# Patient Record
Sex: Female | Born: 1955
Health system: Southern US, Community
[De-identification: ages and names within clinical notes are randomized; demographics above are authoritative.]

## PROBLEM LIST (undated history)

## (undated) DIAGNOSIS — D1800 Hemangioma unspecified site: Secondary | ICD-10-CM

## (undated) DIAGNOSIS — T7840XA Allergy, unspecified, initial encounter: Secondary | ICD-10-CM

## (undated) DIAGNOSIS — D649 Anemia, unspecified: Secondary | ICD-10-CM

## (undated) DIAGNOSIS — T4145XA Adverse effect of unspecified anesthetic, initial encounter: Secondary | ICD-10-CM

## (undated) DIAGNOSIS — E785 Hyperlipidemia, unspecified: Secondary | ICD-10-CM

## (undated) DIAGNOSIS — T8859XA Other complications of anesthesia, initial encounter: Secondary | ICD-10-CM

## (undated) DIAGNOSIS — A6920 Lyme disease, unspecified: Secondary | ICD-10-CM

## (undated) DIAGNOSIS — E213 Hyperparathyroidism, unspecified: Secondary | ICD-10-CM

## (undated) DIAGNOSIS — I10 Essential (primary) hypertension: Secondary | ICD-10-CM

## (undated) DIAGNOSIS — C189 Malignant neoplasm of colon, unspecified: Principal | ICD-10-CM

## (undated) DIAGNOSIS — F419 Anxiety disorder, unspecified: Secondary | ICD-10-CM

## (undated) DIAGNOSIS — I739 Peripheral vascular disease, unspecified: Secondary | ICD-10-CM

## (undated) DIAGNOSIS — I639 Cerebral infarction, unspecified: Secondary | ICD-10-CM

## (undated) HISTORY — PX: FACIAL COSMETIC SURGERY: SHX629

## (undated) HISTORY — DX: Anxiety disorder, unspecified: F41.9

## (undated) HISTORY — DX: Hyperlipidemia, unspecified: E78.5

## (undated) HISTORY — DX: Allergy, unspecified, initial encounter: T78.40XA

## (undated) HISTORY — DX: Hyperparathyroidism, unspecified: E21.3

## (undated) HISTORY — DX: Lyme disease, unspecified: A69.20

## (undated) HISTORY — DX: Anemia, unspecified: D64.9

## (undated) HISTORY — PX: EYE SURGERY: SHX253

## (undated) HISTORY — DX: Hemangioma unspecified site: D18.00

## (undated) HISTORY — PX: COLON SURGERY: SHX602

## (undated) HISTORY — DX: Malignant neoplasm of colon, unspecified: C18.9

---

## 1998-09-18 HISTORY — PX: WISDOM TOOTH EXTRACTION: SHX21

## 2005-10-19 ENCOUNTER — Other Ambulatory Visit: Admission: RE | Admit: 2005-10-19 | Discharge: 2005-10-19 | Payer: Self-pay | Admitting: Gynecology

## 2006-01-17 DIAGNOSIS — C189 Malignant neoplasm of colon, unspecified: Secondary | ICD-10-CM

## 2006-01-17 HISTORY — DX: Malignant neoplasm of colon, unspecified: C18.9

## 2006-01-17 HISTORY — PX: COLON RESECTION: SHX5231

## 2006-09-11 ENCOUNTER — Inpatient Hospital Stay (HOSPITAL_COMMUNITY): Admission: EM | Admit: 2006-09-11 | Discharge: 2006-09-18 | Payer: Self-pay | Admitting: Emergency Medicine

## 2006-09-13 ENCOUNTER — Encounter (INDEPENDENT_AMBULATORY_CARE_PROVIDER_SITE_OTHER): Payer: Self-pay | Admitting: General Surgery

## 2006-10-17 ENCOUNTER — Ambulatory Visit (HOSPITAL_COMMUNITY): Payer: Self-pay | Admitting: Oncology

## 2006-10-17 ENCOUNTER — Encounter (HOSPITAL_COMMUNITY): Admission: RE | Admit: 2006-10-17 | Discharge: 2006-10-17 | Payer: Self-pay | Admitting: Oncology

## 2007-01-16 ENCOUNTER — Encounter (HOSPITAL_COMMUNITY): Admission: RE | Admit: 2007-01-16 | Discharge: 2007-01-17 | Payer: Self-pay | Admitting: Oncology

## 2007-01-16 ENCOUNTER — Ambulatory Visit (HOSPITAL_COMMUNITY): Payer: Self-pay | Admitting: Oncology

## 2007-02-13 ENCOUNTER — Ambulatory Visit (HOSPITAL_COMMUNITY): Admission: RE | Admit: 2007-02-13 | Discharge: 2007-02-13 | Payer: Self-pay | Admitting: General Surgery

## 2007-04-17 ENCOUNTER — Ambulatory Visit (HOSPITAL_COMMUNITY): Payer: Self-pay | Admitting: Oncology

## 2007-04-17 ENCOUNTER — Encounter (HOSPITAL_COMMUNITY): Admission: RE | Admit: 2007-04-17 | Discharge: 2007-05-17 | Payer: Self-pay | Admitting: Oncology

## 2007-07-18 ENCOUNTER — Encounter (HOSPITAL_COMMUNITY): Admission: RE | Admit: 2007-07-18 | Discharge: 2007-08-17 | Payer: Self-pay | Admitting: Oncology

## 2007-07-18 ENCOUNTER — Ambulatory Visit (HOSPITAL_COMMUNITY): Payer: Self-pay | Admitting: Oncology

## 2007-10-17 ENCOUNTER — Encounter (HOSPITAL_COMMUNITY): Admission: RE | Admit: 2007-10-17 | Discharge: 2007-11-16 | Payer: Self-pay | Admitting: Oncology

## 2007-10-17 ENCOUNTER — Ambulatory Visit (HOSPITAL_COMMUNITY): Payer: Self-pay | Admitting: Oncology

## 2008-01-16 ENCOUNTER — Encounter (HOSPITAL_COMMUNITY): Admission: RE | Admit: 2008-01-16 | Discharge: 2008-02-15 | Payer: Self-pay | Admitting: Oncology

## 2008-01-16 ENCOUNTER — Ambulatory Visit (HOSPITAL_COMMUNITY): Payer: Self-pay | Admitting: Oncology

## 2008-01-18 DIAGNOSIS — A6920 Lyme disease, unspecified: Secondary | ICD-10-CM

## 2008-01-18 HISTORY — DX: Lyme disease, unspecified: A69.20

## 2008-05-06 ENCOUNTER — Ambulatory Visit (HOSPITAL_COMMUNITY): Payer: Self-pay | Admitting: Oncology

## 2008-05-06 ENCOUNTER — Encounter (HOSPITAL_COMMUNITY): Admission: RE | Admit: 2008-05-06 | Discharge: 2008-06-05 | Payer: Self-pay | Admitting: Oncology

## 2008-08-05 ENCOUNTER — Ambulatory Visit (HOSPITAL_COMMUNITY): Payer: Self-pay | Admitting: Oncology

## 2008-08-05 ENCOUNTER — Encounter (HOSPITAL_COMMUNITY): Admission: RE | Admit: 2008-08-05 | Discharge: 2008-09-04 | Payer: Self-pay | Admitting: Oncology

## 2008-11-25 ENCOUNTER — Ambulatory Visit (HOSPITAL_COMMUNITY): Payer: Self-pay | Admitting: Oncology

## 2008-11-25 ENCOUNTER — Encounter (HOSPITAL_COMMUNITY): Admission: RE | Admit: 2008-11-25 | Discharge: 2008-12-25 | Payer: Self-pay | Admitting: Oncology

## 2009-02-17 ENCOUNTER — Ambulatory Visit (HOSPITAL_COMMUNITY): Payer: Self-pay | Admitting: Oncology

## 2009-02-17 ENCOUNTER — Encounter (HOSPITAL_COMMUNITY): Admission: RE | Admit: 2009-02-17 | Discharge: 2009-03-19 | Payer: Self-pay | Admitting: Oncology

## 2009-05-19 ENCOUNTER — Encounter (HOSPITAL_COMMUNITY): Admission: RE | Admit: 2009-05-19 | Discharge: 2009-06-18 | Payer: Self-pay | Admitting: Oncology

## 2009-05-19 ENCOUNTER — Ambulatory Visit (HOSPITAL_COMMUNITY): Payer: Self-pay | Admitting: Oncology

## 2009-06-15 IMAGING — CT CT ABDOMEN W/ CM
1 of 3 series · 12 of 32 positions shown, 18 images · non-contrast
Comparison: none

HISTORY: Abdominal pain for 3 weeks

[Series 2: abd_pel 5.0 b40f · axial · 0.69mm/px · z∈[-472,-72]mm · 12 of 96 slices shown, 18 images]
[im 8/96  soft-tissue]
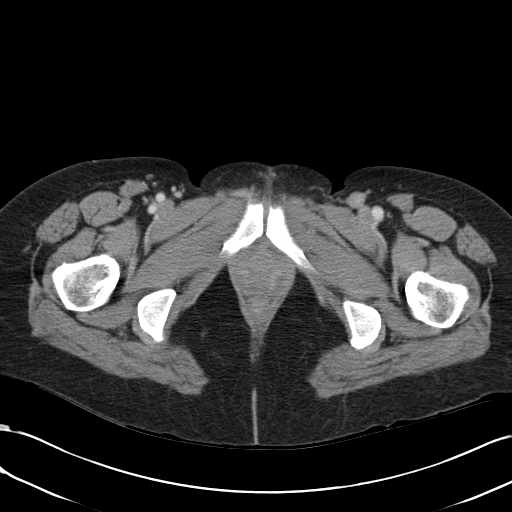
[im 8/96  bone]
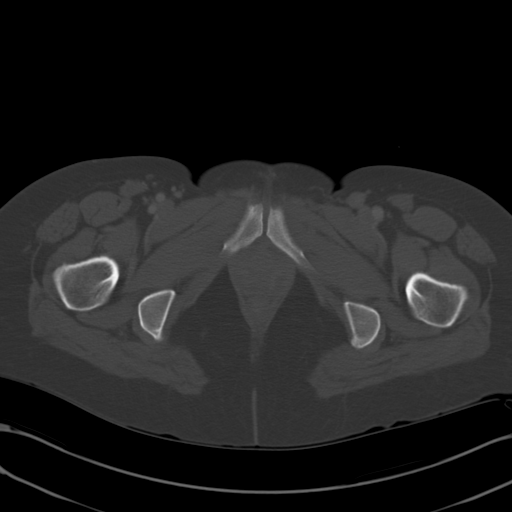
[im 15/96  soft-tissue]
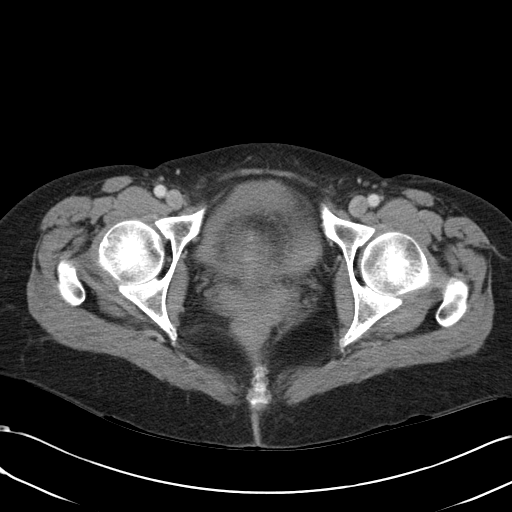
[im 22/96  soft-tissue]
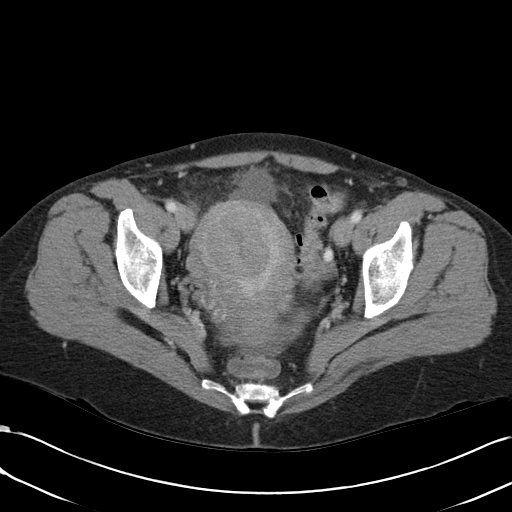
[im 30/96  soft-tissue]
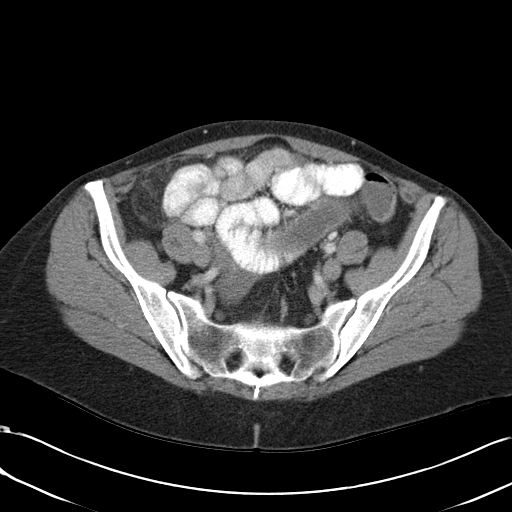
[im 37/96  soft-tissue]
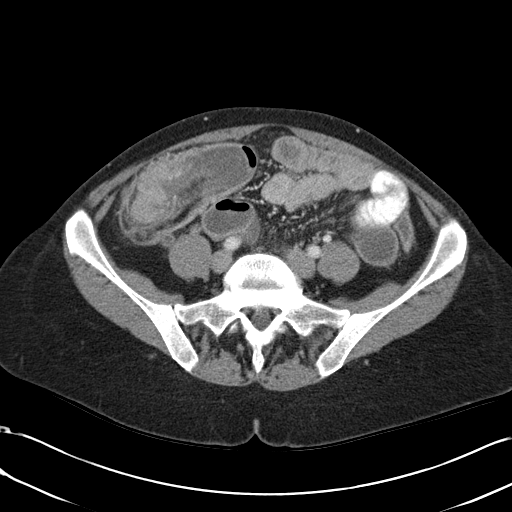
[im 44/96  soft-tissue]
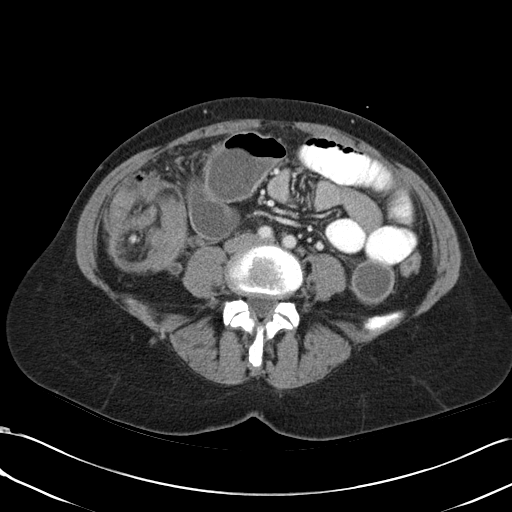
[im 52/96  soft-tissue]
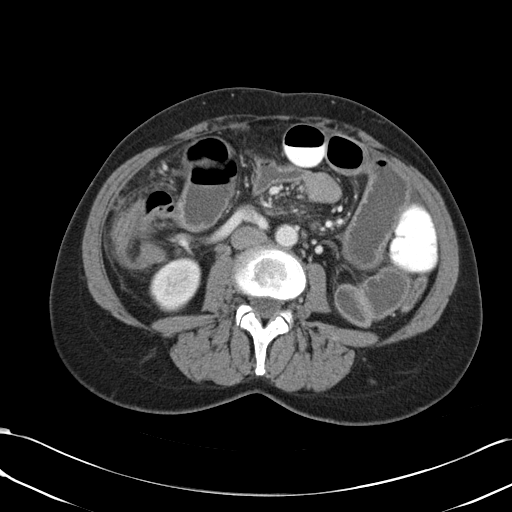
[im 59/96  soft-tissue]
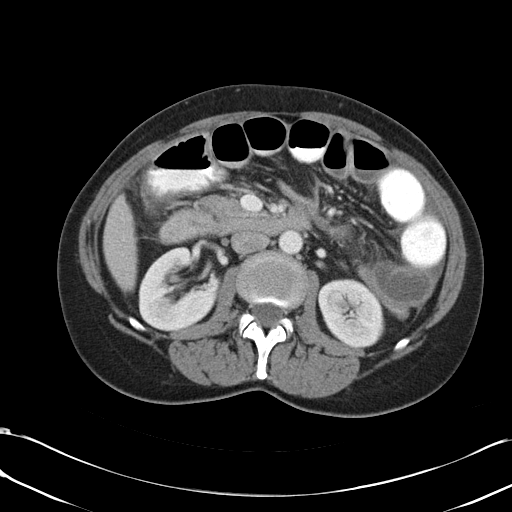
[im 66/96  soft-tissue]
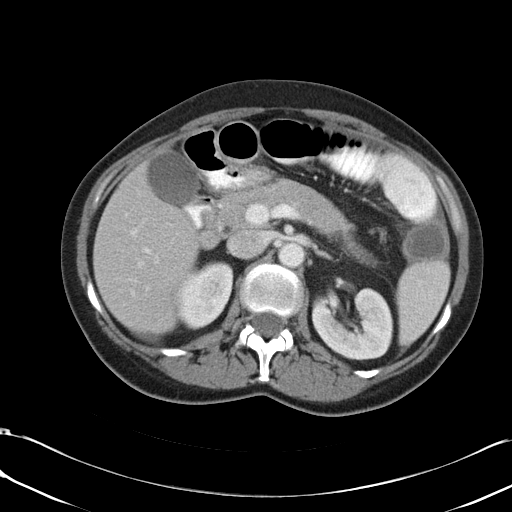
[im 66/96  lung]
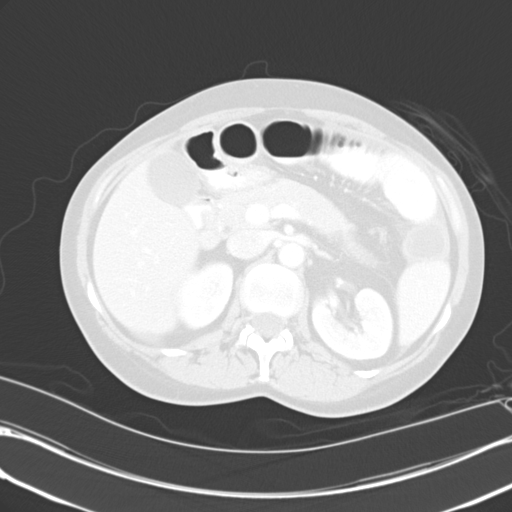
[im 66/96  bone]
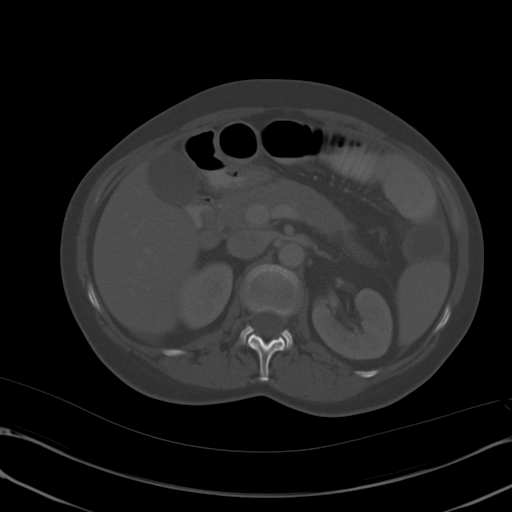
[im 74/96  soft-tissue]
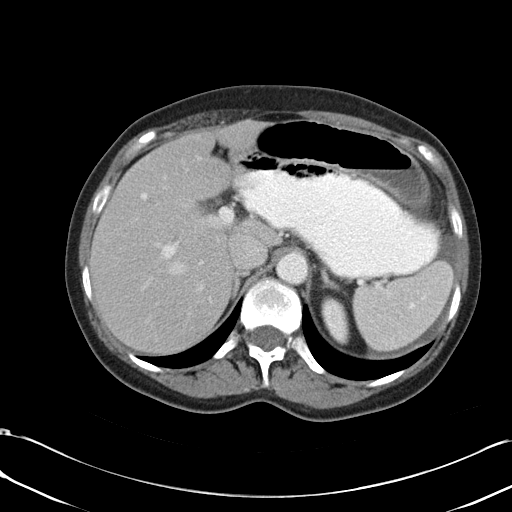
[im 74/96  lung]
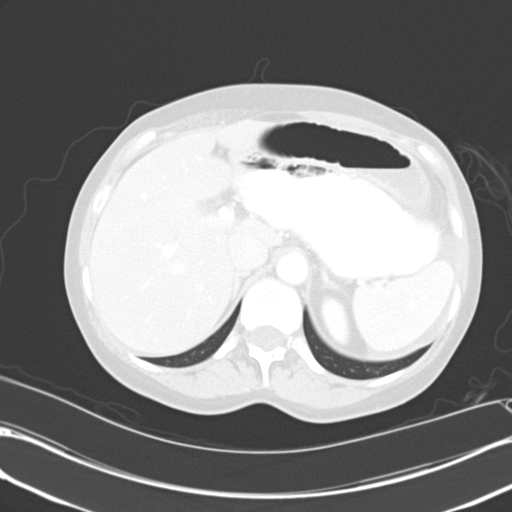
[im 81/96  soft-tissue]
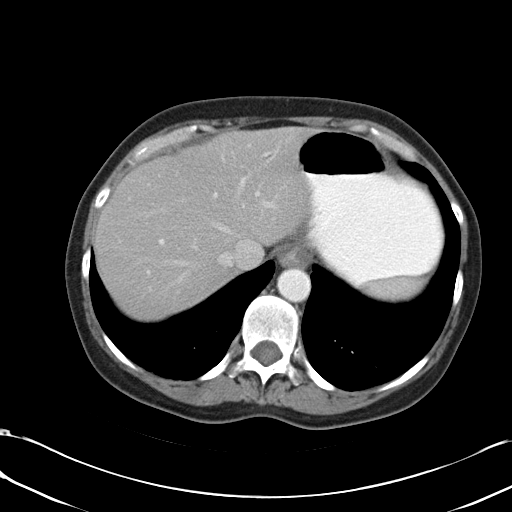
[im 81/96  lung]
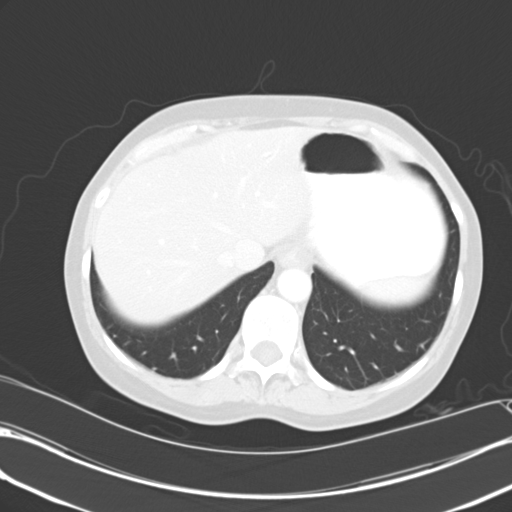
[im 88/96  soft-tissue]
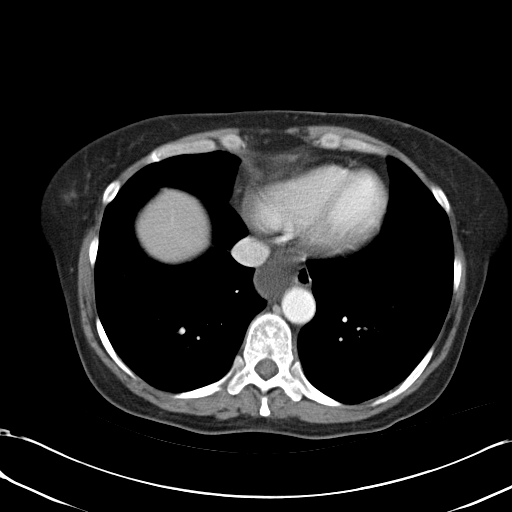
[im 88/96  lung]
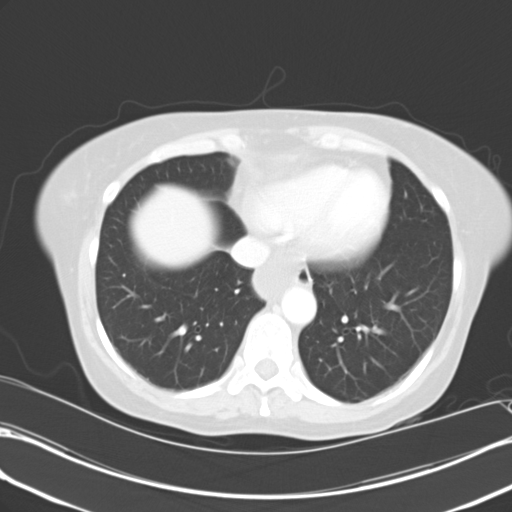

[12 of 32 positions shown; findings below may reference images not displayed]

CT ABDOMEN AND PELVIS WITH CONTRAST:

Multidetector helical CT imaging abdomen and pelvis performed.
Sagittal and coronal images are reconstructed from the axial data set.
Exam utilized dilute oral contrast and 100 cc Vmnipaque-1YY.
No prior exam for comparison.

CT ABDOMEN:

Lung bases clear.
Fluid attenuation mass identified in the inferior mediastinum, 4.5 x 2.6 x
cm, question pericardial cyst, bronchogenic cyst, foregut duplication cyst.
Liver, spleen, pancreas, kidneys, and adrenal glands normal.
Dilated small bowel loops with scattered minimal bowel wall thickening of distal
jejunal and ileal loops.
"Mass" identified within ascending colon to proximal transverse colon,
consisting of fat, bowel, and mesenteric vessels, compatible with ileocolic
intussusception.
Appendix appears dilated with enhancing wall but this is felt to be secondary to
obstruction from intussusception.
Appendix measures 9 mm transverse.
A few small normal sized reactive lymph nodes.
Small amounts of free fluid between bowel loops in upper abdomen.
No evidence of free air or focal abscess collection.
Visualized vascular structures appear patent.
IMPRESSION: Ileocolic intussusception with question mass versus cyst versus edematous bowel
at lead point in proximal transverse colon.
Secondary small bowel obstruction with mild bowel wall thickening, question
secondary to obstruction or potentially developing ischemia.
Probable secondary appendiceal obstruction due to intussusception, simulating
appendicitis.
Inferior mediastinal mass, fluid attenuation, question bronchogenic cyst,
pericardial cyst, dislocation cyst.
Findings discussed with Dr. Ionuc and Dr. Kuvera.

CT PELVIS:

Ileocolic intussusception as noted previously.
Small moderate amount of free pelvic fluid.
Soft tissue mass in right fundal aspect of uterus, likely large leiomyoma,
cm greatest size.
Fluid seen within endometrial cavity as well, though endometrial canal is
distorted by the uterine fibroid and uterine morphology is uncertain.
Bladder unremarkable.
Bilateral pelvic phleboliths.
Distal colon decompressed.
No pelvic adenopathy or additional soft tissue mass.
No acute bone lesions.
IMPRESSION: Small to moderate amount of free pelvic fluid.
Ileocolic intussusception and secondary small bowel obstruction as noted above.
Uterine mass likely large fibroid 5.2 cm greatest size.

## 2009-07-21 ENCOUNTER — Ambulatory Visit (HOSPITAL_COMMUNITY): Admission: RE | Admit: 2009-07-21 | Discharge: 2009-07-21 | Payer: Self-pay | Admitting: Family Medicine

## 2009-08-19 ENCOUNTER — Ambulatory Visit (HOSPITAL_COMMUNITY): Payer: Self-pay | Admitting: Oncology

## 2009-08-19 ENCOUNTER — Encounter (HOSPITAL_COMMUNITY): Admission: RE | Admit: 2009-08-19 | Discharge: 2009-09-18 | Payer: Self-pay | Admitting: Oncology

## 2009-11-12 ENCOUNTER — Ambulatory Visit (HOSPITAL_COMMUNITY): Payer: Self-pay | Admitting: Oncology

## 2009-11-12 ENCOUNTER — Encounter (HOSPITAL_COMMUNITY)
Admission: RE | Admit: 2009-11-12 | Discharge: 2009-12-12 | Payer: Self-pay | Source: Home / Self Care | Admitting: Oncology

## 2010-03-16 ENCOUNTER — Other Ambulatory Visit (HOSPITAL_COMMUNITY): Payer: Self-pay

## 2010-03-16 ENCOUNTER — Encounter (HOSPITAL_COMMUNITY): Payer: Self-pay | Attending: Oncology

## 2010-03-16 DIAGNOSIS — C180 Malignant neoplasm of cecum: Secondary | ICD-10-CM

## 2010-03-16 DIAGNOSIS — Z85038 Personal history of other malignant neoplasm of large intestine: Secondary | ICD-10-CM | POA: Insufficient documentation

## 2010-03-30 LAB — RPR: RPR Ser Ql: NONREACTIVE

## 2010-03-30 LAB — VITAMIN B12: Vitamin B-12: 618 pg/mL (ref 211–911)

## 2010-03-30 LAB — TSH: TSH: 3.382 u[IU]/mL (ref 0.350–4.500)

## 2010-03-31 LAB — CBC
HCT: 42.2 % (ref 36.0–46.0)
MCV: 91.8 fL (ref 78.0–100.0)
Platelets: 200 10*3/uL (ref 150–400)
RBC: 4.59 MIL/uL (ref 3.87–5.11)
WBC: 5.3 10*3/uL (ref 4.0–10.5)

## 2010-03-31 LAB — COMPREHENSIVE METABOLIC PANEL
AST: 20 U/L (ref 0–37)
Albumin: 4 g/dL (ref 3.5–5.2)
BUN: 15 mg/dL (ref 6–23)
Calcium: 10.3 mg/dL (ref 8.4–10.5)
Chloride: 104 mEq/L (ref 96–112)
Creatinine, Ser: 0.78 mg/dL (ref 0.4–1.2)
GFR calc non Af Amer: >60 mL/min (ref >60)
Sodium: 139 mEq/L (ref 135–145)

## 2010-03-31 LAB — FERRITIN: Ferritin: 50 ng/mL (ref 10–291)

## 2010-04-02 LAB — CEA: CEA: 0.8 ng/mL (ref 0.0–5.0)

## 2010-04-06 LAB — CEA: CEA: 0.6 ng/mL (ref 0.0–5.0)

## 2010-04-07 LAB — CEA: CEA: 0.6 ng/mL (ref 0.0–5.0)

## 2010-04-21 LAB — COMPREHENSIVE METABOLIC PANEL WITH GFR
ALT: 22 U/L (ref 0–35)
AST: 23 U/L (ref 0–37)
Albumin: 4.2 g/dL (ref 3.5–5.2)
Alkaline Phosphatase: 54 U/L (ref 39–117)
BUN: 13 mg/dL (ref 6–23)
CO2: 30 meq/L (ref 19–32)
Calcium: 11.2 mg/dL — ABNORMAL HIGH (ref 8.4–10.5)
Chloride: 105 meq/L (ref 96–112)
Creatinine, Ser: 0.74 mg/dL (ref 0.4–1.2)
GFR calc non Af Amer: >60 mL/min
Glucose, Bld: 75 mg/dL (ref 70–99)
Potassium: 3.8 meq/L (ref 3.5–5.1)
Sodium: 141 meq/L (ref 135–145)
Total Bilirubin: 0.6 mg/dL (ref 0.3–1.2)
Total Protein: 7.3 g/dL (ref 6.0–8.3)

## 2010-04-21 LAB — CBC
MCV: 90.2 fL (ref 78.0–100.0)
Platelets: 196 10*3/uL (ref 150–400)
RBC: 4.57 MIL/uL (ref 3.87–5.11)
RDW: 13.8 % (ref 11.5–15.5)

## 2010-04-21 LAB — FERRITIN: Ferritin: 52 ng/mL (ref 10–291)

## 2010-04-21 LAB — CEA: CEA: 0.7 ng/mL (ref 0.0–5.0)

## 2010-05-17 ENCOUNTER — Ambulatory Visit (HOSPITAL_COMMUNITY)
Admission: RE | Admit: 2010-05-17 | Discharge: 2010-05-17 | Disposition: A | Discharge status: Home / Self Care | Payer: Self-pay | Source: Ambulatory Visit | Attending: Family Medicine | Admitting: Family Medicine

## 2010-05-17 ENCOUNTER — Other Ambulatory Visit: Payer: Self-pay | Admitting: Family Medicine

## 2010-05-17 DIAGNOSIS — R52 Pain, unspecified: Secondary | ICD-10-CM

## 2010-05-17 DIAGNOSIS — M25473 Effusion, unspecified ankle: Secondary | ICD-10-CM | POA: Insufficient documentation

## 2010-05-17 DIAGNOSIS — M25476 Effusion, unspecified foot: Secondary | ICD-10-CM | POA: Insufficient documentation

## 2010-05-17 DIAGNOSIS — R609 Edema, unspecified: Secondary | ICD-10-CM

## 2010-05-17 DIAGNOSIS — M25579 Pain in unspecified ankle and joints of unspecified foot: Secondary | ICD-10-CM | POA: Insufficient documentation

## 2010-05-17 DIAGNOSIS — M7730 Calcaneal spur, unspecified foot: Secondary | ICD-10-CM | POA: Insufficient documentation

## 2010-06-01 NOTE — Discharge Summary (Signed)
[name], [name] NO.:  [id number]   MEDICAL RECORD NO.:  [id number]          PATIENT TYPE:  INP   LOCATION:  A329                          FACILITY:  [facility]   PHYSICIAN:  Dalia Heading, M.D.  DATE OF BIRTH:  [birth year]   DATE OF ADMISSION:  09/11/2006  DATE OF DISCHARGE:  08/31/2008LH                               DISCHARGE SUMMARY   HOSPITAL COURSE:  The patient is a 55 year old, white female who  presented to the emergency room with a 3-week history of worsening right-  sided abdominal pain.  CT scan of the abdomen and pelvis was performed  which revealed an ileocolic intussusception with a questioned mass in  the cecum.  The patient was also hypokalemic.  She was admitted to the  hospital and hydrated and had her potassium supplemented.  She  subsequently was taken to the operating room on September 13, 2006, and  underwent a laparoscopic, hand-assisted right hemicolectomy.  A tumor  was found at the base of the cecum.  Her postoperative course has been  for the most part unremarkable.  Her diet was advanced without  difficulty once her bowel function fully returned.  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  Her preoperative CEA level was less  than 0.5.   DISPOSITION:  The patient is being discharged home on postop day #4 in  good and improving condition.   FOLLOW UP:  The patient is to follow up Dr. Franky Macho on September 21, 2006.   DISCHARGE MEDICATIONS:  1. Vicodin 1-2 tablets p.o. q.4 h. p.r.n. pain.  2. Vasotec 5 mg p.o. daily.   DISCHARGE DIAGNOSES:  1. Adenocarcinoma of the cecum.  2. Intussusception.  3. Hypokalemia, resolved.  4. Anemia secondary to surgery.  5. Hypertension.   PROCEDURE:  Laparoscopic, hand-assisted right hemicolectomy on September 13, 2006.      Dalia Heading, M.D.  Electronically Signed     MAJ/MEDQ  D:  09/17/2006  T:   09/18/2006  Job:  [id number]

## 2010-06-01 NOTE — Op Note (Signed)
Kristin [name], Kristin [name]               ACCOUNT NO.:  [id number]   MEDICAL RECORD NO.:  [id number]          PATIENT TYPE:  INP   LOCATION:  A329                          FACILITY:  [facility]   PHYSICIAN:  Dalia Heading, M.D.  DATE OF BIRTH:  08/30/1955   DATE OF PROCEDURE:  09/13/2006  DATE OF DISCHARGE:                               OPERATIVE REPORT   PREOPERATIVE DIAGNOSIS:  Intussusception, cecal mass.   POSTOPERATIVE DIAGNOSIS:  Intussusception, cecal mass.   PROCEDURE:  Laparoscopic, hand assisted right hemicolectomy.   SURGEON:  Dr. Franky Macho.   ASSISTANT:  Dr. Tilford Pillar.   ANESTHESIA:  General endotracheal.   INDICATIONS:  The patient is a 55 year old white female who presented to  the emergency room with a 3-week history of worsening right-sided  abdominal pain.  CT scan of the abdomen and pelvis revealed an ileocolic  intussusception with a question of a colon mass as the lead point.  The  patient comes the operating room for laparoscopic right hemicolectomy.  Risks and benefits of the procedure including bleeding, infection,  cardiopulmonary difficulties, the possibility of blood transfusion, and  the possibly of an open procedure were fully explained to the patient,  who gave informed consent.   PROCEDURE NOTE:  The patient is placed in the low lithotomy position  after induction of general endotracheal anesthesia.  The abdomen was  prepped and draped in the usual sterile technique with Betadine.  Surgical site confirmation was performed.   A Pfannenstiel incision was made.  A GelPort was then inserted.  An 11-  mm trocar was then placed suprapubically under direct palpation and  another 11-mm trocar was placed left lower quadrant.  The abdomen was  then insufflated to 17 mmHg pressure.  On inspection, the mass was noted  within the cecum.  This was causing inflammation around the cecum.  The  mesentery was noted to have multiple palpable lymph nodes present.   The  liver was inspected, noted to normal limits.  The gallbladder was  normal.  The nasogastric tube was noted be in appropriate position in  the stomach.  The terminal ileum was inspected.  There was no evidence  of Meckel's diverticulum.  The right colon was then mobilized along the  peritoneal reflection.  The hepatic flexure was then taken down using  the LigaSure.  Once the right colon and proximal transverse colon were  fully mobilized, they were exteriorized.  A GIA stapler was placed  across the proximal transverse colon as well as the terminal ileum and  fired.  The mesentery was then divided using the LigaSure.  The right  colic artery and vein were suture ligated using 2-0 silk suture  ligatures x2.  The specimen was then removed from the operative field.  A side-to-side ileocolic anastomosis was then performed using a GIA 70  stapler.  The enterotomy was closed using a TA-60 stapler.  The staple  line was bolstered using 3-0 silk sutures.  The mesenteric defect was  closed using 3-0 silk interrupted sutures.  The specimen was then  dropped back into the abdominal  cavity.  There was then insufflated.  The mesentery was then inspected and no hematomas were noted.  The  anastomosis was noted be widely patent.  No tension was noted on the  anastomosis.  The abdominal cavity was then irrigated with normal  saline.  All fluid and air were then evacuated from the abdominal cavity  prior to removal of the trocars.   All wounds were irrigated normal saline.  All wounds were injected with  0.5% Sensorcaine.  The Pfannenstiel peritoneal layer was closed using a  0 chromic gut running suture.  The fascia was reapproximated using 0  Vicryl interrupted sutures.  The subcutaneous layer was closed using 3-0  Vicryl interrupted sutures.  The skin was closed using staples.  The  other two incisions were also closed using staples.  0.5 cm Sensorcaine  was instilled in the surrounding  incisions.  Betadine ointment and dry  sterile dressings were applied.   All tape and needle counts correct at end of the procedure.  The patient  was extubated in the operating room and went back to recovery room awake  in stable condition.   COMPLICATIONS:  None.   SPECIMEN:  Right colon and terminal ileum.   BLOOD LOSS:  150 mL.      Dalia Heading, M.D.  Electronically Signed     MAJ/MEDQ  D:  09/13/2006  T:  09/14/2006  Job:  [id number]

## 2010-06-01 NOTE — H&P (Signed)
NAMESHELBI, [name]               ACCOUNT NO.:  [id number]   MEDICAL RECORD NO.:  [id number]          PATIENT TYPE:  INP   LOCATION:  A329                          FACILITY:  [facility]   PHYSICIAN:  Dalia Heading, M.D.  DATE OF BIRTH:  [birth year]   DATE OF ADMISSION:  09/11/2006  DATE OF DISCHARGE:  LH                              HISTORY & PHYSICAL   CHIEF COMPLAINT:  Ileocolic intussusception.   HISTORY OF PRESENT ILLNESS:  The patient is a 55 year old white female  who presents with intermittent episodes of right lower quadrant  abdominal pain.  This has been occurring over the past 3 weeks.  She has  had some emesis and nausea with this.  She presented to an Urgent Care  Center and then was referred to [facility] for further evaluation and  treatment.  She was found on CT scan of the abdomen and pelvis to have  an ileocolic intussusception.  It is difficult ascertain whether there  is a mass as the lead point of the intussusception.  A small amount of  free fluid is present.  Mild small bowel wall thickening is noted,  though there was no evidence of obstructive symptoms.   PAST MEDICAL HISTORY:  Unremarkable.   PAST SURGICAL HISTORY:  Unremarkable.   CURRENT MEDICATIONS:  None.   ALLERGIES:  No known drug allergies.   REVIEW OF SYSTEMS:  Noncontributory.   PHYSICAL EXAMINATION:  The patient is a well-developed, well-nourished  white female in no acute distress.  LUNGS:  Clear to auscultation with equal breath sounds bilaterally.  HEART:  Regular rate and rhythm without S3, S4, or murmurs.  ABDOMEN:  Soft, nontender, nondistended.  No specific mass was noted,  though there was some fullness along the right paracolic gutter.  No  hepatosplenomegaly, rigidity, or hernias are noted.  Rectal exam done previously was unremarkable.   White blood cell count 12.7, hematocrit 36, platelet count 394.  Met-7  was remarkable for a potassium of 2.9.  Liver profile was within normal  limits.  Chest x-ray is unremarkable.  A 12-lead EKG reveals normal  sinus rhythm without acute ischemic changes.   IMPRESSION:  1. Ileocolic intussusception.  2. Hypokalemia.   PLAN:  The patient will be given potassium supplementation prior to  surgery.  Her pain has somewhat subsided with Dilaudid.  She  subsequently will undergo a laparoscopic right hemicolectomy.      Dalia Heading, M.D.  Electronically Signed     MAJ/MEDQ  D:  09/11/2006  T:  09/12/2006  Job:  [id number]

## 2010-06-01 NOTE — H&P (Signed)
Kristin [name], Kristin [name]               ACCOUNT NO.:  [id number]   MEDICAL RECORD NO.:  [id number]          PATIENT TYPE:  AMB   LOCATION:  DAY                           FACILITY:  [facility]   PHYSICIAN:  Dalia Heading, M.D.  DATE OF BIRTH:  03-17-1955   DATE OF ADMISSION:  02/13/2007  DATE OF DISCHARGE:  LH                              HISTORY & PHYSICAL   CHIEF COMPLAINT:  Colon carcinoma, need for followup colonoscopy.   HISTORY OF PRESENT ILLNESS:  The patient is a 55 year old white female  who underwent a right hemicolectomy in August 2008 for colon carcinoma  who now presents for followup colonoscopy.  She denies any abdominal  pain, weight loss, nausea, vomiting, diarrhea, constipation, or melena.  She did not have a colonoscopy prior to her previous surgery due to the  emergent nature of the surgery.  There is no family history of colon  carcinoma.   PAST MEDICAL HISTORY:  As noted above.   PAST SURGICAL HISTORY:  As noted above.   CURRENT MEDICATIONS:  None   ALLERGIES:  No known drug allergies.   REVIEW OF SYSTEMS:  Noncontributory.   PHYSICAL EXAMINATION:  GENERAL:  The patient is a well-developed, well-  nourished white female in no acute distress.  LUNGS:  Clear to auscultation with equal breath sounds bilaterally.  HEART:  Examination reveals regular rate and rhythm without S3, S4, or  murmurs.  ABDOMEN:  The abdomen is soft, nontender, nondistended.  No  hepatosplenomegaly, masses, hernias are noted.  RECTAL:  Examination was deferred to the procedure.   IMPRESSION:  Colon carcinoma.   PLAN:  The patient is scheduled for colonoscopy on February 13, 2007.  Risks and benefits of the procedure including bleeding and perforation  were fully explained to the patient, gave informed consent.      Dalia Heading, M.D.  Electronically Signed     MAJ/MEDQ  D:  02/06/2007  T:  02/06/2007  Job:  [id number]   cc:   [facility]  Fax: [phone]   Ladona Horns.  Mariel Sleet, MD  Fax: [phone]

## 2010-06-02 ENCOUNTER — Other Ambulatory Visit (HOSPITAL_COMMUNITY): Payer: Self-pay | Admitting: Oncology

## 2010-06-02 ENCOUNTER — Encounter (HOSPITAL_COMMUNITY): Payer: Self-pay | Attending: Oncology

## 2010-06-02 DIAGNOSIS — C180 Malignant neoplasm of cecum: Secondary | ICD-10-CM

## 2010-06-02 DIAGNOSIS — Z85038 Personal history of other malignant neoplasm of large intestine: Secondary | ICD-10-CM | POA: Insufficient documentation

## 2010-06-02 LAB — CEA: CEA: 0.8 ng/mL (ref 0.0–5.0)

## 2010-09-02 ENCOUNTER — Encounter (HOSPITAL_COMMUNITY): Payer: Self-pay | Admitting: Oncology

## 2010-09-02 ENCOUNTER — Other Ambulatory Visit (HOSPITAL_COMMUNITY): Payer: Self-pay | Admitting: Oncology

## 2010-09-02 ENCOUNTER — Encounter (HOSPITAL_COMMUNITY): Payer: Self-pay | Attending: Oncology

## 2010-09-02 DIAGNOSIS — C189 Malignant neoplasm of colon, unspecified: Secondary | ICD-10-CM

## 2010-09-02 DIAGNOSIS — Z139 Encounter for screening, unspecified: Secondary | ICD-10-CM

## 2010-09-02 DIAGNOSIS — Z85038 Personal history of other malignant neoplasm of large intestine: Secondary | ICD-10-CM | POA: Insufficient documentation

## 2010-09-02 DIAGNOSIS — A6920 Lyme disease, unspecified: Secondary | ICD-10-CM

## 2010-09-02 LAB — CEA: CEA: 0.8 ng/mL (ref 0.0–5.0)

## 2010-09-02 NOTE — Progress Notes (Signed)
Labs drawn today for cea 

## 2010-09-24 ENCOUNTER — Ambulatory Visit (HOSPITAL_COMMUNITY)
Admission: RE | Admit: 2010-09-24 | Discharge: 2010-09-24 | Disposition: A | Discharge status: Home / Self Care | Payer: Self-pay | Source: Ambulatory Visit | Attending: Oncology | Admitting: Oncology

## 2010-09-24 DIAGNOSIS — Z139 Encounter for screening, unspecified: Secondary | ICD-10-CM

## 2010-09-24 DIAGNOSIS — Z1231 Encounter for screening mammogram for malignant neoplasm of breast: Secondary | ICD-10-CM | POA: Insufficient documentation

## 2010-10-07 NOTE — H&P (Signed)
NTS SOAP Note  Vital Signs:  Vitals as of: 10/07/2010: Systolic 156: Diastolic 88: Heart Rate 70: Temp 97.11F: Height 51ft 6in: Weight 167Lbs 0 Ounces: Pain Level 0: BMI 27  BMI : 26.95 kg/m2  Subjective: This 50 Years 86 Months old Female presents for of scheduling of follow up TCS. Had right hemicolectomy in 2008 for colon carcinoma. TCS in 2009 negative. Denies GI complaints.  Review of Symptoms:  Constitutional: unremarkable  Head: unremarkable  Eyes:unremarkable  Nose/Mouth/Throat:unremarkable  Cardiovascular:unremarkable  Respiratory: unremarkable  Gastrointestinal:unremarkable  Genitourinary: unremarkable  Musculoskeletal: unremarkable  Skin: unremarkable  Hematolgic/Lymphatic: unremarkable  Allergic/Immunologic:unremarkable     Past Medical History:Reviewed  Past Medical History  Surgical History: right hemicolectomy in 2008 Medical Problems: T3N0M0 colon carcinoma Allergies: nkda Medications: vitamens   Social History: Reviewed   Social History  Preferred Language: English ([place]) Race: White Ethnicity: Not Hispanic / Latino Age: 55 Years 4 Months Marital Status: M Alcohol: No Recreational drug(s): No   Smoking Status: Never smoker reviewed on 10/07/2010  Family History: Reviewed  Family History  Is there a family history of:No family h/o colon carcinoma   Medication Allergies:   Allergies Insert Code:   Objective Information:  General: Well appearing, well nourished in no distress.  Skin:no rash or prominent lesions  Head: Atraumatic; no masses; no abnormalities  Neck: Supple without lymphadenopathy.  Heart: RRR, no murmur or gallop. Normal S1, S2. No S3, S4.  Lungs:CTA bilaterally, no wheezes, rhonchi, rales. Breathing unlabored.  Abdomen: Soft, NT/ND, no HSM, no masses.  deferred to procedure  Assessment: H/o colon carcinoma  Diagnosis & Procedure: DiagnosisCode: 153.6, ProcedureCode: 16109,   Orders:sample of sureprep  given     Plan:Scheduled for TCS on 10/12/10.    Patient Education: Alternative treatments to surgery were discussed with patient (and family).Risks and benefits of procedure were fully explained to the patient (and family) who gave informed consent. Patient/family questions were addressed.  Follow-up: Pending Surgery

## 2010-10-11 MED ORDER — SODIUM CHLORIDE 0.45 % IV SOLN
Freq: Once | INTRAVENOUS | Status: AC
  Administered 2010-10-12: 09:00:00 via INTRAVENOUS

## 2010-10-12 ENCOUNTER — Encounter (HOSPITAL_COMMUNITY): Payer: Self-pay | Admitting: *Deleted

## 2010-10-12 ENCOUNTER — Ambulatory Visit (HOSPITAL_COMMUNITY)
Admission: RE | Admit: 2010-10-12 | Discharge: 2010-10-12 | Disposition: A | Discharge status: Home / Self Care | Payer: Self-pay | Source: Ambulatory Visit | Attending: General Surgery | Admitting: General Surgery

## 2010-10-12 ENCOUNTER — Encounter (HOSPITAL_COMMUNITY)
Admission: RE | Disposition: A | Discharge status: Home / Self Care | Payer: Self-pay | Source: Ambulatory Visit | Attending: General Surgery

## 2010-10-12 DIAGNOSIS — Z9049 Acquired absence of other specified parts of digestive tract: Secondary | ICD-10-CM | POA: Insufficient documentation

## 2010-10-12 DIAGNOSIS — Z1211 Encounter for screening for malignant neoplasm of colon: Secondary | ICD-10-CM | POA: Insufficient documentation

## 2010-10-12 DIAGNOSIS — Z85038 Personal history of other malignant neoplasm of large intestine: Secondary | ICD-10-CM | POA: Insufficient documentation

## 2010-10-12 HISTORY — PX: COLONOSCOPY: SHX5424

## 2010-10-12 SURGERY — COLONOSCOPY
Anesthesia: Moderate Sedation

## 2010-10-12 MED ORDER — STERILE WATER FOR IRRIGATION IR SOLN
Status: DC | PRN
  Administered 2010-10-12: 10:00:00

## 2010-10-12 MED ORDER — MIDAZOLAM HCL 5 MG/5ML IJ SOLN
INTRAMUSCULAR | Status: AC
  Filled 2010-10-12: qty 10

## 2010-10-12 MED ORDER — MEPERIDINE HCL 100 MG/ML IJ SOLN
INTRAMUSCULAR | Status: AC
  Filled 2010-10-12: qty 1

## 2010-10-12 MED ORDER — MEPERIDINE HCL 25 MG/ML IJ SOLN
INTRAMUSCULAR | Status: DC | PRN
  Administered 2010-10-12: 50 mg via INTRAVENOUS

## 2010-10-12 MED ORDER — MIDAZOLAM HCL 5 MG/5ML IJ SOLN
INTRAMUSCULAR | Status: DC | PRN
  Administered 2010-10-12: 4 mg via INTRAVENOUS

## 2010-10-12 NOTE — Op Note (Signed)
COLONOSCOPY PROCEDURE REPORT  PATIENT: Kristin [name], Mccaig MR#: [id number]  BIRTHDATE: November 16, 1955, 55 yrs. old GENDER: female ENDOSCOPIST: Franky Macho, MD REF. BY: Simone Curia, M.D.  PROCEDURE DATE:  10/12/2010 PROCEDURE: Higher-risk screening colonoscopy G0105   ASA CLASS: Class II INDICATIONS: history of colon cancer   MEDICATIONS:  Versed 4 mg IV, demerol 50 mg IV  DESCRIPTION OF PROCEDURE:   After the risks benefits and alternatives of the procedure were thoroughly explained, informed consent was obtained.  Digital rectal exam was performed and revealed no abnormalities.   The EC-3890li ([id number]) endoscope was introduced through the anus and advanced to the anastomosis, without limitations.  The quality of the prep was excellent..  The instrument was then slowly withdrawn as the colon was fully examined.   FINDINGS:  There was a surgical anastomosis in the mid transverse colon (see image001).  Anastomosis widely patent without evidence of stricture or recurrent malignancy.  The transverse, splenic flexure, descending, sigmoid colon, and rectum appeared unremarkable.   Retroflexed views in the rectum revealed no abnormalities.    The time to cecum =  minutes. The scope was then withdrawn in  minutes from the cecum and the procedure completed. COMPLICATIONS: None ENDOSCOPIC IMPRESSION:  1) Anastomosis in the mid transverse colon   2) Normal colon  RECOMMENDATIONS:    REPEAT EXAM: In 5 year(s) for Colonoscopy.   _______________________________ Franky Macho, MD  CC: Simone Curia, Md    n. Rosalie DoctorFranky Macho at 10/12/2010 09:45 AM

## 2010-10-18 LAB — DIFFERENTIAL
Lymphocytes Relative: 16
Lymphs Abs: 1.2
Monocytes Absolute: 0.5
Monocytes Relative: 7
Neutro Abs: 5.6

## 2010-10-18 LAB — COMPREHENSIVE METABOLIC PANEL
Albumin: 3.8
BUN: 12
Calcium: 10.3
Creatinine, Ser: 0.73
Potassium: 3.9
Total Protein: 6.9

## 2010-10-18 LAB — CBC
MCHC: 33.9
MCV: 89.7
Platelets: 269
RDW: 15
WBC: 7.4

## 2010-10-18 LAB — CEA: CEA: 0.9

## 2010-10-20 ENCOUNTER — Encounter (HOSPITAL_COMMUNITY): Payer: Self-pay | Admitting: General Surgery

## 2010-10-22 LAB — CEA: CEA: 0.5

## 2010-10-28 LAB — CBC
HCT: 36.3
MCV: 76 — ABNORMAL LOW
RBC: 4.78
WBC: 7.4

## 2010-10-29 LAB — PREPARE RBC (CROSSMATCH)

## 2010-10-29 LAB — URINE MICROSCOPIC-ADD ON

## 2010-10-29 LAB — DIFFERENTIAL
Basophils Absolute: 0
Basophils Relative: 0
Eosinophils Absolute: 0.1
Eosinophils Absolute: 0.1
Eosinophils Absolute: 0.1
Eosinophils Absolute: 0.2
Eosinophils Relative: 1
Eosinophils Relative: 1
Eosinophils Relative: 1
Eosinophils Relative: 2
Lymphocytes Relative: 15
Lymphocytes Relative: 7 — ABNORMAL LOW
Lymphocytes Relative: 9 — ABNORMAL LOW
Lymphs Abs: 0.9
Lymphs Abs: 0.9
Lymphs Abs: 0.9
Lymphs Abs: 0.9
Lymphs Abs: 1
Lymphs Abs: 1.1
Monocytes Absolute: 0.7
Monocytes Absolute: 0.9 — ABNORMAL HIGH
Monocytes Absolute: 1.1 — ABNORMAL HIGH
Monocytes Absolute: 1.1 — ABNORMAL HIGH
Monocytes Relative: 11
Monocytes Relative: 11
Monocytes Relative: 11
Monocytes Relative: 11
Monocytes Relative: 9
Neutro Abs: 8 — ABNORMAL HIGH
Neutro Abs: 8.4 — ABNORMAL HIGH
Neutrophils Relative %: 76
Neutrophils Relative %: 78 — ABNORMAL HIGH
Neutrophils Relative %: 80 — ABNORMAL HIGH

## 2010-10-29 LAB — PHOSPHORUS
Phosphorus: 2.8
Phosphorus: 3.7

## 2010-10-29 LAB — CBC
HCT: 28 — ABNORMAL LOW
HCT: 30.5 — ABNORMAL LOW
HCT: 31.3 — ABNORMAL LOW
Hemoglobin: 10 — ABNORMAL LOW
Hemoglobin: 10.2 — ABNORMAL LOW
Hemoglobin: 11.3 — ABNORMAL LOW
Hemoglobin: 8.9 — ABNORMAL LOW
Hemoglobin: 9.9 — ABNORMAL LOW
MCHC: 32
MCHC: 32.3
MCHC: 32.4
MCHC: 32.8
MCV: 72.6 — ABNORMAL LOW
MCV: 73.4 — ABNORMAL LOW
MCV: 74.3 — ABNORMAL LOW
Platelets: 299
Platelets: 394
RBC: 4.1
RBC: 4.19
RBC: 4.94
RDW: 16.7 — ABNORMAL HIGH
RDW: 17.4 — ABNORMAL HIGH
WBC: 10.1
WBC: 12.7 — ABNORMAL HIGH
WBC: 6.9
WBC: 8.6

## 2010-10-29 LAB — COMPREHENSIVE METABOLIC PANEL
ALT: 13
ALT: 14
ALT: 16
AST: 14
AST: 16
AST: 22
Albumin: 2.2 — ABNORMAL LOW
Albumin: 3.5
BUN: 3 — ABNORMAL LOW
CO2: 23
CO2: 28
Calcium: 8.6
Calcium: 9.1
Calcium: 9.3
Calcium: 9.6
Chloride: 104
Chloride: 106
Creatinine, Ser: 0.57
Creatinine, Ser: 0.57
Creatinine, Ser: 0.67
GFR calc Af Amer: >60
GFR calc Af Amer: >60
GFR calc Af Amer: >60
GFR calc non Af Amer: >60
GFR calc non Af Amer: >60
Glucose, Bld: 125 — ABNORMAL HIGH
Glucose, Bld: 93
Sodium: 136
Sodium: 137
Total Bilirubin: 0.7
Total Protein: 4.8 — ABNORMAL LOW
Total Protein: 4.8 — ABNORMAL LOW
Total Protein: 6.9

## 2010-10-29 LAB — HEMOGLOBIN AND HEMATOCRIT, BLOOD: Hemoglobin: 11.4 — ABNORMAL LOW

## 2010-10-29 LAB — TYPE AND SCREEN: Antibody Screen: NEGATIVE

## 2010-10-29 LAB — POCT I-STAT 4, (NA,K, GLUC, HGB,HCT)
Glucose, Bld: 103 — ABNORMAL HIGH
HCT: 32 — ABNORMAL LOW
Hemoglobin: 10.9 — ABNORMAL LOW
Potassium: 3.9

## 2010-10-29 LAB — BASIC METABOLIC PANEL
CO2: 24
Calcium: 8.5
Chloride: 111
GFR calc Af Amer: >60
Glucose, Bld: 117 — ABNORMAL HIGH
Potassium: 4
Sodium: 138

## 2010-10-29 LAB — URINALYSIS, ROUTINE W REFLEX MICROSCOPIC
Nitrite: NEGATIVE
Specific Gravity, Urine: >1.03 — ABNORMAL HIGH
pH: 6

## 2010-10-29 LAB — LIPASE, BLOOD: Lipase: 15

## 2010-10-29 LAB — MAGNESIUM: Magnesium: 1.8

## 2010-10-29 LAB — POTASSIUM: Potassium: 4

## 2010-11-22 ENCOUNTER — Encounter (HOSPITAL_COMMUNITY): Payer: Self-pay | Attending: Oncology

## 2010-11-22 DIAGNOSIS — C180 Malignant neoplasm of cecum: Secondary | ICD-10-CM

## 2010-11-22 DIAGNOSIS — C189 Malignant neoplasm of colon, unspecified: Secondary | ICD-10-CM | POA: Insufficient documentation

## 2010-11-22 LAB — COMPREHENSIVE METABOLIC PANEL
ALT: 17 U/L (ref 0–35)
AST: 20 U/L (ref 0–37)
Albumin: 3.8 g/dL (ref 3.5–5.2)
Calcium: 10.8 mg/dL — ABNORMAL HIGH (ref 8.4–10.5)
Creatinine, Ser: 0.8 mg/dL (ref 0.50–1.10)
GFR calc non Af Amer: 81 mL/min — ABNORMAL LOW (ref >90)
Sodium: 139 mEq/L (ref 135–145)
Total Protein: 7.5 g/dL (ref 6.0–8.3)

## 2010-11-22 LAB — CBC
HCT: 42.6 % (ref 36.0–46.0)
Hemoglobin: 13.9 g/dL (ref 12.0–15.0)
MCH: 30 pg (ref 26.0–34.0)
MCHC: 32.6 g/dL (ref 30.0–36.0)
MCV: 92 fL (ref 78.0–100.0)
Platelets: 241 K/uL (ref 150–400)
RBC: 4.63 MIL/uL (ref 3.87–5.11)
RDW: 14.8 % (ref 11.5–15.5)
WBC: 5.7 K/uL (ref 4.0–10.5)

## 2010-11-22 NOTE — Progress Notes (Signed)
Labs drawn today for cbc,cea,cmp,ferr 

## 2010-11-23 ENCOUNTER — Encounter (HOSPITAL_BASED_OUTPATIENT_CLINIC_OR_DEPARTMENT_OTHER): Payer: Self-pay | Admitting: Oncology

## 2010-11-23 ENCOUNTER — Encounter (HOSPITAL_COMMUNITY): Payer: Self-pay | Admitting: Oncology

## 2010-11-23 VITALS — BP 127/76 | HR 75 | Temp 98.0°F | Ht 66.5 in | Wt 165.8 lb

## 2010-11-23 DIAGNOSIS — C180 Malignant neoplasm of cecum: Secondary | ICD-10-CM

## 2010-11-23 DIAGNOSIS — C189 Malignant neoplasm of colon, unspecified: Secondary | ICD-10-CM

## 2010-11-23 LAB — CEA: CEA: 0.8 ng/mL (ref 0.0–5.0)

## 2010-11-23 NOTE — Progress Notes (Signed)
This office note has been dictated.

## 2010-11-23 NOTE — Progress Notes (Signed)
CC:   Dalia Heading, M.D. Donna Bernard, M.D.  DIAGNOSES: 1. Stage IIA (T3 N0 M0) mildly well differentiated adenocarcinoma of     the cecum status post surgical resection by Dr. Franky Macho on     09/13/2006 with 28 benign lymph nodes.  No lymphovascular invasion     was seen and she was not given adjuvant chemotherapy.  We chose     just to observe her of course.  She had colonoscopy in January 2009     and she states in January 2012.  We will get that note from Dr.     Lovell Sheehan.  CEAs have remained in the normal range and labs from the     other day also excellent. 2. History of Lyme disease may 2009 after tick bite though I do not     have documentation of that disorder. 3. History of shingles on the right abdomen many years ago. 4. Anemia at the time of presentation which has resolved. 5. Anxiety over memory which is resolved.  HISTORY:  Kristin [name] has done well since I have seen her.  She is asymptomatic.  She is no longer worried about her memory that she was worried about last time.  She passed her memory test perfectly last year.  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 She has no changes in her bowels, but she is gaining a little bit of weight, 2 pounds since I saw her, and she is trying in the last 2 months to change her diet which I think is critical for her.  She has got bread as a weakness, white potatoes at times and probably the portions of meat are a little bit too much, but she is going to work on that.  She has no other complaints.  Has had mammography this year which was negative.  REVIEW OF SYSTEMS:  Otherwise is negative.  PHYSICAL EXAMINATION:  Today she looks very healthy.  No acute distress. She is 55 now.  She has a weight of 165 pounds on a 5 foot 6 and 1/2 inch frame with a BMI of 26.4, blood pressure 127/76 in right arm sitting position, pulse 76 and regular, respirations 14 to 16 and unlabored.  She is afebrile.  She  denies any pain.  She has no lymphadenopathy in the cervical, supraclavicular, infraclavicular, axillary, or inguinal areas.  She has no breast masses.  Lungs:  Clear to auscultation and percussion.  Heart:  Regular rhythm and rate without murmur, rub, or gallop.  Abdomen:  Normal bowel sounds.  No hepatosplenomegaly.  No masses.  No tenderness.  No peripheral edema is noted either.  She looks great.  We will see her back in another year.  We will do CEAs every 3 months for 1 more year and then I will do them every 6 months for 2 years.  Will still see her just once a year.  She had a minimally elevated calcium, but which she is on calcium with vitamin D and I do not think that is of concern just yet.  Will just watch that.  Otherwise her labs were fabulous.  She is going to keep a watch on her weight and we will discuss with any other issues with Dr. Gerda Diss.  She states she has never had a bone density and she will discuss that with Dr. Gerda Diss as well.   ______________________________ Ladona Horns. Mariel Sleet, MD ESN/MEDQ  D:  11/23/2010  T:  11/23/2010  Job:  [id number]

## 2010-11-23 NOTE — Patient Instructions (Signed)
[facility]  Discharge Instructions  RECOMMENDATIONS MADE BY THE CONSULTANT AND ANY TEST RESULTS WILL BE SENT TO YOUR REFERRING DOCTOR.   EXAM FINDINGS BY MD TODAY AND SIGNS AND SYMPTOMS TO REPORT TO CLINIC OR PRIMARY MD  SPECIAL INSTRUCTIONS/FOLLOW-UP: Lab work Needed every 12 weeks and Return to Clinic in one year.  Follow up with Dr, Lovell Sheehan for colonoscopy as scheduled.  See the front desk for your appointments.     I acknowledge that I have been informed and understand all the instructions given to me and received a copy. I do not have any more questions at this time, but understand that I may call the Specialty Clinic at [facility] at [phone] during business hours should I have any further questions or need assistance in obtaining follow-up care.    __________________________________________  _____________  __________ Signature of Patient or Authorized Representative            Date                   Time    __________________________________________ Nurse's Signature

## 2011-01-18 DIAGNOSIS — I639 Cerebral infarction, unspecified: Secondary | ICD-10-CM

## 2011-01-18 HISTORY — DX: Cerebral infarction, unspecified: I63.9

## 2011-02-08 ENCOUNTER — Encounter (HOSPITAL_COMMUNITY): Payer: Self-pay | Attending: Oncology

## 2011-02-08 DIAGNOSIS — C180 Malignant neoplasm of cecum: Secondary | ICD-10-CM

## 2011-02-08 DIAGNOSIS — C189 Malignant neoplasm of colon, unspecified: Secondary | ICD-10-CM | POA: Insufficient documentation

## 2011-02-08 LAB — CEA: CEA: 0.9 ng/mL (ref 0.0–5.0)

## 2011-02-08 NOTE — Progress Notes (Signed)
Labs drawn today for cea 

## 2011-05-03 ENCOUNTER — Encounter (HOSPITAL_COMMUNITY): Payer: Self-pay | Attending: Oncology

## 2011-05-03 DIAGNOSIS — C189 Malignant neoplasm of colon, unspecified: Secondary | ICD-10-CM | POA: Insufficient documentation

## 2011-05-04 LAB — CEA: CEA: <0.5 ng/mL (ref 0.0–5.0)

## 2011-05-04 NOTE — Progress Notes (Signed)
Lab draw

## 2011-07-26 ENCOUNTER — Other Ambulatory Visit (HOSPITAL_COMMUNITY): Payer: Self-pay

## 2011-07-29 ENCOUNTER — Encounter (HOSPITAL_COMMUNITY): Payer: Self-pay | Attending: Oncology

## 2011-07-29 DIAGNOSIS — C189 Malignant neoplasm of colon, unspecified: Secondary | ICD-10-CM | POA: Insufficient documentation

## 2011-07-29 NOTE — Progress Notes (Signed)
Kristin [name] presented for labwork. Labs per MD order drawn via Peripheral Line 23 gauge needle inserted in left AC  Good blood return present. Procedure without incident.  Needle removed intact. Patient tolerated procedure well.

## 2011-07-30 LAB — CEA: CEA: 0.7 ng/mL (ref 0.0–5.0)

## 2011-10-18 ENCOUNTER — Other Ambulatory Visit (HOSPITAL_COMMUNITY): Payer: Self-pay

## 2011-10-19 ENCOUNTER — Encounter (HOSPITAL_COMMUNITY): Payer: Self-pay | Attending: Oncology

## 2011-10-19 DIAGNOSIS — C180 Malignant neoplasm of cecum: Secondary | ICD-10-CM

## 2011-10-19 DIAGNOSIS — C189 Malignant neoplasm of colon, unspecified: Secondary | ICD-10-CM | POA: Insufficient documentation

## 2011-10-19 NOTE — Progress Notes (Signed)
Labs drawn today for cea 

## 2011-10-20 LAB — CEA: CEA: 0.5 ng/mL (ref 0.0–5.0)

## 2011-11-23 ENCOUNTER — Encounter (HOSPITAL_COMMUNITY): Payer: Self-pay | Attending: Oncology

## 2011-11-23 ENCOUNTER — Other Ambulatory Visit: Payer: Self-pay | Admitting: Family Medicine

## 2011-11-23 DIAGNOSIS — Z139 Encounter for screening, unspecified: Secondary | ICD-10-CM

## 2011-11-23 DIAGNOSIS — C189 Malignant neoplasm of colon, unspecified: Secondary | ICD-10-CM | POA: Insufficient documentation

## 2011-11-23 DIAGNOSIS — C180 Malignant neoplasm of cecum: Secondary | ICD-10-CM

## 2011-11-23 LAB — CBC
MCV: 90.4 fL (ref 78.0–100.0)
Platelets: 216 10*3/uL (ref 150–400)
RBC: 4.9 MIL/uL (ref 3.87–5.11)
RDW: 14.1 % (ref 11.5–15.5)
WBC: 5.8 10*3/uL (ref 4.0–10.5)

## 2011-11-23 LAB — COMPREHENSIVE METABOLIC PANEL
ALT: 16 U/L (ref 0–35)
AST: 20 U/L (ref 0–37)
Albumin: 3.8 g/dL (ref 3.5–5.2)
Chloride: 102 mEq/L (ref 96–112)
Creatinine, Ser: 0.76 mg/dL (ref 0.50–1.10)
Sodium: 136 mEq/L (ref 135–145)
Total Bilirubin: 0.5 mg/dL (ref 0.3–1.2)

## 2011-11-23 NOTE — Progress Notes (Signed)
Labs drawn today for cbc,cmp 

## 2011-11-25 ENCOUNTER — Encounter (HOSPITAL_BASED_OUTPATIENT_CLINIC_OR_DEPARTMENT_OTHER): Payer: Self-pay | Admitting: Oncology

## 2011-11-25 ENCOUNTER — Encounter (HOSPITAL_COMMUNITY): Payer: Self-pay | Admitting: Oncology

## 2011-11-25 VITALS — BP 169/98 | HR 80 | Temp 97.4°F | Resp 18 | Wt 160.2 lb

## 2011-11-25 DIAGNOSIS — C189 Malignant neoplasm of colon, unspecified: Secondary | ICD-10-CM

## 2011-11-25 DIAGNOSIS — C180 Malignant neoplasm of cecum: Secondary | ICD-10-CM

## 2011-11-25 NOTE — Patient Instructions (Signed)
[facility]  Discharge Instructions  RECOMMENDATIONS MADE BY THE CONSULTANT AND ANY TEST RESULTS WILL BE SENT TO YOUR REFERRING DOCTOR.   EXAM FINDINGS BY MD TODAY AND SIGNS AND SYMPTOMS TO REPORT TO CLINIC OR PRIMARY MD: Exam findings as discussed by Dr. Mariel Sleet.  SPECIAL INSTRUCTIONS/FOLLOW-UP: 1.  Please take the written prescription to the lab with you when you have your blood tests done for Dr. Gerda Diss.  You will need to have a Ca++ and PTH level drawn along with your pcp's labs. 2.  Return in 6 and 12 months for labs:  CBC, CMET, CEA 3.  Return in 12 months for another office visit. Call us sooner with questions/concerns related to your care.  Thank you!  I acknowledge that I have been informed and understand all the instructions given to me and received a copy. I do not have any more questions at this time, but understand that I may call the Specialty Clinic at [facility] at [phone] during business hours should I have any further questions or need assistance in obtaining follow-up care.    __________________________________________  _____________  __________ Signature of Patient or Authorized Representative            Date                   Time    __________________________________________ Nurse's Signature

## 2011-11-25 NOTE — Progress Notes (Signed)
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 Franky Macho on 09/13/2006 with the finding of 28 benign lymph nodes. No LV I was seen and she was not given adjuvant chemotherapy. She is just being observed. She has no evidence recurrence. She had colonoscopy in September 2012 by Dr. Lovell Sheehan who will repeat the study in 5 years. Problem #2 possible Lyme disease after tick bite 2009. She saw the rash herself but no one else documented that I do not know whether she has the blood studies to confirm Problem #3 mild hypercalcemia recently not on calcium and not on vitamin D from a number of months. In 2008 she actually has a low normal calcium level under 9 and now she is consistently above 10.5. She will get a PTH level and repeat calcium next week and we will also schedule her for a bone density She has no problems on oncologic review of systems. She looks good today. She is not working do to tendinitis. She is fully functional otherwise. Bowels are working well. She is up-to-date on her colonoscopies. Vital signs are stable, in fact weight is down 5 pounds compared to last year. She is due for mammography next week. Lymph nodes remain negative throughout. Lungs are clear. Heart shows a regular rhythm and rate without murmur rub or gallop. Abdomen is soft and nontender without hepatosplenomegaly. Rest exam is negative for masses. Bowel sounds are normal. She has no arm or leg edema. Skin exam is negative. She is alert and oriented.  We will do a CEA every 6 months for 2 years. If all is well she would like to be released at that time. This should be fine from my standpoint. We will see what her PTH level shows.

## 2011-12-01 ENCOUNTER — Ambulatory Visit (HOSPITAL_COMMUNITY)
Admission: RE | Admit: 2011-12-01 | Discharge: 2011-12-01 | Disposition: A | Discharge status: Home / Self Care | Payer: Self-pay | Source: Ambulatory Visit | Attending: Oncology | Admitting: Oncology

## 2011-12-01 ENCOUNTER — Ambulatory Visit (HOSPITAL_COMMUNITY)
Admission: RE | Admit: 2011-12-01 | Discharge: 2011-12-01 | Disposition: A | Discharge status: Home / Self Care | Payer: Self-pay | Source: Ambulatory Visit | Attending: Family Medicine | Admitting: Family Medicine

## 2011-12-01 DIAGNOSIS — Z139 Encounter for screening, unspecified: Secondary | ICD-10-CM

## 2011-12-01 DIAGNOSIS — C189 Malignant neoplasm of colon, unspecified: Secondary | ICD-10-CM

## 2011-12-01 DIAGNOSIS — M899 Disorder of bone, unspecified: Secondary | ICD-10-CM | POA: Insufficient documentation

## 2011-12-01 DIAGNOSIS — Z1231 Encounter for screening mammogram for malignant neoplasm of breast: Secondary | ICD-10-CM | POA: Insufficient documentation

## 2011-12-13 ENCOUNTER — Encounter: Payer: Self-pay | Admitting: Oncology

## 2011-12-13 ENCOUNTER — Telehealth (HOSPITAL_COMMUNITY): Payer: Self-pay

## 2011-12-13 NOTE — Telephone Encounter (Signed)
Patient notified per request of Dr. Mariel Sleet that PTH was elevated but was nonspecific and he recommends that she be seen by Dr. Fransico Him.  Patient agrees to referral and we will call her with the date and time of the appointment.

## 2012-01-18 DIAGNOSIS — E213 Hyperparathyroidism, unspecified: Secondary | ICD-10-CM

## 2012-01-18 HISTORY — DX: Hyperparathyroidism, unspecified: E21.3

## 2012-04-17 DIAGNOSIS — D1800 Hemangioma unspecified site: Secondary | ICD-10-CM

## 2012-04-17 HISTORY — DX: Hemangioma unspecified site: D18.00

## 2012-05-13 ENCOUNTER — Observation Stay (HOSPITAL_COMMUNITY)
Admission: EM | Admit: 2012-05-13 | Discharge: 2012-05-15 | Disposition: A | Discharge status: Home / Self Care | Payer: 59 | Attending: Internal Medicine | Admitting: Internal Medicine

## 2012-05-13 ENCOUNTER — Emergency Department (HOSPITAL_COMMUNITY): Payer: 59

## 2012-05-13 ENCOUNTER — Encounter (HOSPITAL_COMMUNITY): Payer: Self-pay | Admitting: *Deleted

## 2012-05-13 DIAGNOSIS — G459 Transient cerebral ischemic attack, unspecified: Principal | ICD-10-CM | POA: Insufficient documentation

## 2012-05-13 DIAGNOSIS — R55 Syncope and collapse: Secondary | ICD-10-CM | POA: Insufficient documentation

## 2012-05-13 DIAGNOSIS — I10 Essential (primary) hypertension: Secondary | ICD-10-CM | POA: Insufficient documentation

## 2012-05-13 DIAGNOSIS — D1802 Hemangioma of intracranial structures: Secondary | ICD-10-CM | POA: Insufficient documentation

## 2012-05-13 DIAGNOSIS — I619 Nontraumatic intracerebral hemorrhage, unspecified: Secondary | ICD-10-CM | POA: Insufficient documentation

## 2012-05-13 DIAGNOSIS — Z85038 Personal history of other malignant neoplasm of large intestine: Secondary | ICD-10-CM | POA: Insufficient documentation

## 2012-05-13 DIAGNOSIS — E785 Hyperlipidemia, unspecified: Secondary | ICD-10-CM | POA: Insufficient documentation

## 2012-05-13 HISTORY — DX: Adverse effect of unspecified anesthetic, initial encounter: T41.45XA

## 2012-05-13 HISTORY — DX: Essential (primary) hypertension: I10

## 2012-05-13 HISTORY — DX: Other complications of anesthesia, initial encounter: T88.59XA

## 2012-05-13 LAB — BASIC METABOLIC PANEL
BUN: 13 mg/dL (ref 6–23)
Calcium: 10.5 mg/dL (ref 8.4–10.5)
GFR calc Af Amer: >90 mL/min (ref >90)
GFR calc non Af Amer: >90 mL/min (ref >90)
Glucose, Bld: 90 mg/dL (ref 70–99)
Potassium: 3.8 mEq/L (ref 3.5–5.1)
Sodium: 140 mEq/L (ref 135–145)

## 2012-05-13 LAB — CBC WITH DIFFERENTIAL/PLATELET
Basophils Relative: 0 % (ref 0–1)
Eosinophils Absolute: 0.1 10*3/uL (ref 0.0–0.7)
Eosinophils Relative: 2 % (ref 0–5)
Lymphs Abs: 2 10*3/uL (ref 0.7–4.0)
MCH: 29.2 pg (ref 26.0–34.0)
MCHC: 33.3 g/dL (ref 30.0–36.0)
MCV: 87.7 fL (ref 78.0–100.0)
Monocytes Relative: 7 % (ref 3–12)
Neutrophils Relative %: 63 % (ref 43–77)
Platelets: 211 10*3/uL (ref 150–400)

## 2012-05-13 LAB — URINALYSIS, ROUTINE W REFLEX MICROSCOPIC
Glucose, UA: NEGATIVE mg/dL
Hgb urine dipstick: NEGATIVE
Ketones, ur: NEGATIVE mg/dL
Protein, ur: NEGATIVE mg/dL
Urobilinogen, UA: 0.2 mg/dL (ref 0.0–1.0)

## 2012-05-13 MED ORDER — LABETALOL HCL 5 MG/ML IV SOLN
10.0000 mg | Freq: Once | INTRAVENOUS | Status: AC
  Administered 2012-05-13: 10 mg via INTRAVENOUS
  Filled 2012-05-13: qty 4

## 2012-05-13 NOTE — ED Provider Notes (Signed)
History    This chart was scribed for Gilda Crease, MD, by Frederik Pear, ED scribe. The patient was seen in room APA06/APA06 and the patient's care was started at 2053.    CSN: [id number]  Arrival date & time 05/13/12  2019   First MD Initiated Contact with Patient 05/13/12 2053      Chief Complaint  Patient presents with  . Hypertension  . Near Syncope    (Consider location/radiation/quality/duration/timing/severity/associated sxs/prior treatment) The history is provided by the patient, the spouse and medical records. No language interpreter was used.    Kristin [name] is a 57 y.o. female with a h/o of pre-hypertension, Lyme disease, and adenocarcinoma of the colon who presents to the Emergency Department complaining of sudden onset lightheadedness with an associated near-syncopal episode that began at 11:30 while she was singing in the choir at church. She also complains of nausea that was present when she awoke this morning and gradually worsening hypertensive. She states that the highest reading at home was 162/118. In ED, her BP is 180/97. She denies SOB, CP, numbness, or tingling. She treated the symptoms at home with 4 aspirin with no relief. She states that she has been extremely busy and abnormally stressed during the last 3 weeks.  Past Medical History  Diagnosis Date  . Lyme disease 09/02/2010  . Adenocarcinoma of colon 2008    Past Surgical History  Procedure Laterality Date  . Colon resection  2008  . Colonoscopy  10/12/2010    Procedure: COLONOSCOPY;  Surgeon: Dalia Heading;  [facility];  Service: Gastroenterology;  Laterality: N/A;    Family History  Problem Relation Age of Onset  . Cancer Maternal Grandfather     History  Substance Use Topics  . Smoking status: Never Smoker   . Smokeless tobacco: Never Used  . Alcohol Use: No    OB History   Grav Para Term Preterm Abortions TAB SAB Ect Mult Living                  Review of  Systems  Respiratory: Negative for shortness of breath.   Cardiovascular:       Hypertensive  Gastrointestinal: Negative for nausea, vomiting and diarrhea.  Neurological: Positive for light-headedness. Negative for weakness and numbness.       Near-syncopal episode.  All other systems reviewed and are negative.    Allergies  Review of patient's allergies indicates no known allergies.  Home Medications   Current Outpatient Rx  Name  Route  Sig  Dispense  Refill  . aspirin EC 81 MG tablet   Oral   Take 81 mg by mouth daily. Takes 3 x weekly           BP 180/97  Pulse 72  Temp(Src) 98.9 F (37.2 C) (Oral)  Resp 18  Ht 5\' 7"  (1.702 m)  Wt 160 lb (72.576 kg)  BMI 25.05 kg/m2  SpO2 99%  Physical Exam  Nursing note and vitals reviewed. Constitutional: She is oriented to person, place, and time. She appears well-developed and well-nourished. No distress.  HENT:  Head: Normocephalic and atraumatic.  Right Ear: Hearing normal.  Nose: Nose normal.  Mouth/Throat: Oropharynx is clear and moist and mucous membranes are normal.  Eyes: Conjunctivae and EOM are normal. Pupils are equal, round, and reactive to light.  Neck: Normal range of motion. Neck supple.  Cardiovascular: Normal rate, regular rhythm, S1 normal and S2 normal.  Exam reveals no gallop and  no friction rub.   No murmur heard. Pulmonary/Chest: Effort normal and breath sounds normal. No respiratory distress. She exhibits no tenderness.  Abdominal: Soft. Normal appearance and bowel sounds are normal. There is no hepatosplenomegaly. There is no tenderness. There is no rebound, no guarding, no tenderness at McBurney's point and negative Murphy's sign. No hernia.  Musculoskeletal: Normal range of motion.  Neurological: She is alert and oriented to person, place, and time. She has normal strength. No cranial nerve deficit or sensory deficit. Coordination normal. GCS eye subscore is 4. GCS verbal subscore is 5. GCS motor  subscore is 6.  Skin: Skin is warm, dry and intact. No rash noted. No cyanosis.  Psychiatric: She has a normal mood and affect. Her speech is normal and behavior is normal. Thought content normal.    ED Course  Procedures (including critical care time)  EKG:  Date: 05/13/2012  Rate: 65  Rhythm: normal sinus rhythm and premature ventricular contractions (PVC)  QRS Axis: normal  Intervals: normal  ST/T Wave abnormalities: normal  Conduction Disutrbances:none  Narrative Interpretation:   Old EKG Reviewed: none available    DIAGNOSTIC STUDIES: Oxygen Saturation is 99% on room air, normal by my interpretation.    COORDINATION OF CARE:  20:55- Discussed planned course of treatment with the patient, including an EKG, UA, blood work, and head CT without contrast, who is agreeable at this time.  Labs Reviewed  CBC WITH DIFFERENTIAL  BASIC METABOLIC PANEL  TROPONIN I  URINALYSIS, ROUTINE W REFLEX MICROSCOPIC   Ct Head Wo Contrast  05/13/2012  *RADIOLOGY REPORT*  Clinical Data: Dizziness, weakness, and nausea for 1 day.  CT HEAD WITHOUT CONTRAST  Technique:  Contiguous axial images were obtained from the base of the skull through the vertex without contrast.  Comparison: None.  Findings: There is a 4 mm focus of increased attenuation in the anterior left periventricular deep white matter.  This may represent early dystrophic calcification versus focal acute hemorrhage.  Lacunar infarct is not excluded.  Intracranial contents are otherwise normal.  There is no mass effect or midline shift.  No abnormal extra-axial fluid collections.  No ventricular dilatation.  Gray-white matter junctions are distinct.  Basal cisterns are not effaced.  No depressed skull fractures. Visualized paranasal sinuses and mastoid air cells are not opacified.  IMPRESSION: Tiny focus of increased attenuation in the left anterior frontal deep white matter may represent focal hemorrhage or early dystrophic calcification.   Results were discussed with Dr. Blinda Leatherwood at 2204 hours on 05/14/2011.   Original Report Authenticated By: Burman Nieves, M.D.      Diagnosis: 1. Near syncope 2. Uncontrolled hypertension 3. Possible punctate intracranial bleed    MDM  Patient presents with complaints of a near syncopal episode earlier today. She has started to feel better course of the day, but has been monitoring her blood pressure has been going up. She does not have a history of hypertension. Patient is oxygenating any chest pain, shortness of breath, blurred vision. She has a slight discomfort in her head, not really pain. Neurologic workup was entirely normal here in the ER. Patient was moderately hypertensive at 180/97. This has resolved with labetalol 10 mg. She is now normotensive.  Patient had a head CT which raise concern for possible punctate bleed. This can also be calcification. I discussed the case with Dr. Rito Ehrlich, on call for hospitalist program. My discussion with him was whether not the patient could be monitored overnight until MRI was available in the  morning. He was not comfortable watching the patient here at [facility] until MRI available in the morning. It is felt that if the patient's MRI does not show acute hemorrhagic process, that she would be okay to be discharged to home and followup with her primary care doctor. Patient will therefore be sent to [facility] cone the ER to undergo MRI. I did discuss this with the patient. She is comfortable with the plan. She realizes that if there any acute findings she'll be admitted to the hospital, but if negative, can be discharged to home.  Case discussed with Dr. Baldemar Friday, in the ER at [facility]. He has accepted the patient for transfer for further testing. Patient stable at time of transfer.  I personally performed the services described in this documentation, which was scribed in my presence. The recorded information has been reviewed and is  accurate.        Gilda Crease, MD 05/13/12 [phone]

## 2012-05-13 NOTE — ED Notes (Signed)
MD at bedside. 

## 2012-05-13 NOTE — ED Notes (Signed)
Pt states she just hasn't felt normal today. Pt states she was nauseous this morning. Got to church and began to feel light headed. Pt then said she got up to go sit in a chair and she "passed out". Husband states pt didn't actually pass out because he was holding on to her and she walked to another room. Pt c/o high blood pressure today.

## 2012-05-14 ENCOUNTER — Encounter (HOSPITAL_COMMUNITY): Payer: Self-pay | Admitting: Internal Medicine

## 2012-05-14 ENCOUNTER — Emergency Department (HOSPITAL_COMMUNITY): Payer: 59

## 2012-05-14 DIAGNOSIS — I10 Essential (primary) hypertension: Secondary | ICD-10-CM

## 2012-05-14 DIAGNOSIS — G459 Transient cerebral ischemic attack, unspecified: Secondary | ICD-10-CM | POA: Diagnosis present

## 2012-05-14 DIAGNOSIS — I619 Nontraumatic intracerebral hemorrhage, unspecified: Secondary | ICD-10-CM

## 2012-05-14 HISTORY — DX: Essential (primary) hypertension: I10

## 2012-05-14 LAB — GLUCOSE, CAPILLARY: Glucose-Capillary: 86 mg/dL (ref 70–99)

## 2012-05-14 LAB — HEMOGLOBIN A1C: Hgb A1c MFr Bld: 5.6 % (ref <5.7)

## 2012-05-14 MED ORDER — ASPIRIN EC 81 MG PO TBEC
81.0000 mg | DELAYED_RELEASE_TABLET | Freq: Every day | ORAL | Status: DC
  Administered 2012-05-14 – 2012-05-15 (×2): 81 mg via ORAL
  Filled 2012-05-14 (×2): qty 1

## 2012-05-14 MED ORDER — ACETAMINOPHEN 325 MG PO TABS: 650.0000 mg | ORAL_TABLET | ORAL | Status: DC | PRN

## 2012-05-14 MED ORDER — LABETALOL HCL 100 MG PO TABS
100.0000 mg | ORAL_TABLET | Freq: Two times a day (BID) | ORAL | Status: DC
  Administered 2012-05-14 – 2012-05-15 (×3): 100 mg via ORAL
  Filled 2012-05-14 (×6): qty 1

## 2012-05-14 MED ORDER — HYDRALAZINE HCL 20 MG/ML IJ SOLN: 10.0000 mg | Freq: Four times a day (QID) | INTRAMUSCULAR | Status: DC | PRN

## 2012-05-14 NOTE — H&P (Signed)
[facility] History and Physical  [name]:[id number] DOB: 1955-12-15 DOA: 05/13/2012  Referring physician: Dr. Lavella Lemons PCP: Kristin Asa, MD   Chief Complaint: Near syncope, HA, elevated BP and small area of hemorrhage in her brain.  HPI: Kristin [name] is a 57 y.o. female with PMH significant for pre-hypertension, hx of lyme disease and adenocarcinoma of the colon; came to the hospital secondary to sensation of head pressure, left side numbness and difficulty finding words. Patient was initially seen at [facility] with these symptoms and subsequently was transferred due to abnormal findings of possible hemorrhage on left frontal lobe to [facility] for further evaluation and treatment. Patient reports symptoms start when she was at church singing in the choir, she felt lightheaded, off balance and almost passed out. Patient endorses HA, left side numbness (especially of her head) and difficulty speaking fluently. Patient denies chest pain, SOB, palpitations, fever, chills, diaphoresis, double vision, focal weakness, confusion or amnesia after the event, abnormal body movements, bladder or bowl incontinence. She was found to have elevated BP in the ED.  CT brain and MRI brain disclosed a small focus of hemorrhage left frontal white matter that appears chronic with hemosiderin deposition. MRA brain unremarkable.   Review of Systems:  Negative except as mentioned on HPI.  Past Medical History  Diagnosis Date  . Lyme disease 09/02/2010  . Adenocarcinoma of colon 2008   Past Surgical History  Procedure Laterality Date  . Colon resection  2008  . Colonoscopy  10/12/2010    Procedure: COLONOSCOPY;  Surgeon: Dalia Heading;  Location: [facility];  Service: Gastroenterology;  Laterality: N/A;   Social History:  reports that she has never smoked. She has never used smokeless tobacco. She reports that she does not drink alcohol or use illicit drugs. Lives at home, no needs with ADL's  prior to admission.  No Known Allergies  Family History  Problem Relation Age of Onset  . Cancer Maternal Grandfather      Prior to Admission medications   Medication Sig Start Date End Date Taking? Authorizing Provider  aspirin EC 81 MG tablet Take 81 mg by mouth daily. Takes 3 x weekly    Historical Provider, MD   Physical Exam: Filed Vitals:   05/14/12 5621 05/14/12 0653 05/14/12 0715 05/14/12 0745  BP: 159/82 166/90 160/89 159/92  Pulse: 70 61 65 66  Temp: 98 F (36.7 C) 98.7 F (37.1 C)    TempSrc: Oral Oral    Resp: 20 20    Height:      Weight:      SpO2: 99% 99% 98% 99%     General:  NAD, cooperative to examination, afebrile and w/o facial droop or dysarthria.  Eyes: PERRL, EOMI, no icterus, no nystagmus  ENT: MMM, no erythema or exudates, no drainage out of ears or nostrils  Neck: supple, no bruits, no thyromegaly  Cardiovascular: S1 and S2, no rubs, no gallops, no murmurs  Respiratory: CTA bilaterally  Abdomen: soft, NT, ND, positive BS  Skin: no rash or petechiae  Musculoskeletal: FROM, no joint swelling or joint erythema  Psychiatric: appropriate, no SI or hallucinations  Neurologic: CN intact, MS 5/5 bilaterally, normal pinprick, AAOX3, no focal deficit. Tongue and uvula midline.  Labs on Admission:  Basic Metabolic Panel:  Recent Labs Lab 05/13/12 2058  NA 140  K 3.8  CL 105  CO2 25  GLUCOSE 90  BUN 13  CREATININE 0.74  CALCIUM 10.5   CBC:  Recent Labs Lab 05/13/12 2058  WBC 7.0  NEUTROABS 4.4  HGB 14.5  HCT 43.6  MCV 87.7  PLT 211   Cardiac Enzymes:  Recent Labs Lab 05/13/12 2058  TROPONINI <0.30    Radiological Exams on Admission: Ct Head Wo Contrast  05/13/2012  *RADIOLOGY REPORT*  Clinical Data: Dizziness, weakness, and nausea for 1 day.  CT HEAD WITHOUT CONTRAST  Technique:  Contiguous axial images were obtained from the base of the skull through the vertex without contrast.  Comparison: None.  Findings: There  is a 4 mm focus of increased attenuation in the anterior left periventricular deep white matter.  This may represent early dystrophic calcification versus focal acute hemorrhage.  Lacunar infarct is not excluded.  Intracranial contents are otherwise normal.  There is no mass effect or midline shift.  No abnormal extra-axial fluid collections.  No ventricular dilatation.  Gray-white matter junctions are distinct.  Basal cisterns are not effaced.  No depressed skull fractures. Visualized paranasal sinuses and mastoid air cells are not opacified.  IMPRESSION: Tiny focus of increased attenuation in the left anterior frontal deep white matter may represent focal hemorrhage or early dystrophic calcification.  Results were discussed with Dr. Blinda Leatherwood at 2204 hours on 05/14/2011.   Original Report Authenticated By: Burman Nieves, M.D.    Mr Angiogram Head Wo Contrast  05/14/2012  *RADIOLOGY REPORT*  Clinical Data:  Hypertension.  Weakness and dizziness.  Abnormal CT.  Rule out hemorrhage.  MRI HEAD WITHOUT CONTRAST MRA HEAD WITHOUT CONTRAST  Technique:  Multiplanar, multiecho pulse sequences of the brain and surrounding structures were obtained without intravenous contrast. Angiographic images of the head were obtained using MRA technique without contrast.  Comparison:  CT head 05/13/2012  MRI HEAD  Findings:  Small focus of hemorrhage in the left frontal white matter appears chronic.  This is hyperdense on CT.  On MRI there is a peripheral rim of hemosiderin and central hyperintensity on T2. There is surrounding white matter edema.  No other areas of hemorrhage.  Scattered small white matter hyperintensities bilaterally. Negative for acute infarct.  No mass or edema.  No hydrocephalus or midline shift.  Pineal cyst measures 6 x 15 mm.  This appears benign without significant mass effect on the midbrain.  IMPRESSION: Small focus of hemorrhage left frontal white matter appears chronic with hemosiderin deposition.  No  acute infarct.  MRA HEAD  Findings: Both vertebral arteries are patent to the basilar.  Right vertebral artery is dominant.  PICA is patent bilaterally.  The basilar is widely patent.  Superior cerebellar and posterior cerebral arteries are patent bilaterally.  Internal carotid artery is patent bilaterally without stenosis. The anterior and middle cerebral arteries are patent bilaterally.  Negative for aneurysm or vascular malformation.  IMPRESSION: Negative   Original Report Authenticated By: Janeece Riggers, M.D.    Mr Brain Wo Contrast  05/14/2012  *RADIOLOGY REPORT*  Clinical Data:  Hypertension.  Weakness and dizziness.  Abnormal CT.  Rule out hemorrhage.  MRI HEAD WITHOUT CONTRAST MRA HEAD WITHOUT CONTRAST  Technique:  Multiplanar, multiecho pulse sequences of the brain and surrounding structures were obtained without intravenous contrast. Angiographic images of the head were obtained using MRA technique without contrast.  Comparison:  CT head 05/13/2012  MRI HEAD  Findings:  Small focus of hemorrhage in the left frontal white matter appears chronic.  This is hyperdense on CT.  On MRI there is a peripheral rim of hemosiderin and central hyperintensity on T2. There is surrounding  white matter edema.  No other areas of hemorrhage.  Scattered small white matter hyperintensities bilaterally. Negative for acute infarct.  No mass or edema.  No hydrocephalus or midline shift.  Pineal cyst measures 6 x 15 mm.  This appears benign without significant mass effect on the midbrain.  IMPRESSION: Small focus of hemorrhage left frontal white matter appears chronic with hemosiderin deposition.  No acute infarct.  MRA HEAD  Findings: Both vertebral arteries are patent to the basilar.  Right vertebral artery is dominant.  PICA is patent bilaterally.  The basilar is widely patent.  Superior cerebellar and posterior cerebral arteries are patent bilaterally.  Internal carotid artery is patent bilaterally without stenosis. The  anterior and middle cerebral arteries are patent bilaterally.  Negative for aneurysm or vascular malformation.  IMPRESSION: Negative   Original Report Authenticated By: Janeece Riggers, M.D.     Assessment/Plan 1-TIA vs Stroke, hemorrhagic: patient left side numbness, HA and complaints of difficulty expressing herself/fluent speech. MRI showing small punctate hemorrhagic stroke affecting left frontal lobe. -admit to telemetry -check 2-D echo and carotid doppler as part of TIA workup -MRA demonstrated no AVM or aneurysm; in fact blood collection appears to be old with hemosiderin ring around. -will control HTN -check lipid panel and A1C -baby [name] for 2ry prevention. -will follow neurology consultation.  2-accelerated HTN: treated with IV labetalol on admission. Now fair control. Will start patient on labetalol and use PRN hydralazine -low sodium diet.  3-TIA (transient ischemic attack): workup and tx as mentioned above. [name] for secondary prevention.  4-DVT: SCD's.    Neurology (Dr. Leroy Kennedy)  Code Status: Full Family Communication: daughter at bedside Disposition Plan: Observation, telemetry; TIA vs stroke; LOS < 2 midnights  Time spent: > 30 minutes  Seiya Silsby [facility] Pager [phone]  If 7PM-7AM, please contact night-coverage [email or web address] Password [facility] 05/14/2012, 8:40 AM

## 2012-05-14 NOTE — ED Notes (Signed)
Neurologist at the bedside with pt.

## 2012-05-14 NOTE — ED Notes (Signed)
Rad MD contacted, MRI tech to be called in, pending arrival of MRI tech. EDP and rad notified. No changes with pt, VSS.

## 2012-05-14 NOTE — ED Provider Notes (Signed)
MRI shows small, chronic appearing hemorrhage in right frontal lobe. Patient has no neurologic complaints and normal neurologic exam. She has been somewhat hypertensive. We will tx with dose of IV labetalol. Hospitalist paged to request admission.   Brandt Loosen, MD 05/14/12 [phone]

## 2012-05-14 NOTE — Consult Note (Signed)
Referring Physician: Dr. Vassie Loll    Chief Complaint: small cerebral hemorrhage  HPI:                                                                                                                                         Kristin [name] is an 57 y.o. female, right handed, with a past medical history significant for pre-hypertension, Lyme disease, and adenocarcinoma of the colon transferred from [facility] ED for further evaluation of a small area of cerebral hemorrhage left frontal lobe. She said that yesterday she was singing in the choir at church and suddenly started feeling lightheaded, off balance, and passed out. This was not associated with chest pain, SOB, palpitations, increased sweatiness, headache, double vision, focal weakness or numbness, confusion or amnesia after the event, abnormal body movements, bladder or bowl incontinence. She was found to have elevated BP in the ED. She expressed that she had a slight headache couple of days before the episode and yesterday was " slow taking and aware of having troubles expressing myself" , but denies recent head trauma, falls, or new medications. CT brain and MRI brain disclosed a small focus of hemorrhage left frontal white matter that appears chronic  with hemosiderin deposition. MRA brain unremarkable. She takes a baby aspirin every other day. Complains of " head fullness" at this moment, but admits that she has working a lot and stressed in the past couple of weeks.  Date last known well: uncertain Time last known well: uncertain.  tPA Given: No, cerebral hemorrhage.  Past Medical History  Diagnosis Date  . Lyme disease 09/02/2010  . Adenocarcinoma of colon 2008    Past Surgical History  Procedure Laterality Date  . Colon resection  2008  . Colonoscopy  10/12/2010    Procedure: COLONOSCOPY;  Surgeon: Dalia Heading;  Location: [facility];  Service: Gastroenterology;  Laterality: N/A;    Family History  Problem Relation  Age of Onset  . Cancer Maternal Grandfather    Social History:  reports that she has never smoked. She has never used smokeless tobacco. She reports that she does not drink alcohol or use illicit drugs.  Allergies: No Known Allergies  Medications:  I have reviewed the patient's current medications.  ROS:                                                                                                                                       History obtained from the patient and chart review.  General ROS: negative for - chills, fatigue, fever, night sweats, weight gain or weight loss Psychological ROS: negative for - behavioral disorder, hallucinations, memory difficulties, mood swings or suicidal ideation Ophthalmic ROS: negative for - blurry vision, double vision, eye pain or loss of vision ENT ROS: negative for - epistaxis, nasal discharge, oral lesions, sore throat, tinnitus or vertigo Allergy and Immunology ROS: negative for - hives or itchy/watery eyes Hematological and Lymphatic ROS: negative for - bleeding problems, bruising or swollen lymph nodes Endocrine ROS: negative for - galactorrhea, hair pattern changes, polydipsia/polyuria or temperature intolerance Respiratory ROS: negative for - cough, hemoptysis, shortness of breath or wheezing Cardiovascular ROS: negative for - chest pain, dyspnea on exertion, edema or irregular heartbeat Gastrointestinal ROS: negative for - abdominal pain, diarrhea, hematemesis, nausea/vomiting or stool incontinence Genito-Urinary ROS: negative for - dysuria, hematuria, incontinence or urinary frequency/urgency Musculoskeletal ROS: negative for - joint swelling or muscular weakness Neurological ROS: as noted in HPI Dermatological ROS: negative for rash and skin lesion changes    Physical exam: pleasant female in no apparent distress.  Blood pressure 159/92, pulse 66, temperature 98.7 F (37.1 C), temperature source Oral, resp. rate 20, height 5\' 7"  (1.702 m), weight 72.576 kg (160 lb), SpO2 99.00%. Head: normocephalic. Neck: supple, no bruits, no JVD. Cardiac: no murmurs. Lungs: clear. Abdomen: soft, no tender, no mass. Extremities: no edema.    Neurologic Examination:                                                                                                      Mental Status: Alert, awake, oriented x 4, thought content appropriate.  Comprehension, naming, and repetition intact. Speech fluent without evidence of aphasia.  Able to follow 3 step commands without difficulty. Cranial Nerves: II: Discs flat bilaterally; Visual fields grossly normal, pupils equal, round, reactive to light and accommodation III,IV, VI: ptosis not present, extra-ocular motions intact bilaterally V,VII: smile symmetric, facial light touch sensation normal bilaterally VIII: hearing normal bilaterally IX,X: gag reflex present XI: bilateral shoulder shrug XII: midline tongue extension Motor: Right : Upper extremity   5/5    Left:     Upper extremity   5/5  Lower extremity  5/5     Lower extremity   5/5 Tone and bulk:normal tone throughout; no atrophy noted Sensory: Pinprick and light touch intact throughout, bilaterally Deep Tendon Reflexes: 2+ and symmetric throughout Plantars: Right: downgoing   Left: downgoing Cerebellar: normal finger-to-nose,  normal heel-to-shin test Gait: No ataxia. CV: pulses palpable throughout    Results for orders placed during the hospital encounter of 05/13/12 (from the past 48 hour(s))  CBC WITH DIFFERENTIAL     Status: None   Collection Time    05/13/12  8:58 PM      Result Value Range   WBC 7.0  4.0 - 10.5 K/uL   RBC 4.97  3.87 - 5.11 MIL/uL   Hemoglobin 14.5  12.0 - 15.0 g/dL   HCT 16.1  09.6 - 04.5 %   MCV 87.7  78.0 - 100.0 fL   MCH 29.2  26.0 - 34.0 pg   MCHC 33.3  30.0 - 36.0 g/dL    RDW 40.9  81.1 - 91.4 %   Platelets 211  150 - 400 K/uL   Neutrophils Relative 63  43 - 77 %   Neutro Abs 4.4  1.7 - 7.7 K/uL   Lymphocytes Relative 28  12 - 46 %   Lymphs Abs 2.0  0.7 - 4.0 K/uL   Monocytes Relative 7  3 - 12 %   Monocytes Absolute 0.5  0.1 - 1.0 K/uL   Eosinophils Relative 2  0 - 5 %   Eosinophils Absolute 0.1  0.0 - 0.7 K/uL   Basophils Relative 0  0 - 1 %   Basophils Absolute 0.0  0.0 - 0.1 K/uL  BASIC METABOLIC PANEL     Status: None   Collection Time    05/13/12  8:58 PM      Result Value Range   Sodium 140  135 - 145 mEq/L   Potassium 3.8  3.5 - 5.1 mEq/L   Chloride 105  96 - 112 mEq/L   CO2 25  19 - 32 mEq/L   Glucose, Bld 90  70 - 99 mg/dL   BUN 13  6 - 23 mg/dL   Creatinine, Ser 7.82  0.50 - 1.10 mg/dL   Calcium 95.6  8.4 - 21.3 mg/dL   GFR calc non Af Amer >90  >90 mL/min   GFR calc Af Amer >90  >90 mL/min   Comment:            The eGFR has been calculated     using the CKD EPI equation.     This calculation has not been     validated in all clinical     situations.     eGFR's persistently     <90 mL/min signify     possible Chronic Kidney Disease.  TROPONIN I     Status: None   Collection Time    05/13/12  8:58 PM      Result Value Range   Troponin I <0.30  <0.30 ng/mL   Comment:            Due to the release kinetics of cTnI,     a negative result within the first hours     of the onset of symptoms does not rule out     myocardial infarction with certainty.     If myocardial infarction is still suspected,     repeat the test at appropriate intervals.  URINALYSIS, ROUTINE W REFLEX MICROSCOPIC     Status: None  Collection Time    05/13/12  9:45 PM      Result Value Range   Color, Urine YELLOW  YELLOW   APPearance CLEAR  CLEAR   Specific Gravity, Urine 1.015  1.005 - 1.030   pH 5.5  5.0 - 8.0   Glucose, UA NEGATIVE  NEGATIVE mg/dL   Hgb urine dipstick NEGATIVE  NEGATIVE   Bilirubin Urine NEGATIVE  NEGATIVE   Ketones, ur NEGATIVE   NEGATIVE mg/dL   Protein, ur NEGATIVE  NEGATIVE mg/dL   Urobilinogen, UA 0.2  0.0 - 1.0 mg/dL   Nitrite NEGATIVE  NEGATIVE   Leukocytes, UA NEGATIVE  NEGATIVE   Comment: MICROSCOPIC NOT DONE ON URINES WITH NEGATIVE PROTEIN, BLOOD, LEUKOCYTES, NITRITE, OR GLUCOSE <1000 mg/dL.   Ct Head Wo Contrast  05/13/2012  *RADIOLOGY REPORT*  Clinical Data: Dizziness, weakness, and nausea for 1 day.  CT HEAD WITHOUT CONTRAST  Technique:  Contiguous axial images were obtained from the base of the skull through the vertex without contrast.  Comparison: None.  Findings: There is a 4 mm focus of increased attenuation in the anterior left periventricular deep white matter.  This may represent early dystrophic calcification versus focal acute hemorrhage.  Lacunar infarct is not excluded.  Intracranial contents are otherwise normal.  There is no mass effect or midline shift.  No abnormal extra-axial fluid collections.  No ventricular dilatation.  Gray-white matter junctions are distinct.  Basal cisterns are not effaced.  No depressed skull fractures. Visualized paranasal sinuses and mastoid air cells are not opacified.  IMPRESSION: Tiny focus of increased attenuation in the left anterior frontal deep white matter may represent focal hemorrhage or early dystrophic calcification.  Results were discussed with Dr. Blinda Leatherwood at 2204 hours on 05/14/2011.   Original Report Authenticated By: Burman Nieves, M.D.    Mr Angiogram Head Wo Contrast  05/14/2012  *RADIOLOGY REPORT*  Clinical Data:  Hypertension.  Weakness and dizziness.  Abnormal CT.  Rule out hemorrhage.  MRI HEAD WITHOUT CONTRAST MRA HEAD WITHOUT CONTRAST  Technique:  Multiplanar, multiecho pulse sequences of the brain and surrounding structures were obtained without intravenous contrast. Angiographic images of the head were obtained using MRA technique without contrast.  Comparison:  CT head 05/13/2012  MRI HEAD  Findings:  Small focus of hemorrhage in the left frontal  white matter appears chronic.  This is hyperdense on CT.  On MRI there is a peripheral rim of hemosiderin and central hyperintensity on T2. There is surrounding white matter edema.  No other areas of hemorrhage.  Scattered small white matter hyperintensities bilaterally. Negative for acute infarct.  No mass or edema.  No hydrocephalus or midline shift.  Pineal cyst measures 6 x 15 mm.  This appears benign without significant mass effect on the midbrain.  IMPRESSION: Small focus of hemorrhage left frontal white matter appears chronic with hemosiderin deposition.  No acute infarct.  MRA HEAD  Findings: Both vertebral arteries are patent to the basilar.  Right vertebral artery is dominant.  PICA is patent bilaterally.  The basilar is widely patent.  Superior cerebellar and posterior cerebral arteries are patent bilaterally.  Internal carotid artery is patent bilaterally without stenosis. The anterior and middle cerebral arteries are patent bilaterally.  Negative for aneurysm or vascular malformation.  IMPRESSION: Negative   Original Report Authenticated By: Janeece Riggers, M.D.    Mr Brain Wo Contrast  05/14/2012  *RADIOLOGY REPORT*  Clinical Data:  Hypertension.  Weakness and dizziness.  Abnormal CT.  Rule out hemorrhage.  MRI HEAD WITHOUT CONTRAST MRA HEAD WITHOUT CONTRAST  Technique:  Multiplanar, multiecho pulse sequences of the brain and surrounding structures were obtained without intravenous contrast. Angiographic images of the head were obtained using MRA technique without contrast.  Comparison:  CT head 05/13/2012  MRI HEAD  Findings:  Small focus of hemorrhage in the left frontal white matter appears chronic.  This is hyperdense on CT.  On MRI there is a peripheral rim of hemosiderin and central hyperintensity on T2. There is surrounding white matter edema.  No other areas of hemorrhage.  Scattered small white matter hyperintensities bilaterally. Negative for acute infarct.  No mass or edema.  No  hydrocephalus or midline shift.  Pineal cyst measures 6 x 15 mm.  This appears benign without significant mass effect on the midbrain.  IMPRESSION: Small focus of hemorrhage left frontal white matter appears chronic with hemosiderin deposition.  No acute infarct.  MRA HEAD  Findings: Both vertebral arteries are patent to the basilar.  Right vertebral artery is dominant.  PICA is patent bilaterally.  The basilar is widely patent.  Superior cerebellar and posterior cerebral arteries are patent bilaterally.  Internal carotid artery is patent bilaterally without stenosis. The anterior and middle cerebral arteries are patent bilaterally.  Negative for aneurysm or vascular malformation.  IMPRESSION: Negative   Original Report Authenticated By: Janeece Riggers, M.D.       Assessment: 57 y.o. female with a near syncopal episode yesterday, normal neuro-exam, and neuro-imaging studies showing a small focus of hemorrhage in the left frontal white matter that seems to be chronic. MRA brain unremarkable. No recent falls. Takes a baby aspirin every other day but no anticoagulants. No history of blood dyscrasias. She is virtually asymptomatic from a neurological standpoint at this moment. I am not quite convinced that neuro-imaging findings explain patent's recent near syncopal episode. She most likely will need a follow up CT brain in 24-48 hours to ensure stability of that small area of hemorrhage, but will ask the stroke service to make further recommendation in this regard.  Wyatt Portela, MD [facility] [phone]  05/14/2012, 8:44 AM

## 2012-05-14 NOTE — ED Notes (Signed)
Pt and family updated, EDPA at [facility].

## 2012-05-14 NOTE — ED Notes (Signed)
Back from MRI, alert, NAD, calm, interactive, resps e/u, speaking in clear complete sentences.

## 2012-05-14 NOTE — ED Notes (Signed)
Pt in MRI, family remains in room.

## 2012-05-14 NOTE — Progress Notes (Signed)
*  PRELIMINARY RESULTS* Echocardiogram 2D Echocardiogram has been performed.  Kristin [name] 05/14/2012, 4:32 PM 

## 2012-05-14 NOTE — ED Notes (Signed)
Arrives to ED by [facility], pt alert, NAD, calm, interactive, resps e/u, speaking in clear complete sentences, polite, cooperative, follows commands, sitting upright HOB 60 degrees.

## 2012-05-14 NOTE — Progress Notes (Signed)
UR Completed Sipriano Fendley Graves-Bigelow, RN,BSN [phone]  

## 2012-05-14 NOTE — ED Notes (Addendum)
Pt denies: HA, nv, dizziness, blurred vision, or other sx. states, "do not feel normal though, head feels funny". Family x3 at [facility]. EDP Dr. Juleen China into room.

## 2012-05-14 NOTE — ED Notes (Signed)
EDP into room 

## 2012-05-14 NOTE — ED Provider Notes (Signed)
1:44 AM Discussed case with Dr Blinda Leatherwood earlier. Pt assessed on arrival to ED here. Pt with vague sensation of head fullness, but hesitant to call it pain. No other complaints. CT head earlier with questionable calcification versus punctate bleed. No obvious focal deficits. MR ordered.   Raeford Razor, MD 05/14/12 [phone]

## 2012-05-15 ENCOUNTER — Observation Stay (HOSPITAL_COMMUNITY): Payer: 59

## 2012-05-15 LAB — LIPID PANEL
HDL: 62 mg/dL (ref >39)
LDL Cholesterol: 134 mg/dL — ABNORMAL HIGH (ref 0–99)
Total CHOL/HDL Ratio: 3.5 RATIO
Triglycerides: 98 mg/dL (ref <150)

## 2012-05-15 LAB — GLUCOSE, CAPILLARY

## 2012-05-15 MED ORDER — PRAVASTATIN SODIUM 40 MG PO TABS: 40.0000 mg | ORAL_TABLET | Freq: Every day | ORAL | Status: DC

## 2012-05-15 MED ORDER — ATORVASTATIN CALCIUM 10 MG PO TABS
10.0000 mg | ORAL_TABLET | Freq: Every day | ORAL | Status: DC
  Filled 2012-05-15: qty 1

## 2012-05-15 MED ORDER — LABETALOL HCL 100 MG PO TABS: 100.0000 mg | ORAL_TABLET | Freq: Two times a day (BID) | ORAL | Status: DC

## 2012-05-15 NOTE — Discharge Summary (Signed)
Kristin [name]:[id number] DOB: 07-17-1955 DOA: 05/13/2012  PCP: Harlow Asa, MD  Admit date: 05/13/2012  Discharge date: 05/15/2012  Time spent: : > 30 minutes  Recommendations for Outpatient Follow-up:  1. BMET to follow electrolytes and kidney function 2. Reassess BP and adjust medications as needed 3. Follow up of LFT's and lipid panel in 3 months. Discharge Diagnoses:  TIA (transient ischemic attack)  HTN (hypertension)  Small ICH (cavernoma)  HLD  Grade 1 diastolic dysfunction  Discharge Condition: stable and improved. Will be discharge home. Follow up with PCP in 2 weeks.  Diet recommendation: heart healthy diet  Filed Weights    05/13/12 2041  05/14/12 1036   Weight:  72.576 kg (160 lb)  71.895 kg (158 lb 8 oz)    History of present illness:  57 y.o. female with PMH significant for pre-hypertension, hx of lyme disease and adenocarcinoma of the colon; came to the hospital secondary to sensation of head pressure, left side numbness and difficulty finding words. Patient was initially seen at [facility] with these symptoms and subsequently was transferred due to abnormal findings of possible hemorrhage on left frontal lobe to [facility] for further evaluation and treatment. Patient reports symptoms start when she was at church singing in the choir, she felt lightheaded, off balance and almost passed out. Patient endorses HA, left side numbness (especially of her head) and difficulty speaking fluently.  Patient denies chest pain, SOB, palpitations, fever, chills, diaphoresis, double vision, focal weakness, confusion or amnesia after the event, abnormal body movements, bladder or bowl incontinence. She was found to have elevated BP in the ED.  Hospital Course:  1-TIA: most likely due to uncontrolled hypertension.  -ASA for secondary prevention  -Started on statins for risk factor modification  -Treatment for HTN  2-Small area of intracranial hemorrhage: per radiology readings appears to be  a cavernoma; remains stable and with hemosiderin deposits, suggesting some chronicity. Will continue controlling BP.  -no abnormalities on MRA (no AVM or aneurysm)  3-HLD: low fat diet and started on statins  4-Grade 1 diastolic dysfunction: asymptomatic at this point. Will recommend low sodium diet and good control of BP.  Rest of medical problems remains stable; plan is to continue current medication regimen.  Procedures:  Carotid dopplers (no significant evidence of ICA stenosis; vertebral flow was antegrade). 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 that demonstrates high resistance flow with a peak systolic velocity of 67cm/s  2-De cho (no wall motion abnormalities, normal ejection fraction at 55-60%; Mild LVH and grade 1 diastolic dysfunction)  Consultations:  neurology Discharge Exam:  Filed Vitals:    05/15/12 0000  05/15/12 0400  05/15/12 0739  05/15/12 0950   BP:  132/80  132/85  156/88  140/87   Pulse:  71  73  60  64   Temp:  98.2 F (36.8 C)  97.6 F (36.4 C)  98 F (36.7 C)    TempSrc:  Oral  Oral  Oral    Resp:  18  16  18     Height:       Weight:       SpO2:  97%  97%  96%     General: NAD, no HA, no blurred, vision, no nausea or vomiting  Cardiovascular: S1 and S2, no rubs, no gallops, no murmrus  Respiratory: CTA bilaterally  Abdomen: soft, NT, ND, positive BS  Neuro: non focal  Discharge Instructions  Discharge Orders  Future Appointments  Provider  Department  Dept Phone    05/25/2012 10:00 AM  [facility]  [facility]  [phone]    11/30/2012 10:00 AM  [facility]  [facility]  [phone]    12/04/2012 10:30 AM  Randall An, MD  [facility]  [phone]    Future Orders  Complete By  Expires     Diet - low sodium heart healthy  As directed      Discharge instructions  As directed      Comments:     Continue following a heart healthy diet  Take  medications as prescribed  Follow with PCP in 10 days  Keep yourself well hydrated         Medication List     TAKE these medications       aspirin EC 81 MG tablet    Take 81 mg by mouth daily. Takes 3 x weekly    labetalol 100 MG tablet    Commonly known as: NORMODYNE    Take 1 tablet (100 mg total) by mouth 2 (two) times daily.    pravastatin 40 MG tablet    Commonly known as: PRAVACHOL    Take 1 tablet (40 mg total) by mouth daily.          Follow-up Information    Follow up with Harlow Asa, MD In 10 days.    Contact information:    [address]  [address]  [place] [address]  [phone]       The results of significant diagnostics from this hospitalization (including imaging, microbiology, ancillary and laboratory) are listed below for reference.   Significant Diagnostic Studies:  Ct Head Wo Contrast  05/15/2012 *RADIOLOGY REPORT* Clinical Data: Follow-up left frontal white matter abnormality. CT HEAD WITHOUT CONTRAST Technique: Contiguous axial images were obtained from the base of the skull through the vertex without contrast. Comparison: MRI 05/14/2012. CT 05/13/2012. Findings: No change in the subcentimeter focus of slight hyperattenuation in the left frontal subcortical white matter, most consistent with a cavernoma. There is no evidence for acute stroke, new hemorrhage, or extra-axial fluid. There is no atrophy or cortical hypodensity. Calvarium is intact. Clear sinuses and mastoids. No CT signs of proximal vascular thrombosis. IMPRESSION: Stable left frontal subcortical white matter hyperattenuating focus, subcentimeter size, likely cavernoma given the MR features. Original Report Authenticated By: Davonna Belling, M.D.  Ct Head Wo Contrast  05/13/2012 *RADIOLOGY REPORT* Clinical Data: Dizziness, weakness, and nausea for 1 day. CT HEAD WITHOUT CONTRAST Technique: Contiguous axial images were obtained from the base of the skull through the vertex without contrast.  Comparison: None. Findings: There is a 4 mm focus of increased attenuation in the anterior left periventricular deep white matter. This may represent early dystrophic calcification versus focal acute hemorrhage. Lacunar infarct is not excluded. Intracranial contents are otherwise normal. There is no mass effect or midline shift. No abnormal extra-axial fluid collections. No ventricular dilatation. Gray-white matter junctions are distinct. Basal cisterns are not effaced. No depressed skull fractures. Visualized paranasal sinuses and mastoid air cells are not opacified. IMPRESSION: Tiny focus of increased attenuation in the left anterior frontal deep white matter may represent focal hemorrhage or early dystrophic calcification. Results were discussed with Dr. Blinda Leatherwood at 2204 hours on 05/14/2011. Original Report Authenticated By: Burman Nieves, M.D.  Mr Angiogram Head Wo Contrast  05/14/2012 *RADIOLOGY REPORT* Clinical Data: Hypertension. Weakness and dizziness. Abnormal CT. Rule out hemorrhage. MRI HEAD  WITHOUT CONTRAST MRA HEAD WITHOUT CONTRAST Technique: Multiplanar, multiecho pulse sequences of the brain and surrounding structures were obtained without intravenous contrast. Angiographic images of the head were obtained using MRA technique without contrast. Comparison: CT head 05/13/2012 MRI HEAD Findings: Small focus of hemorrhage in the left frontal white matter appears chronic. This is hyperdense on CT. On MRI there is a peripheral rim of hemosiderin and central hyperintensity on T2. There is surrounding white matter edema. No other areas of hemorrhage. Scattered small white matter hyperintensities bilaterally. Negative for acute infarct. No mass or edema. No hydrocephalus or midline shift. Pineal cyst measures 6 x 15 mm. This appears benign without significant mass effect on the midbrain. IMPRESSION: Small focus of hemorrhage left frontal white matter appears chronic with hemosiderin deposition. No acute  infarct. MRA HEAD Findings: Both vertebral arteries are patent to the basilar. Right vertebral artery is dominant. PICA is patent bilaterally. The basilar is widely patent. Superior cerebellar and posterior cerebral arteries are patent bilaterally. Internal carotid artery is patent bilaterally without stenosis. The anterior and middle cerebral arteries are patent bilaterally. Negative for aneurysm or vascular malformation. IMPRESSION: Negative Original Report Authenticated By: Janeece Riggers, M.D.  Mr Brain Wo Contrast  05/14/2012 *RADIOLOGY REPORT* Clinical Data: Hypertension. Weakness and dizziness. Abnormal CT. Rule out hemorrhage. MRI HEAD WITHOUT CONTRAST MRA HEAD WITHOUT CONTRAST Technique: Multiplanar, multiecho pulse sequences of the brain and surrounding structures were obtained without intravenous contrast. Angiographic images of the head were obtained using MRA technique without contrast. Comparison: CT head 05/13/2012 MRI HEAD Findings: Small focus of hemorrhage in the left frontal white matter appears chronic. This is hyperdense on CT. On MRI there is a peripheral rim of hemosiderin and central hyperintensity on T2. There is surrounding white matter edema. No other areas of hemorrhage. Scattered small white matter hyperintensities bilaterally. Negative for acute infarct. No mass or edema. No hydrocephalus or midline shift. Pineal cyst measures 6 x 15 mm. This appears benign without significant mass effect on the midbrain. IMPRESSION: Small focus of hemorrhage left frontal white matter appears chronic with hemosiderin deposition. No acute infarct. MRA HEAD Findings: Both vertebral arteries are patent to the basilar. Right vertebral artery is dominant. PICA is patent bilaterally. The basilar is widely patent. Superior cerebellar and posterior cerebral arteries are patent bilaterally. Internal carotid artery is patent bilaterally without stenosis. The anterior and middle cerebral arteries are patent  bilaterally. Negative for aneurysm or vascular malformation. IMPRESSION: Negative Original Report Authenticated By: Janeece Riggers, M.D.  Labs:  Basic Metabolic Panel:   Recent Labs  Lab  05/13/12 2058   NA  140   K  3.8   CL  105   CO2  25   GLUCOSE  90   BUN  13   CREATININE  0.74   CALCIUM  10.5    CBC:   Recent Labs  Lab  05/13/12 2058   WBC  7.0   NEUTROABS  4.4   HGB  14.5   HCT  43.6   MCV  87.7   PLT  211    Cardiac Enzymes:   Recent Labs  Lab  05/13/12 2058   TROPONINI  <0.30    CBG:   Recent Labs  Lab  05/14/12 1630  05/14/12 2216  05/15/12 0735  05/15/12 1157   GLUCAP  88  86  88  100*    Signed:  Renan Danese  [facility]  05/15/2012, 2:04 PM

## 2012-05-15 NOTE — Progress Notes (Signed)
Physician Discharge Summary  Kristin [name] ZOX:[id number] DOB: [birth year] DOA: 05/13/2012  PCP: Harlow Asa, MD  Admit date: 05/13/2012 Discharge date: 05/15/2012  Time spent: : > 30 minutes  Recommendations for Outpatient Follow-up:  1. BMET to follow electrolytes and kidney function 2. Reassess BP and adjust medications as needed 3. Follow up of LFT's and lipid panel in 3 months. 4.   Discharge Diagnoses:  TIA (transient ischemic attack) HTN (hypertension) Small ICH (cavernoma) HLD Grade 1 diastolic dysfunction  Discharge Condition: stable and improved. Will be discharge home. Follow up with PCP in 2 weeks.  Diet recommendation: heart healthy diet  Filed Weights   05/13/12 2041 05/14/12 1036  Weight: 72.576 kg (160 lb) 71.895 kg (158 lb 8 oz)    History of present illness:  57 y.o. female with PMH significant for pre-hypertension, hx of lyme disease and adenocarcinoma of the colon; came to the hospital secondary to sensation of head pressure, left side numbness and difficulty finding words. Patient was initially seen at [facility] with these symptoms and subsequently was transferred due to abnormal findings of possible hemorrhage on left frontal lobe to [facility] for further evaluation and treatment. Patient reports symptoms start when she was at church singing in the choir, she felt lightheaded, off balance and almost passed out. Patient endorses HA, left side numbness (especially of her head) and difficulty speaking fluently.  Patient denies chest pain, SOB, palpitations, fever, chills, diaphoresis, double vision, focal weakness, confusion or amnesia after the event, abnormal body movements, bladder or bowl incontinence. She was found to have elevated BP in the ED.   Hospital Course:  1-TIA: most likely due to uncontrolled hypertension. -ASA for secondary prevention -Started on statins for risk factor modification -Treatment for HTN  2-Small area of intracranial hemorrhage:  per radiology readings appears to be a cavernoma; remains stable and with hemosiderin deposits, suggesting some chronicity. Will continue controlling BP. -no abnormalities on MRA (no AVM or aneurysm)  3-HLD: low fat diet and started on statins   4-Grade 1 diastolic dysfunction: asymptomatic at this point. Will recommend low sodium diet and good control of BP.  Rest of medical problems remains stable; plan is to continue current medication regimen.  Procedures: Carotid dopplers (no significant evidence of ICA stenosis; vertebral flow was antegrade). 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 that demonstrates high resistance flow with a peak systolic velocity of 67cm/s  2-De cho (no wall motion abnormalities, normal ejection fraction at 55-60%; Mild LVH and grade 1 diastolic dysfunction)  Consultations:  neurology  Discharge Exam: Filed Vitals:   05/15/12 0000 05/15/12 0400 05/15/12 0739 05/15/12 0950  BP: 132/80 132/85 156/88 140/87  Pulse: 71 73 60 64  Temp: 98.2 F (36.8 C) 97.6 F (36.4 C) 98 F (36.7 C)   TempSrc: Oral Oral Oral   Resp: 18 16 18    Height:      Weight:      SpO2: 97% 97% 96%     General: NAD, no HA, no blurred, vision, no nausea or vomiting Cardiovascular: S1 and S2, no rubs, no gallops, no murmrus Respiratory: CTA bilaterally Abdomen: soft, NT, ND, positive BS Neuro: non focal  Discharge Instructions  Discharge Orders   Future Appointments Provider Department Dept Phone   05/25/2012 10:00 AM [facility] [phone]   11/30/2012 10:00 AM [facility] [phone]   12/04/2012 10:30 AM Randall An, MD [facility] [phone]   Future Orders Complete By Expires     Diet - low sodium heart healthy  As directed     Discharge instructions  As directed     Comments:      Continue following a heart healthy diet Take medications as  prescribed Follow with PCP in 10 days Keep yourself well hydrated        Medication List    TAKE these medications       aspirin EC 81 MG tablet  Take 81 mg by mouth daily. Takes 3 x weekly     labetalol 100 MG tablet  Commonly known as:  NORMODYNE  Take 1 tablet (100 mg total) by mouth 2 (two) times daily.     pravastatin 40 MG tablet  Commonly known as:  PRAVACHOL  Take 1 tablet (40 mg total) by mouth daily.           Follow-up Information   Follow up with Harlow Asa, MD In 10 days.   Contact information:   [address] [place] [address] [phone]        The results of significant diagnostics from this hospitalization (including imaging, microbiology, ancillary and laboratory) are listed below for reference.    Significant Diagnostic Studies: Ct Head Wo Contrast  05/15/2012  *RADIOLOGY REPORT*  Clinical Data: Follow-up left frontal white matter abnormality.  CT HEAD WITHOUT CONTRAST  Technique:  Contiguous axial images were obtained from the base of the skull through the vertex without contrast.  Comparison: MRI 05/14/2012.  CT 05/13/2012.  Findings: No change in the subcentimeter focus of slight hyperattenuation in the left frontal subcortical white matter, most consistent with a cavernoma.  There is no evidence for acute stroke, new hemorrhage, or extra-axial fluid. There is no atrophy or cortical hypodensity.  Calvarium is intact.  Clear sinuses and mastoids.  No CT signs of proximal vascular thrombosis.  IMPRESSION: Stable left frontal subcortical white matter hyperattenuating focus, subcentimeter size, likely cavernoma given the MR features.   Original Report Authenticated By: Davonna Belling, M.D.    Ct Head Wo Contrast  05/13/2012  *RADIOLOGY REPORT*  Clinical Data: Dizziness, weakness, and nausea for 1 day.  CT HEAD WITHOUT CONTRAST  Technique:  Contiguous axial images were obtained from the base of the skull through the vertex without contrast.   Comparison: None.  Findings: There is a 4 mm focus of increased attenuation in the anterior left periventricular deep white matter.  This may represent early dystrophic calcification versus focal acute hemorrhage.  Lacunar infarct is not excluded.  Intracranial contents are otherwise normal.  There is no mass effect or midline shift.  No abnormal extra-axial fluid collections.  No ventricular dilatation.  Gray-white matter junctions are distinct.  Basal cisterns are not effaced.  No depressed skull fractures. Visualized paranasal sinuses and mastoid air cells are not opacified.  IMPRESSION: Tiny focus of increased attenuation in the left anterior frontal deep white matter may represent focal hemorrhage or early dystrophic calcification.  Results were discussed with Dr. Blinda Leatherwood at 2204 hours on 05/14/2011.   Original Report Authenticated By: Burman Nieves, M.D.    Mr Angiogram Head Wo Contrast  05/14/2012  *RADIOLOGY REPORT*  Clinical Data:  Hypertension.  Weakness and dizziness.  Abnormal CT.  Rule out hemorrhage.  MRI HEAD WITHOUT CONTRAST MRA HEAD WITHOUT CONTRAST  Technique:  Multiplanar, multiecho pulse sequences of the brain and surrounding structures were obtained without intravenous contrast. Angiographic images of the  head were obtained using MRA technique without contrast.  Comparison:  CT head 05/13/2012  MRI HEAD  Findings:  Small focus of hemorrhage in the left frontal white matter appears chronic.  This is hyperdense on CT.  On MRI there is a peripheral rim of hemosiderin and central hyperintensity on T2. There is surrounding white matter edema.  No other areas of hemorrhage.  Scattered small white matter hyperintensities bilaterally. Negative for acute infarct.  No mass or edema.  No hydrocephalus or midline shift.  Pineal cyst measures 6 x 15 mm.  This appears benign without significant mass effect on the midbrain.  IMPRESSION: Small focus of hemorrhage left frontal white matter appears chronic  with hemosiderin deposition.  No acute infarct.  MRA HEAD  Findings: Both vertebral arteries are patent to the basilar.  Right vertebral artery is dominant.  PICA is patent bilaterally.  The basilar is widely patent.  Superior cerebellar and posterior cerebral arteries are patent bilaterally.  Internal carotid artery is patent bilaterally without stenosis. The anterior and middle cerebral arteries are patent bilaterally.  Negative for aneurysm or vascular malformation.  IMPRESSION: Negative   Original Report Authenticated By: Janeece Riggers, M.D.    Mr Brain Wo Contrast  05/14/2012  *RADIOLOGY REPORT*  Clinical Data:  Hypertension.  Weakness and dizziness.  Abnormal CT.  Rule out hemorrhage.  MRI HEAD WITHOUT CONTRAST MRA HEAD WITHOUT CONTRAST  Technique:  Multiplanar, multiecho pulse sequences of the brain and surrounding structures were obtained without intravenous contrast. Angiographic images of the head were obtained using MRA technique without contrast.  Comparison:  CT head 05/13/2012  MRI HEAD  Findings:  Small focus of hemorrhage in the left frontal white matter appears chronic.  This is hyperdense on CT.  On MRI there is a peripheral rim of hemosiderin and central hyperintensity on T2. There is surrounding white matter edema.  No other areas of hemorrhage.  Scattered small white matter hyperintensities bilaterally. Negative for acute infarct.  No mass or edema.  No hydrocephalus or midline shift.  Pineal cyst measures 6 x 15 mm.  This appears benign without significant mass effect on the midbrain.  IMPRESSION: Small focus of hemorrhage left frontal white matter appears chronic with hemosiderin deposition.  No acute infarct.  MRA HEAD  Findings: Both vertebral arteries are patent to the basilar.  Right vertebral artery is dominant.  PICA is patent bilaterally.  The basilar is widely patent.  Superior cerebellar and posterior cerebral arteries are patent bilaterally.  Internal carotid artery is patent  bilaterally without stenosis. The anterior and middle cerebral arteries are patent bilaterally.  Negative for aneurysm or vascular malformation.  IMPRESSION: Negative   Original Report Authenticated By: Janeece Riggers, M.D.     Labs: Basic Metabolic Panel:  Recent Labs Lab 05/13/12 2058  NA 140  K 3.8  CL 105  CO2 25  GLUCOSE 90  BUN 13  CREATININE 0.74  CALCIUM 10.5   CBC:  Recent Labs Lab 05/13/12 2058  WBC 7.0  NEUTROABS 4.4  HGB 14.5  HCT 43.6  MCV 87.7  PLT 211   Cardiac Enzymes:  Recent Labs Lab 05/13/12 2058  TROPONINI <0.30   CBG:  Recent Labs Lab 05/14/12 1630 05/14/12 2216 05/15/12 0735 05/15/12 1157  GLUCAP 88 86 88 100*    Signed:  Lavena Loretto  [facility] 05/15/2012, 2:04 PM

## 2012-05-15 NOTE — Progress Notes (Addendum)
*  PRELIMINARY RESULTS* Vascular Ultrasound Carotid Duplex (Doppler) has been completed. There is no obvious evidence of hemodynamically significant internal carotid artery stenosis >40%. There appears to be an extracranial internal carotid artery branch with high resistance flow. Vertebral arteries are patent with antegrade flow.  05/15/2012 9:48 AM Gertie Fey, RDMS, RDCS

## 2012-05-15 NOTE — Progress Notes (Signed)
Stroke Team Progress Note  HISTORY Kristin [name] is an 57 y.o. female, right handed, with a past medical history significant for pre-hypertension, Lyme disease, and adenocarcinoma of the colon transferred from [facility] ED for further evaluation of a small area of cerebral hemorrhage left frontal lobe.   She said that yesterday 05/13/2012 she was singing in the choir at church and suddenly started feeling lightheaded, off balance, and passed out. This was not associated with chest pain, SOB, palpitations, increased sweatiness, headache, double vision, focal weakness or numbness, confusion or amnesia after the event, abnormal body movements, bladder or bowl incontinence. She was found to have elevated BP in the ED. She expressed that she had a slight headache couple of days before the episode and yesterday was " slow taking and aware of having troubles expressing myself" , but denies recent head trauma, falls, or new medications.   CT brain and MRI brain disclosed a small focus of hemorrhage left frontal white matter that appears chronic  with hemosiderin deposition. MRA brain unremarkable. She takes a baby aspirin every other day. Complains of " head fullness" at this moment, but admits that she has working a lot and stressed in the past couple of weeks.Patient was not a TPA candidate secondary to delay in arrival.  SUBJECTIVE Her husband and pastor are at the bedside.  Overall she feels her condition is stable.   OBJECTIVE Most recent Vital Signs: Filed Vitals:   05/15/12 0000 05/15/12 0400 05/15/12 0739 05/15/12 0950  BP: 132/80 132/85 156/88 140/87  Pulse: 71 73 60 64  Temp: 98.2 F (36.8 C) 97.6 F (36.4 C) 98 F (36.7 C)   TempSrc: Oral Oral Oral   Resp: 18 16 18    Height:      Weight:      SpO2: 97% 97% 96%    CBG (last 3)   Recent Labs  05/14/12 1630 05/14/12 2216 05/15/12 0735  GLUCAP 88 86 88    IV Fluid Intake:     MEDICATIONS  . aspirin EC  81 mg Oral Daily  .  labetalol  100 mg Oral BID   PRN:  acetaminophen, hydrALAZINE  Diet:  Cardiac thin liquids Activity:   Bathroom privileges with assistance DVT Prophylaxis:  SCDs   CLINICALLY SIGNIFICANT STUDIES Basic Metabolic Panel:  Recent Labs Lab 05/13/12 2058  NA 140  K 3.8  CL 105  CO2 25  GLUCOSE 90  BUN 13  CREATININE 0.74  CALCIUM 10.5   Liver Function Tests: No results found for this basename: AST, ALT, ALKPHOS, BILITOT, PROT, ALBUMIN,  in the last 168 hours CBC:  Recent Labs Lab 05/13/12 2058  WBC 7.0  NEUTROABS 4.4  HGB 14.5  HCT 43.6  MCV 87.7  PLT 211   Coagulation: No results found for this basename: LABPROT, INR,  in the last 168 hours Cardiac Enzymes:  Recent Labs Lab 05/13/12 2058  TROPONINI <0.30   Urinalysis:  Recent Labs Lab 05/13/12 2145  COLORURINE YELLOW  LABSPEC 1.015  PHURINE 5.5  GLUCOSEU NEGATIVE  HGBUR NEGATIVE  BILIRUBINUR NEGATIVE  KETONESUR NEGATIVE  PROTEINUR NEGATIVE  UROBILINOGEN 0.2  NITRITE NEGATIVE  LEUKOCYTESUR NEGATIVE   Lipid Panel    Component Value Date/Time   CHOL 216* 05/15/2012 0539   TRIG 98 05/15/2012 0539   HDL 62 05/15/2012 0539   CHOLHDL 3.5 05/15/2012 0539   VLDL 20 05/15/2012 0539   LDLCALC 134* 05/15/2012 0539   HgbA1C  Lab Results  Component Value Date  HGBA1C 5.6 05/14/2012    Urine Drug Screen:   No results found for this basename: labopia, cocainscrnur, labbenz, amphetmu, thcu, labbarb    Alcohol Level: No results found for this basename: ETH,  in the last 168 hours  CT of the brain  05/13/2012  Tiny focus of increased attenuation in the left anterior frontal deep white matter may represent focal hemorrhage or early dystrophic calcification.  MRI of the brain  05/14/2012  Small focus of hemorrhage left frontal white matter appears chronic with hemosiderin deposition.  No acute infarct.   MRA of the brain  05/14/2012  Negative  2D Echocardiogram  EF 55-60% with no source of embolus.   Carotid Doppler   There is no obvious evidence of hemodynamically significant internal carotid artery stenosis >40%. There appears to be an extracranial internal carotid artery branch with high resistance flow. Vertebral arteries are patent with antegrade flow.  CXR    EKG  normal EKG, normal sinus rhythm, occasional PVC noted, unifocal.   Therapy Recommendations no therapy needs  Physical Exam   Pleasant middle aged lady not in distress.Awake alert. Afebrile. Head is nontraumatic. Neck is supple without bruit. Hearing is normal. Cardiac exam no murmur or gallop. Lungs are clear to auscultation. Distal pulses are well felt. Neurological Exam ;  Awake  Alert oriented x 3. Normal speech and language.eye movements full without nystagmus.fundi were not visualized. Vision acuity and fields appear normal. Hearing is normal. Palatal movements are normal. Face symmetric. Tongue midline. Normal strength, tone, reflexes and coordination. Normal sensation. Gait deferred. ASSESSMENT Kristin [name] is a 57 y.o. female presenting with syncope, difficulty expressing herself. Imaging confirms an small left frontal hemorrhage, ? subacute. Hemorrhage felt to be secondary to long standing hypertension.  On aspirin 81 mg 3x a week prior to admission. Now on aspirin 81 mg orally every day for secondary stroke prevention. Patient with no resultant neuro deficits.   Hypertension Hyperlipidemia, LDL 134, not on statin PTA  Hospital day # 2  TREATMENT/PLAN  Continue aspirin 81 mg orally every day for secondary stroke prevention.  Repeat CT. If stable, of for discharge later today  Add statin  OP BP monitoring recommended  Follow up Dr. Pearlean Brownie in 2 months.  Annie Main, MSN, RN, ANVP-BC, ANP-BC, [facility] Pager: [phone] 05/15/2012 11:09 AM  I have personally obtained a history, examined the patient, evaluated imaging results, and formulated the assessment and plan of care. I agree with the  above.  Delia Heady, MD

## 2012-05-22 ENCOUNTER — Encounter: Payer: Self-pay | Admitting: *Deleted

## 2012-05-24 ENCOUNTER — Encounter: Payer: Self-pay | Admitting: Family Medicine

## 2012-05-24 ENCOUNTER — Ambulatory Visit (INDEPENDENT_AMBULATORY_CARE_PROVIDER_SITE_OTHER): Payer: 59 | Admitting: Family Medicine

## 2012-05-24 VITALS — BP 128/64 | HR 70 | Temp 98.5°F | Wt 164.6 lb

## 2012-05-24 DIAGNOSIS — G459 Transient cerebral ischemic attack, unspecified: Secondary | ICD-10-CM

## 2012-05-24 NOTE — Patient Instructions (Signed)
Please hold off on aspitin until you see the neurologis

## 2012-05-24 NOTE — Progress Notes (Signed)
  Subjective:    Patient ID: Kristin [name], female    DOB: 05/26/1955, 57 y.o.   MRN: [id number]  Hypertension   Patient arrives office for what turns out to be a very extensive discussion. She had an episode of near syncope. This was followed by a sense of confusion and a bit of difficulty with speech. Thisshewentontotheemergencyroom.Patientwasadmittedtothehospital. Initially she went to the local hospital, but then went on to [facility]. She did not see a neurologist. She had extensive testing which is all reviewed today and discuss with the patient in plane terms. Patient notes ongoing difficulty with speech. Early on she felt a bit lightheaded and a bit groggy. She had mild headaches but now feels better. Prior medical history positive for hypertension and cancer of the colon.  Review of Systems    ROS otherwise negative. Objective:   Physical Exam  Alert no acute distress. Speech slightly slurred, very subtle. Vitals reviewed. Lungs clear. Heart regular rate and rhythm. Neuro exam no cerebellar dysfunction. Fine motor control good. Strength good. Gait good. Within normal limits. No obvious focal deficits.  All hospital notes tests consultations etc. reviewed at length    Assessment & Plan:  Impression #1 hemorrhagic stroke-discussed with the patient. Despite hospitalization and despite visits from hospitalist, patient claims that no one told her about this. #2 hypertension. #3 hyperlipidemia medications recently initiated. Does stroke hemorrhagic in nature, statins will benefit somewhat. Discussed. Plan speech referral. Physical therapy referral. Neurology referral. Rationale discussed. Medications clarify. Easily 40 minutes spent most in discussion. WSL

## 2012-05-25 ENCOUNTER — Other Ambulatory Visit: Payer: Self-pay | Admitting: *Deleted

## 2012-05-25 ENCOUNTER — Encounter: Payer: Self-pay | Admitting: *Deleted

## 2012-05-25 ENCOUNTER — Encounter (HOSPITAL_COMMUNITY): Payer: 59 | Attending: Oncology

## 2012-05-25 DIAGNOSIS — C180 Malignant neoplasm of cecum: Secondary | ICD-10-CM

## 2012-05-25 DIAGNOSIS — G459 Transient cerebral ischemic attack, unspecified: Secondary | ICD-10-CM

## 2012-05-25 DIAGNOSIS — C189 Malignant neoplasm of colon, unspecified: Secondary | ICD-10-CM | POA: Insufficient documentation

## 2012-05-25 LAB — CEA: CEA: 0.6 ng/mL (ref 0.0–5.0)

## 2012-05-25 NOTE — Progress Notes (Signed)
Labs drawn today for cea 

## 2012-05-25 NOTE — Progress Notes (Unsigned)
Physical therapy and speech therapy scheduled [facility] May 20th 1:45 register 1:30.

## 2012-06-05 ENCOUNTER — Ambulatory Visit (HOSPITAL_COMMUNITY)
Admission: RE | Admit: 2012-06-05 | Discharge: 2012-06-05 | Disposition: A | Discharge status: Home / Self Care | Payer: 59 | Source: Ambulatory Visit | Attending: Family Medicine | Admitting: Family Medicine

## 2012-06-05 DIAGNOSIS — IMO0001 Reserved for inherently not codable concepts without codable children: Secondary | ICD-10-CM | POA: Insufficient documentation

## 2012-06-05 DIAGNOSIS — I69919 Unspecified symptoms and signs involving cognitive functions following unspecified cerebrovascular disease: Secondary | ICD-10-CM | POA: Insufficient documentation

## 2012-06-05 NOTE — Evaluation (Signed)
Speech Language Pathology Evaluation Patient Details  Name: Kristin [name] MRN: [id number] Date of Birth: 09/17/1955  Today's Date: 06/05/2012 Time:  -   2.40 pm    Authorization:    Authorization Time Period:    Authorization Visit#:   of     Past Medical History:  Past Medical History  Diagnosis Date  . Lyme disease 2010  . Complication of anesthesia     "need childsized tube to go down my throat/dr in 2008" (05/14/2012)  . Hypertension 05/14/2012    "starting today" (05/14/2012)  . Adenocarcinoma of colon 2008   Past Surgical History:  Past Surgical History  Procedure Laterality Date  . Colon resection  2008  . Colonoscopy  10/12/2010    Procedure: COLONOSCOPY;  Surgeon: Dalia Heading;  Location: [facility];  Service: Gastroenterology;  Laterality: N/A;  . Colon surgery    . Wisdom tooth extraction  2000's    "2" (05/14/2012)   HPI:  Symptoms/Limitations Symptoms: had mini stroke 05/13/12 Pain Assessment Currently in Pain?: No/denies  Prior Functional Status  Cognitive/Linguistic Baseline: Within functional limits Type of Home: House Lives With: Spouse Vocation: Works at [employer] Has the patient fallen in the past 6 months: No Has the patient had a decrease in activity level because of a fear of falling? : No Is the patient reluctant to leave their home because of a fear of falling? : No  Cognition  Overall Cognitive Status: Impaired/Different from baseline Arousal/Alertness: Awake/alert Orientation Level: Oriented X4 Attention: Focused Focused Attention: Appears intact Memory: Impaired Memory Impairment: Decreased short term memory (decreased for functional memory) Decreased Short Term Memory: Verbal complex Awareness: Appears intact Problem Solving: Appears intact Executive Function: Landscape architect: Impaired (difficulty with Electrical engineer) Organizing Impairment: Functional complex Safety/Judgment: Appears  intact  Comprehension  Auditory Comprehension Overall Auditory Comprehension: Appears within functional limits for tasks assessed Visual Recognition/Discrimination Discrimination: Within Function Limits Reading Comprehension Reading Status: Impaired Word level: Within functional limits Sentence Level: Within functional limits Paragraph Level: Impaired (reports difficulty retaining info for long passages    ) Functional Environmental (signs, name badge): Within functional limits Interfering Components: Working memory  Expression  Expression Primary Mode of Expression: Verbal Verbal Expression Overall Verbal Expression: Impaired Initiation: No impairment Level of Generative/Spontaneous Verbalization: Conversation Repetition: No impairment Naming: No impairment Pragmatics: No impairment Non-Verbal Means of Communication: Not applicable Other Verbal Expression Comments: high level anomia especially with specific names  patient is frustrated with this  Oral/Motor     SLP Goals  Home Exercise SLP Goal: Patient will Perform Home Exercise Program: Independently SLP Short Term Goals SLP Short Term Goal 1: patient will utilize strategies and techniques to aid memory/recall 90% of time without cues SLP Short Term Goal 2: patient will perform finance management independently SLP Short Term Goal 3: patient will utilize strategies to aid recall at conversational level without cues SLP Long Term Goals SLP Long Term Goal 1: patient will improve memory skills via techniques and strategies to return to prior level SLP Long Term Goal 2: patient will improve word recall at conversational level to 90%   Assessment/Plan  Patient Active Problem List   Diagnosis Date Noted  . TIA (transient ischemic attack) 05/14/2012  . Stroke, hemorrhagic 05/14/2012  . HTN (hypertension) 05/14/2012  . Adenocarcinoma of colon 09/02/2010  . Lyme disease 09/02/2010   SLP - End of Session Activity  Tolerance: Patient tolerated treatment well General Behavior During Therapy: [facility] for tasks  assessed/performed  SLP Assessment/Plan Clinical Impression Statement: patient exhibiting min STM/cognition deficits affecting finance mgmt and household functions and high level word recall at conversational level for specific information   Patient is frustrated with deficits but is not highly motivated for therapy intervention   Educated in strats and techniques to aid memory/cognition and recall and patient is to return in 2 weeks to assess  progress and need for further ST interventions Speech Therapy Frequency: Other (Comment) (2x month) Duration: Other (comment) Treatment/Interventions: Compensatory techniques;Patient/family education;Cognitive reorganization;Language facilitation Potential to Achieve Goals: Good  GN    Meda Klinefelter 06/05/2012, 4:39 PM  Physician Documentation Your signature is required to indicate approval of the treatment plan as stated above.  Please sign and either send electronically or make a copy of this report for your files and return this physician signed original.  Please mark one 1.__approve of plan  2. ___approve of plan with the following conditions.   ______________________________                                                          _____________________ Physician Signature                                                                                                             Date

## 2012-06-21 ENCOUNTER — Encounter: Payer: Self-pay | Admitting: Family Medicine

## 2012-06-21 ENCOUNTER — Ambulatory Visit (INDEPENDENT_AMBULATORY_CARE_PROVIDER_SITE_OTHER): Payer: 59 | Admitting: Family Medicine

## 2012-06-21 VITALS — BP 110/74 | HR 70 | Wt 164.0 lb

## 2012-06-21 DIAGNOSIS — I10 Essential (primary) hypertension: Secondary | ICD-10-CM

## 2012-06-21 DIAGNOSIS — E213 Hyperparathyroidism, unspecified: Secondary | ICD-10-CM | POA: Insufficient documentation

## 2012-06-21 DIAGNOSIS — Z79899 Other long term (current) drug therapy: Secondary | ICD-10-CM

## 2012-06-21 DIAGNOSIS — I619 Nontraumatic intracerebral hemorrhage, unspecified: Secondary | ICD-10-CM

## 2012-06-21 DIAGNOSIS — E785 Hyperlipidemia, unspecified: Secondary | ICD-10-CM | POA: Insufficient documentation

## 2012-06-21 NOTE — Progress Notes (Signed)
  Subjective:    Patient ID: Kristin [name], female    DOB: Jan 23, 1955, 57 y.o.   MRN: [id number]  HPI  Due to have an eeg soon. Salt and the neurologist. All of his notes are read and reviewed. And also discussed with patient. No further acute neurological symptoms. Patient reports her speech continues to improve. She did see the physical and speech therapist. He advised no further therapy. She reports her strength is improving. She also reports that her speech is improving. At times when fatigued she still has difficulty with certain words.  Patient claims compliance with blood pressure medication. No chest pain or headache. Decent appetite. Walking some. Watch her salt intake.  Patient reports compliance with her lipid medicine. Handling well. Trying to watch her diet closely.  Patient was told in the past that she may have a parathyroid hormone tumor and may well need surgery. She declined further evaluation because of noninsured status. She would like to explore now.  Review of Systems    as noted above 12 system ROS otherwise negative. Results for orders placed in visit on 05/25/12  CEA      Result Value Range   CEA 0.6  0.0 - 5.0 ng/mL    Objective:   Physical Exam  Alert no acute distress. Speech clear clarity good. Able to choose proper words. No focal deficits. Lungs clear. Heart regular rate and rhythm. Cerebellar function intact.      Assessment & Plan:  Impression 1 hypertension good control. #2 hyperlipidemia status uncertain. #3 hemorrhagic stroke equivalent Clinically improved. #4 history of elevated calcium and concern regarding parathyroid hormone tumor potential. Plan appropriate blood work. Maintain same medications. Diet exercise discussed. Recheck in 3 months. Further recommendations based on blood work results.

## 2012-06-21 NOTE — Patient Instructions (Signed)
Please get the blood work fasting

## 2012-06-26 ENCOUNTER — Telehealth (HOSPITAL_COMMUNITY): Payer: Self-pay

## 2012-06-28 ENCOUNTER — Ambulatory Visit (HOSPITAL_COMMUNITY): Payer: 59 | Admitting: Speech Pathology

## 2012-07-10 LAB — LIPID PANEL
HDL: 60 mg/dL (ref >39)
Total CHOL/HDL Ratio: 2.6 Ratio
VLDL: 13 mg/dL (ref 0–40)

## 2012-07-10 LAB — HEPATIC FUNCTION PANEL
ALT: 22 U/L (ref 0–35)
AST: 22 U/L (ref 0–37)
Albumin: 4 g/dL (ref 3.5–5.2)
Bilirubin, Direct: 0.1 mg/dL (ref 0.0–0.3)
Total Bilirubin: 0.4 mg/dL (ref 0.3–1.2)

## 2012-07-10 LAB — CALCIUM: Calcium: 10.2 mg/dL (ref 8.4–10.5)

## 2012-07-11 LAB — PTH, INTACT AND CALCIUM: PTH: 118.3 pg/mL — ABNORMAL HIGH (ref 14.0–72.0)

## 2012-07-17 ENCOUNTER — Telehealth: Payer: Self-pay | Admitting: Family Medicine

## 2012-07-17 ENCOUNTER — Other Ambulatory Visit: Payer: Self-pay | Admitting: Family Medicine

## 2012-07-17 NOTE — Telephone Encounter (Signed)
Does not matter how the medication prescription is sent to pharmacy.  Patient states the co-pay is the same.  States this was her homework from Dr. Gerda Diss.

## 2012-07-18 ENCOUNTER — Other Ambulatory Visit: Payer: Self-pay | Admitting: *Deleted

## 2012-08-15 ENCOUNTER — Other Ambulatory Visit: Payer: Self-pay | Admitting: Family Medicine

## 2012-08-20 ENCOUNTER — Other Ambulatory Visit: Payer: Self-pay | Admitting: Neurology

## 2012-08-20 DIAGNOSIS — I619 Nontraumatic intracerebral hemorrhage, unspecified: Secondary | ICD-10-CM

## 2012-08-20 DIAGNOSIS — D1800 Hemangioma unspecified site: Secondary | ICD-10-CM

## 2012-08-31 ENCOUNTER — Ambulatory Visit (HOSPITAL_COMMUNITY)
Admission: RE | Admit: 2012-08-31 | Discharge: 2012-08-31 | Disposition: A | Discharge status: Home / Self Care | Payer: 59 | Source: Ambulatory Visit | Attending: Neurology | Admitting: Neurology

## 2012-08-31 DIAGNOSIS — D1800 Hemangioma unspecified site: Secondary | ICD-10-CM

## 2012-08-31 DIAGNOSIS — I619 Nontraumatic intracerebral hemorrhage, unspecified: Secondary | ICD-10-CM | POA: Insufficient documentation

## 2012-08-31 DIAGNOSIS — I676 Nonpyogenic thrombosis of intracranial venous system: Secondary | ICD-10-CM | POA: Insufficient documentation

## 2012-09-21 ENCOUNTER — Ambulatory Visit (INDEPENDENT_AMBULATORY_CARE_PROVIDER_SITE_OTHER): Payer: 59 | Admitting: Family Medicine

## 2012-09-21 ENCOUNTER — Encounter: Payer: Self-pay | Admitting: Family Medicine

## 2012-09-21 VITALS — BP 118/78 | Ht 66.5 in | Wt 167.0 lb

## 2012-09-21 DIAGNOSIS — E213 Hyperparathyroidism, unspecified: Secondary | ICD-10-CM

## 2012-09-21 DIAGNOSIS — I10 Essential (primary) hypertension: Secondary | ICD-10-CM

## 2012-09-21 DIAGNOSIS — E785 Hyperlipidemia, unspecified: Secondary | ICD-10-CM

## 2012-09-21 DIAGNOSIS — G459 Transient cerebral ischemic attack, unspecified: Secondary | ICD-10-CM

## 2012-09-21 NOTE — Progress Notes (Signed)
  Subjective:    Patient ID: Kristin [name], female    DOB: Mar 29, 1955, 57 y.o.   MRN: [id number]  Hypertension This is a chronic problem. The current episode started more than 1 year ago. The problem has been waxing and waning since onset. The problem is uncontrolled. Pertinent negatives include no anxiety or chest pain. There are no known risk factors for coronary artery disease. Past treatments include beta blockers and lifestyle changes. The current treatment provides moderate improvement. Compliance problems include exercise.  There is no history of angina, CAD/MI or CVA.    Patient claims compliance with her statin medication. No obvious side effects. Not watching diet as closely as she had hoped. Starting to exercise some.  History of stroke. No further neurological symptomatology.  Currently in the midst of workup and management of hyperparathyroidism. Due to see a surgeon soon. Some fatigue with elevated calcium.  A lot of stress lately.  Hoarseness. Intermittent. 6 weeks duration. Comes and goes. Most in time her voice is completely fine. No associated cough or congestion. No associated reflux.  Review of Systems  Cardiovascular: Negative for chest pain.   No shortness breath no abdominal pain no back pain ROS otherwise negative    Objective:   Physical Exam  Alert no acute distress. Lungs clear. Heart regular rate rhythm. HEENT normal. Ankles without edema. Blood pressure good on repeat. No current hoarseness today. Neck supple no lymphadenopathy no masses or lumps      Assessment & Plan:  Impression 1 hypertension good control. #2 hyperlipidemia and discuss. #3 intermittent hoarseness clinically stable now discussed. #4 hyperparathyroidism discussed. #5 history of stroke clinically stable plan maintain same meds. Diet exercise discussed. Check in 6 months. 25 minutes spent most in discussion. If hoarseness comes and stays for longer than a few weeks give [place] a call. WSL

## 2012-09-24 ENCOUNTER — Other Ambulatory Visit (HOSPITAL_COMMUNITY): Payer: Self-pay | Admitting: Endocrinology

## 2012-09-24 DIAGNOSIS — E213 Hyperparathyroidism, unspecified: Secondary | ICD-10-CM

## 2012-10-02 ENCOUNTER — Encounter (INDEPENDENT_AMBULATORY_CARE_PROVIDER_SITE_OTHER): Payer: Self-pay | Admitting: Surgery

## 2012-10-04 ENCOUNTER — Encounter (HOSPITAL_COMMUNITY)
Admission: RE | Admit: 2012-10-04 | Discharge: 2012-10-04 | Disposition: A | Discharge status: Home / Self Care | Payer: 59 | Source: Ambulatory Visit | Attending: Endocrinology | Admitting: Endocrinology

## 2012-10-04 ENCOUNTER — Encounter (HOSPITAL_COMMUNITY): Payer: Self-pay

## 2012-10-04 DIAGNOSIS — E213 Hyperparathyroidism, unspecified: Secondary | ICD-10-CM | POA: Insufficient documentation

## 2012-10-04 MED ORDER — TECHNETIUM TC 99M SESTAMIBI - CARDIOLITE
25.2000 | Freq: Once | INTRAVENOUS | Status: AC | PRN
  Administered 2012-10-04: 25.2 via INTRAVENOUS

## 2012-10-09 ENCOUNTER — Ambulatory Visit (INDEPENDENT_AMBULATORY_CARE_PROVIDER_SITE_OTHER): Payer: 59 | Admitting: Surgery

## 2012-10-09 ENCOUNTER — Encounter (INDEPENDENT_AMBULATORY_CARE_PROVIDER_SITE_OTHER): Payer: Self-pay | Admitting: Surgery

## 2012-10-09 ENCOUNTER — Ambulatory Visit
Admission: RE | Admit: 2012-10-09 | Discharge: 2012-10-09 | Disposition: A | Discharge status: Home / Self Care | Payer: 59 | Source: Ambulatory Visit | Attending: Surgery | Admitting: Surgery

## 2012-10-09 VITALS — BP 128/84 | HR 68 | Resp 16 | Ht 66.0 in | Wt 168.4 lb

## 2012-10-09 DIAGNOSIS — E213 Hyperparathyroidism, unspecified: Secondary | ICD-10-CM

## 2012-10-09 MED ORDER — VITAMIN D (ERGOCALCIFEROL) 1.25 MG (50000 UNIT) PO CAPS: 50000.0000 [IU] | ORAL_CAPSULE | ORAL | Status: DC

## 2012-10-09 NOTE — Patient Instructions (Signed)
Parathyroid Hormone This is a test to determine whether PTH levels are responding normally to changes in blood calcium levels. It also helps to distinguish the cause of calcium imbalances, and to evaluate parathyroid function. When calcium blood levels are higher or lower than normal, and when your caregiver may want to determine how well your parathyroid glands are working. Parathyroid hormone (PTH) helps the body maintain stable levels of calcium in the blood. It is part of a 'feedback loop' that includes calcium, PTH, vitamin D, and, to some extent, phosphate and magnesium. Conditions and diseases that disrupt this feedback loop can cause inappropriate elevations or decreases in calcium and PTH levels and lead to symptoms of hypercalcemia (raised blood levels of calcium) or hypocalcemia (low blood levels of calcium).  PTH is produced by four parathyroid glands that are located in the neck beside the thyroid gland. Normally, these glands secrete PTH into the bloodstream in response to low blood calcium levels. Parathyroid hormone then works in three ways to help raise blood calcium levels back to normal. It takes calcium from the body's bone, stimulates the activation of vitamin D in the kidney (which in turn increases the absorption of calcium from the intestines), and suppresses the excretion of calcium in the urine (while encouraging excretion of phosphate). As calcium levels begin to increase in the blood, PTH normally decreases. PREPARATION FOR TEST You should have nothing to eat or drink except for water after midnight on the day of the test or as directed by your caregiver. A blood sample is obtained by inserting a needle into a vein in the arm. NORMAL FINDINGS Conventional Normal  PTH intact (whole)  Assay includes intact PTH  Values (pg/mL)10-65  SI Units (ng/L)10-65  PTH N-terminalN-terminal  Values (pg/mL) 8-24  SI Units (ng/L)8-24  PTH C-terminal  Assay Includes  C-terminal  Values (pg/mL) 50-330  SI Units (ng/L) 50-330  Intact PTH  Midmolecule Ranges for normal findings may vary among different laboratories and hospitals. You should always check with your doctor after having lab work or other tests done to discuss the meaning of your test results and whether your values are considered within normal limits. MEANING OF TEST  Your caregiver will go over the test results with you and discuss the importance and meaning of your results, as well as treatment options and the need for additional tests if necessary. OBTAINING THE TEST RESULTS  It is your responsibility to obtain your test results. Ask the lab or department performing the test when and how you will get your results. Document Released: 02/06/2004 Document Revised: 03/28/2011 Document Reviewed: 12/16/2007 [employer] Patient Information 2014 [employer].  

## 2012-10-09 NOTE — Progress Notes (Signed)
General Surgery [facility]  Chief Complaint  Patient presents with  . New Evaluation    eval hyperparathyroidism - referral from Dr. Dorisann Frames    HISTORY: Patient is a 57 year old female who presents on referral from her endocrinologist for evaluation of possible primary hyperparathyroidism. Patient was noted early this year with hypercalcemia. She has a personal history of colon cancer. Laboratory studies have shown intact PTH levels ranging from normal at 52 to a high of 118. Calcium level was slightly elevated at 10.3. 25 hydroxy vitamin D level was slightly low at 26.  Patient has no history of nephrolithiasis. Bone density scanning has shown osteopenia. Patient complains of chronic fatigue.  There is no family history of parathyroid disease. There is no history of other endocrinopathy. There is no history of endocrine neoplasm.  Patient underwent nuclear medicine parathyroid scan last week. This study fails to localize any evidence of parathyroid adenoma.  Past Medical History  Diagnosis Date  . Lyme disease 2010  . Complication of anesthesia     "need childsized tube to go down my throat/dr in 2008" (05/14/2012)  . Hypertension 05/14/2012    "starting today" (05/14/2012)  . Adenocarcinoma of colon 2008  . Hyperlipidemia   . Anemia     Current Outpatient Prescriptions  Medication Sig Dispense Refill  . labetalol (NORMODYNE) 100 MG tablet TAKE 1 TABLET BY MOUTH TWICE DAILY  60 tablet  5  . pravastatin (PRAVACHOL) 40 MG tablet TAKE 1 TABLET BY MOUTH DAILY  30 tablet  5  . Vitamin D, Ergocalciferol, (DRISDOL) 50000 UNITS CAPS capsule Take 1 capsule (50,000 Units total) by mouth every 7 (seven) days.  15 capsule  0   No current facility-administered medications for this visit.    No Known Allergies  Family History  Problem Relation Age of Onset  . Cancer Maternal Grandfather   . Hypertension Mother   . Heart attack Father     History   Social  History  . Marital Status: Married    Spouse Name: N/A    Number of Children: N/A  . Years of Education: N/A   Social History Main Topics  . Smoking status: Never Smoker   . Smokeless tobacco: Never Used  . Alcohol Use: No  . Drug Use: No  . Sexual Activity: Yes   Other Topics Concern  . None   Social History Narrative  . None    REVIEW OF SYSTEMS - PERTINENT POSITIVES ONLY: Chronic fatigue. Denies nephrolithiasis. Personal history of osteopenia.  EXAM: Filed Vitals:   10/09/12 0954  BP: 128/84  Pulse: 68  Resp: 16    HEENT: normocephalic; pupils equal and reactive; sclerae clear; dentition good; mucous membranes moist NECK:  symmetric on extension; no palpable anterior or posterior cervical lymphadenopathy; no supraclavicular masses; no tenderness CHEST: clear to auscultation bilaterally without rales, rhonchi, or wheezes CARDIAC: regular rate and rhythm without significant murmur; peripheral pulses are full EXT:  non-tender without edema; no deformity NEURO: no gross focal deficits; no sign of tremor   LABORATORY RESULTS: See [facility] (CHL-Epic) for most recent results  RADIOLOGY RESULTS: See [facility] (CHL-Epic) for most recent results  IMPRESSION: #1 hypercalcemia, rule out primary hyperparathyroidism #2 mild vitamin D deficiency #3 osteopenia  PLAN: The patient and I reviewed her studies as noted above. I provided her with written literature on parathyroid disease to review.  The nuclear medicine parathyroid scan is negative for parathyroid adenoma. We will request a ultrasound examination  of the neck in hopes of identifying a parathyroid adenoma.  I'm going to start her on ergocalciferol 50,000 units weekly for 4 months. If her ultrasound examination is unrevealing, we will wait 6 months and repeat her calcium level, vitamin D level, and intact PTH level and then see her here in the office for evaluation.  Velora Heckler, MD, FACS General &  Endocrine Surgery [facility]  Primary Care Physician: Harlow Asa, MD

## 2012-10-10 ENCOUNTER — Telehealth (INDEPENDENT_AMBULATORY_CARE_PROVIDER_SITE_OTHER): Payer: Self-pay

## 2012-10-10 NOTE — Telephone Encounter (Signed)
Message copied by Joanette Gula on Wed Oct 10, 2012 10:22 AM ------      Message from: Velora Heckler      Created: Wed Oct 10, 2012  9:20 AM       No sign of parathyroid adenoma.  Will treat with ergocalciferol for 3-4 months and repeat lab work in 6 months.            Velora Heckler, MD, [facility]      Office: [phone]             ------

## 2012-10-10 NOTE — Telephone Encounter (Signed)
Patient called back and was given below message.  Patient states understanding and agreeable with plan at this time.

## 2012-10-10 NOTE — Telephone Encounter (Signed)
LMOM for pt to call. Pt will need to be advised of test result response attached by Dr Gerrit Friends. Will need to be sure pt has rx for Ergocalciferol.  I will mail pt lab slip to repeat labs in 6 months.

## 2012-11-20 ENCOUNTER — Encounter (INDEPENDENT_AMBULATORY_CARE_PROVIDER_SITE_OTHER): Payer: Self-pay

## 2012-11-22 ENCOUNTER — Other Ambulatory Visit: Payer: Self-pay

## 2012-11-27 ENCOUNTER — Encounter (HOSPITAL_COMMUNITY): Payer: 59 | Attending: Hematology and Oncology

## 2012-11-27 DIAGNOSIS — C180 Malignant neoplasm of cecum: Secondary | ICD-10-CM

## 2012-11-27 DIAGNOSIS — Z85038 Personal history of other malignant neoplasm of large intestine: Secondary | ICD-10-CM | POA: Insufficient documentation

## 2012-11-27 DIAGNOSIS — Z09 Encounter for follow-up examination after completed treatment for conditions other than malignant neoplasm: Secondary | ICD-10-CM | POA: Insufficient documentation

## 2012-11-27 NOTE — Progress Notes (Signed)
Labs drawn today for cea 

## 2012-11-28 ENCOUNTER — Ambulatory Visit (HOSPITAL_COMMUNITY): Payer: Self-pay | Admitting: Oncology

## 2012-11-30 ENCOUNTER — Other Ambulatory Visit (HOSPITAL_COMMUNITY): Payer: Self-pay

## 2012-12-04 ENCOUNTER — Encounter (HOSPITAL_COMMUNITY): Payer: Self-pay

## 2012-12-04 ENCOUNTER — Encounter (HOSPITAL_BASED_OUTPATIENT_CLINIC_OR_DEPARTMENT_OTHER): Payer: 59

## 2012-12-04 VITALS — BP 111/74 | HR 74 | Temp 98.0°F | Resp 16 | Wt 169.8 lb

## 2012-12-04 DIAGNOSIS — C189 Malignant neoplasm of colon, unspecified: Secondary | ICD-10-CM

## 2012-12-04 DIAGNOSIS — Z85038 Personal history of other malignant neoplasm of large intestine: Secondary | ICD-10-CM

## 2012-12-04 LAB — CBC WITH DIFFERENTIAL/PLATELET
Basophils Absolute: 0 10*3/uL (ref 0.0–0.1)
Basophils Relative: 1 % (ref 0–1)
Eosinophils Absolute: 0.1 10*3/uL (ref 0.0–0.7)
Eosinophils Relative: 1 % (ref 0–5)
HCT: 42.6 % (ref 36.0–46.0)
MCHC: 32.6 g/dL (ref 30.0–36.0)
MCV: 92.6 fL (ref 78.0–100.0)
Monocytes Absolute: 0.5 10*3/uL (ref 0.1–1.0)
Platelets: 207 10*3/uL (ref 150–400)
RDW: 14.1 % (ref 11.5–15.5)

## 2012-12-04 LAB — COMPREHENSIVE METABOLIC PANEL
AST: 20 U/L (ref 0–37)
Albumin: 3.8 g/dL (ref 3.5–5.2)
Calcium: 10.7 mg/dL — ABNORMAL HIGH (ref 8.4–10.5)
Creatinine, Ser: 0.7 mg/dL (ref 0.50–1.10)
GFR calc non Af Amer: >90 mL/min (ref >90)
Sodium: 140 mEq/L (ref 135–145)
Total Protein: 7.3 g/dL (ref 6.0–8.3)

## 2012-12-04 NOTE — Progress Notes (Signed)
[facility]  OFFICE PROGRESS NOTE  Harlow Asa, MD [address] [place] [address]  DIAGNOSIS: Colon cancer - Plan: CBC with Differential, Comprehensive metabolic panel, CBC with Differential, Comprehensive metabolic panel, CBC with Differential, Comprehensive metabolic panel, CEA, US Breast Bilateral, MM Digital Diagnostic Bilat  Chief Complaint: Colon cancer survivalence  CURRENT THERAPY: Observation   INTERVAL HISTORY:  Kristin [name] 57 y.o. 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 Franky Macho on 09/13/2006 with the finding of 28 benign lymph nodes. No LV I was seen and she was not given adjuvant chemotherapy. She is just being observed. She has no evidence recurrence. She had colonoscopy in September 2012 and was negative for malignancy.  She denies any  dizziness, double vision, fevers, chills, night sweats, nausea, vomiting, diarrhea, constipation, chest pain, heart palpitations, shortness of breath, blood in stool, black tarry stool, urinary pain, urinary burning, urinary frequency, hematuria.  MEDICAL HISTORY: Past Medical History  Diagnosis Date  . Lyme disease 2010  . Complication of anesthesia     "need childsized tube to go down my throat/dr in 2008" (05/14/2012)  . Hypertension 05/14/2012    "starting today" (05/14/2012)  . Adenocarcinoma of colon 2008  . Hyperlipidemia   . Anemia   . 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 Hyperparathyroidism 01/2012    INTERIM HISTORY: has Adenocarcinoma of colon; Lyme disease; TIA (transient ischemic attack); Stroke, hemorrhagic; HTN (hypertension); Hyperparathyroidism; and Other and unspecified hyperlipidemia on her problem list.    ALLERGIES:  has No Known Allergies.  MEDICATIONS:  Current Outpatient Prescriptions  Medication Sig Dispense Refill  . labetalol (NORMODYNE) 100 MG tablet TAKE 1 TABLET BY MOUTH TWICE  DAILY  60 tablet  5  . pravastatin (PRAVACHOL) 40 MG tablet TAKE 1 TABLET BY MOUTH DAILY  30 tablet  5  . Vitamin D, Ergocalciferol, (DRISDOL) 50000 UNITS CAPS capsule Take 1 capsule (50,000 Units total) by mouth every 7 (seven) days.  15 capsule  0   No current facility-administered medications for this visit.    SURGICAL HISTORY:  Past Surgical History  Procedure Laterality Date  . Colon resection  2008  . Colonoscopy  10/12/2010    Procedure: COLONOSCOPY;  Surgeon: Dalia Heading;  Location: [facility];  Service: Gastroenterology;  Laterality: N/A;  . Colon surgery    . Wisdom tooth extraction  2000's    "2" (05/14/2012)  . Facial cosmetic surgery      eyelids    FAMILY HISTORY: family history includes Cancer in her maternal grandfather; Heart attack in her father; Hypertension in her mother.  SOCIAL HISTORY:  reports that she has never smoked. She has never used smokeless tobacco. She reports that she does not drink alcohol or use illicit drugs.  REVIEW OF SYSTEMS:  As mentioned ininterval history PHYSICAL EXAMINATION: ECOG PERFORMANCE STATUS: 0 - Asymptomatic  Blood pressure 111/74, pulse 74, temperature 98 F (36.7 C), temperature source Oral, resp. rate 16, weight 169 lb 12.8 oz (77.021 kg).  GENERAL:alert, no distress, well nourished and well developed SKIN: no rashes or significant lesions HEAD: Normocephalic EYES: PERRLA, EOMI, Conjunctiva are pink and non-injected, sclera clear EARS: External ears normal OROPHARYNX:no erythema, lips, buccal mucosa, and tongue normal and mucous membranes are moist  NECK: supple, no adenopathy, no JVD, no stridor, non-tender LYMPH:  no palpable lymphadenopathy, no hepatosplenomegaly BREAST:breasts-bilateral cystic lesions appreciated, no suspicious masses, no  skin or nipple changes or axillary nodes LUNGS: clear to auscultation , coarse sounds heard HEART: regular rate & rhythm ABDOMEN:abdomen soft, obese and normal bowel  sounds BACK: Back symmetric, no curvature. EXTREMITIES:no edema, no clubbing and no cyanosis  NEURO: alert & oriented x 3 with fluent speech, no focal motor/sensory deficits, gait normal   LABORATORY DATA: Office Visit on 12/04/2012  Component Date Value Range Status  . WBC 12/04/2012 6.0  4.0 - 10.5 K/uL Final  . RBC 12/04/2012 4.60  3.87 - 5.11 MIL/uL Final  . Hemoglobin 12/04/2012 13.9  12.0 - 15.0 g/dL Final  . HCT 16/10/9602 42.6  36.0 - 46.0 % Final  . MCV 12/04/2012 92.6  78.0 - 100.0 fL Final  . MCH 12/04/2012 30.2  26.0 - 34.0 pg Final  . MCHC 12/04/2012 32.6  30.0 - 36.0 g/dL Final  . RDW 54/09/8117 14.1  11.5 - 15.5 % Final  . Platelets 12/04/2012 207  150 - 400 K/uL Final  . Neutrophils Relative % 12/04/2012 73  43 - 77 % Final  . Neutro Abs 12/04/2012 4.4  1.7 - 7.7 K/uL Final  . Lymphocytes Relative 12/04/2012 18  12 - 46 % Final  . Lymphs Abs 12/04/2012 1.1  0.7 - 4.0 K/uL Final  . Monocytes Relative 12/04/2012 8  3 - 12 % Final  . Monocytes Absolute 12/04/2012 0.5  0.1 - 1.0 K/uL Final  . Eosinophils Relative 12/04/2012 1  0 - 5 % Final  . Eosinophils Absolute 12/04/2012 0.1  0.0 - 0.7 K/uL Final  . Basophils Relative 12/04/2012 1  0 - 1 % Final  . Basophils Absolute 12/04/2012 0.0  0.0 - 0.1 K/uL Final  . Sodium 12/04/2012 140  135 - 145 mEq/L Final  . Potassium 12/04/2012 3.9  3.5 - 5.1 mEq/L Final  . Chloride 12/04/2012 103  96 - 112 mEq/L Final  . CO2 12/04/2012 29  19 - 32 mEq/L Final  . Glucose, Bld 12/04/2012 84  70 - 99 mg/dL Final  . BUN 14/78/2956 14  6 - 23 mg/dL Final  . Creatinine, Ser 12/04/2012 0.70  0.50 - 1.10 mg/dL Final  . Calcium 21/30/8657 10.7* 8.4 - 10.5 mg/dL Final  . Total Protein 12/04/2012 7.3  6.0 - 8.3 g/dL Final  . Albumin 84/69/6295 3.8  3.5 - 5.2 g/dL Final  . AST 28/41/3244 20  0 - 37 U/L Final  . ALT 12/04/2012 20  0 - 35 U/L Final  . Alkaline Phosphatase 12/04/2012 68  39 - 117 U/L Final  . Total Bilirubin 12/04/2012 0.4  0.3  - 1.2 mg/dL Final  . GFR calc non Af Amer 12/04/2012 >90  >90 mL/min Final  . GFR calc Af Amer 12/04/2012 >90  >90 mL/min Final   Comment: (NOTE)                          The eGFR has been calculated using the CKD EPI equation.                          This calculation has not been validated in all clinical situations.                          eGFR's persistently <90 mL/min signify possible Chronic Kidney  Disease.  Infusion on 11/27/2012  Component Date Value Range Status  . 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 Franky Macho on 09/13/2006 with the finding of 28 benign lymph nodes. No LV I was seen and she was not given adjuvant chemotherapy. She is just being observed. She has no evidence recurrence. She had colonoscopy in September 2012 and was negative for malignancy: Next colonoscopy due in 5 years. Her recent CEA level is within normal range. I have ordered a CBC and differential and CMP today.  2. Hypercalcemia : Followed by DR. Gerkin  3. Bilateral breast cystic lesions of the breast: Allowing for her diagnosed mammogram with ultrasound sonogram of the breast.  PLAN: CBC differential CMP and CEA level  Diagnostic mammogram with sonogram of the breast Follow up in one year with a CBC test, CMP and CEA level Follow up with that a  Lovell Sheehan are scheduled for surveillance colonoscopy Follow up with primary care physician as scheduled    All questions were answered. The patient knows to call the clinic with any problems, questions or concerns. We can certainly see the patient much sooner if necessary.   I spent  20 minutes counseling the patient face to face. The total time spent in the appointment was 30 minutes   Annamarie Dawley, MD 12/04/2012 5:08 PM

## 2012-12-04 NOTE — Patient Instructions (Signed)
[facility] Discharge Instructions  RECOMMENDATIONS MADE BY THE CONSULTANT AND ANY TEST RESULTS WILL BE SENT TO YOUR REFERRING PHYSICIAN.  EXAM FINDINGS BY THE PHYSICIAN TODAY AND SIGNS OR SYMPTOMS TO REPORT TO CLINIC OR PRIMARY PHYSICIAN: Exam and findings as discussed by Dr. Lajuana Ripple.  We will check some additional blood work today and if there are any concerns we will contact you.  Will also get you scheduled for your mammogram.  MEDICATIONS PRESCRIBED:  none  INSTRUCTIONS/FOLLOW-UP: Follow-up in 1 year.  Thank you for choosing [facility] to provide your oncology and hematology care.  To afford each patient quality time with our providers, please arrive at least 15 minutes before your scheduled appointment time.  With your help, our goal is to use those 15 minutes to complete the necessary work-up to ensure our physicians have the information they need to help with your evaluation and healthcare recommendations.    Effective January 1st, 2014, we ask that you re-schedule your appointment with our physicians should you arrive 10 or more minutes late for your appointment.  We strive to give you quality time with our providers, and arriving late affects you and other patients whose appointments are after yours.    Again, thank you for choosing [facility].  Our hope is that these requests will decrease the amount of time that you wait before being seen by our physicians.       _____________________________________________________________  Should you have questions after your visit to [facility], please contact our office at [phone] between the hours of 8:30 a.m. and 5:00 p.m.  Voicemails left after 4:30 p.m. will not be returned until the following business day.  For prescription refill requests, have your pharmacy contact our office with your prescription refill request.

## 2012-12-19 ENCOUNTER — Ambulatory Visit (HOSPITAL_COMMUNITY)
Admission: RE | Admit: 2012-12-19 | Discharge: 2012-12-19 | Disposition: A | Discharge status: Home / Self Care | Payer: 59 | Source: Ambulatory Visit | Attending: Hematology and Oncology | Admitting: Hematology and Oncology

## 2012-12-19 ENCOUNTER — Other Ambulatory Visit (HOSPITAL_COMMUNITY): Payer: Self-pay | Admitting: Hematology and Oncology

## 2012-12-19 DIAGNOSIS — N63 Unspecified lump in unspecified breast: Secondary | ICD-10-CM | POA: Insufficient documentation

## 2012-12-19 DIAGNOSIS — C189 Malignant neoplasm of colon, unspecified: Secondary | ICD-10-CM

## 2012-12-20 ENCOUNTER — Other Ambulatory Visit (HOSPITAL_COMMUNITY): Payer: Self-pay | Admitting: Hematology and Oncology

## 2012-12-20 DIAGNOSIS — N6310 Unspecified lump in the right breast, unspecified quadrant: Secondary | ICD-10-CM

## 2012-12-25 ENCOUNTER — Other Ambulatory Visit (HOSPITAL_COMMUNITY): Payer: Self-pay | Admitting: Hematology and Oncology

## 2012-12-25 DIAGNOSIS — N6310 Unspecified lump in the right breast, unspecified quadrant: Secondary | ICD-10-CM

## 2012-12-26 ENCOUNTER — Other Ambulatory Visit (HOSPITAL_COMMUNITY): Payer: Self-pay | Admitting: Hematology and Oncology

## 2012-12-26 ENCOUNTER — Ambulatory Visit (HOSPITAL_COMMUNITY)
Admission: RE | Admit: 2012-12-26 | Discharge: 2012-12-26 | Disposition: A | Discharge status: Home / Self Care | Payer: 59 | Source: Ambulatory Visit | Attending: Hematology and Oncology | Admitting: Hematology and Oncology

## 2012-12-26 DIAGNOSIS — N6310 Unspecified lump in the right breast, unspecified quadrant: Secondary | ICD-10-CM

## 2012-12-26 DIAGNOSIS — N649 Disorder of breast, unspecified: Secondary | ICD-10-CM | POA: Insufficient documentation

## 2012-12-26 MED ORDER — LIDOCAINE HCL (PF) 2 % IJ SOLN
INTRAMUSCULAR | Status: AC
  Filled 2012-12-26: qty 10

## 2012-12-27 ENCOUNTER — Other Ambulatory Visit (HOSPITAL_COMMUNITY): Payer: Self-pay | Admitting: Hematology and Oncology

## 2012-12-27 DIAGNOSIS — N6001 Solitary cyst of right breast: Secondary | ICD-10-CM

## 2013-02-13 ENCOUNTER — Other Ambulatory Visit: Payer: Self-pay | Admitting: Family Medicine

## 2013-03-06 ENCOUNTER — Encounter (INDEPENDENT_AMBULATORY_CARE_PROVIDER_SITE_OTHER): Payer: Self-pay | Admitting: Surgery

## 2013-03-12 ENCOUNTER — Other Ambulatory Visit (INDEPENDENT_AMBULATORY_CARE_PROVIDER_SITE_OTHER): Payer: Self-pay

## 2013-03-12 ENCOUNTER — Telehealth (INDEPENDENT_AMBULATORY_CARE_PROVIDER_SITE_OTHER): Payer: Self-pay

## 2013-03-12 NOTE — Telephone Encounter (Signed)
Lab slip for pt to have labs 2-3 business days prior to March appt with Dr Harlow Asa mailed to pts home address. LMOM for pt to call for this msg.

## 2013-03-20 ENCOUNTER — Ambulatory Visit (INDEPENDENT_AMBULATORY_CARE_PROVIDER_SITE_OTHER): Payer: 59 | Admitting: Family Medicine

## 2013-03-20 ENCOUNTER — Other Ambulatory Visit: Payer: Self-pay | Admitting: Family Medicine

## 2013-03-20 ENCOUNTER — Encounter: Payer: Self-pay | Admitting: Family Medicine

## 2013-03-20 VITALS — BP 122/80 | Ht 66.5 in | Wt 175.0 lb

## 2013-03-20 DIAGNOSIS — E213 Hyperparathyroidism, unspecified: Secondary | ICD-10-CM

## 2013-03-20 DIAGNOSIS — I619 Nontraumatic intracerebral hemorrhage, unspecified: Secondary | ICD-10-CM

## 2013-03-20 DIAGNOSIS — I10 Essential (primary) hypertension: Secondary | ICD-10-CM

## 2013-03-20 DIAGNOSIS — Z79899 Other long term (current) drug therapy: Secondary | ICD-10-CM

## 2013-03-20 DIAGNOSIS — E785 Hyperlipidemia, unspecified: Secondary | ICD-10-CM

## 2013-03-20 MED ORDER — PRAVASTATIN SODIUM 40 MG PO TABS: 40.0000 mg | ORAL_TABLET | Freq: Every day | ORAL | Status: DC

## 2013-03-20 MED ORDER — LABETALOL HCL 100 MG PO TABS: 100.0000 mg | ORAL_TABLET | Freq: Two times a day (BID) | ORAL | Status: DC

## 2013-03-20 NOTE — Progress Notes (Signed)
   Subjective:    Patient ID: Kristin [name], female    DOB: 08/21/1955, 58 y.o.   MRN: [id number]  HPIHyperlipidema.   Patient states no concerns at this time.  Exercising regularly  BP numbers overall pretyty good  140 syt mistly  With lower numbers  Missed a few doses on blood pressure r meds. Mostly compliance.  Compliant of hyperlipidemia medicines. No obvious side effects. Trying to walk some. Watch her diet.  Watching diet so so,  Now on vit d supplement  Improved energy since then  No signs of stroke, math skills still not as good as before the stroke occas stumbling for word no new TIA symptomatology.  Was diagnosed with hyperparathyroidism. Saw the specialist. Was placed on calcium supplement. They advised her this often helps avoid surgery. Due to see specialists again soon.  Review of Systems No headache or chest pain no back pain no abdominal pain no change in bowel habits no blood in stool ROS otherwise negative.    Objective:   Physical Exam Alert vitals reviewed. HEENT normal. Lungs clear. Heart regular in rhythm. Ankles without edema.       Assessment & Plan:  Impression 1 hypertension good control. #2 hyperlipidemia status uncertain discuss. #3 hyperparathyroidism discussed. #4 history of stroke no further symptoms. Plan maintain all medications. Diet exercise discussed. Followup with specialists is advised. Appropriate blood work further recommendations based results. Followup with [place] as scheduled. WSL

## 2013-03-20 NOTE — Progress Notes (Deleted)
   Subjective:    Patient ID: Kristin [name], female    DOB: April 19, 1955, 58 y.o.   MRN: [id number]  Hyperlipidemia This is a chronic problem. The current episode started more than 1 year ago. There are no compliance problems.     Patient states no concerns today.   Review of Systems     Objective:   Physical Exam        Assessment & Plan:

## 2013-04-02 ENCOUNTER — Telehealth (INDEPENDENT_AMBULATORY_CARE_PROVIDER_SITE_OTHER): Payer: Self-pay

## 2013-04-02 ENCOUNTER — Other Ambulatory Visit (INDEPENDENT_AMBULATORY_CARE_PROVIDER_SITE_OTHER): Payer: Self-pay

## 2013-04-02 NOTE — Telephone Encounter (Signed)
Spoke to patient and made her aware that labs need to be drawn prior to office visit next week.  Patient states she plans to go on 04/08/13 to lab in [place].

## 2013-04-02 NOTE — Telephone Encounter (Signed)
LMOM for pt to call. Pt is due for Vit d, calcium and PTH labs prior to next week appt. Need pt to let me know if she will do these in [place] so that orders can be sent.

## 2013-04-08 ENCOUNTER — Other Ambulatory Visit (INDEPENDENT_AMBULATORY_CARE_PROVIDER_SITE_OTHER): Payer: Self-pay | Admitting: Surgery

## 2013-04-08 LAB — LIPID PANEL
Cholesterol: 174 mg/dL (ref 0–200)
HDL: 64 mg/dL (ref >39)
LDL CALC: 95 mg/dL (ref 0–99)
Total CHOL/HDL Ratio: 2.7 Ratio
Triglycerides: 74 mg/dL (ref <150)
VLDL: 15 mg/dL (ref 0–40)

## 2013-04-08 LAB — HEPATIC FUNCTION PANEL
ALBUMIN: 4.3 g/dL (ref 3.5–5.2)
ALT: 19 U/L (ref 0–35)
AST: 20 U/L (ref 0–37)
Alkaline Phosphatase: 61 U/L (ref 39–117)
BILIRUBIN DIRECT: 0.1 mg/dL (ref 0.0–0.3)
Indirect Bilirubin: 0.5 mg/dL (ref 0.2–1.2)
Total Bilirubin: 0.6 mg/dL (ref 0.2–1.2)
Total Protein: 6.6 g/dL (ref 6.0–8.3)

## 2013-04-09 LAB — PTH, INTACT AND CALCIUM
CALCIUM: 10 mg/dL (ref 8.4–10.5)
PTH: 142.4 pg/mL — ABNORMAL HIGH (ref 14.0–72.0)

## 2013-04-09 LAB — VITAMIN D 25 HYDROXY (VIT D DEFICIENCY, FRACTURES): Vit D, 25-Hydroxy: 27 ng/mL — ABNORMAL LOW (ref 30–89)

## 2013-04-11 ENCOUNTER — Telehealth (INDEPENDENT_AMBULATORY_CARE_PROVIDER_SITE_OTHER): Payer: Self-pay

## 2013-04-11 ENCOUNTER — Ambulatory Visit (INDEPENDENT_AMBULATORY_CARE_PROVIDER_SITE_OTHER): Payer: 59 | Admitting: Surgery

## 2013-04-11 ENCOUNTER — Encounter (INDEPENDENT_AMBULATORY_CARE_PROVIDER_SITE_OTHER): Payer: Self-pay | Admitting: Surgery

## 2013-04-11 VITALS — BP 118/70 | HR 75 | Temp 98.5°F | Resp 14 | Ht 66.0 in | Wt 172.8 lb

## 2013-04-11 DIAGNOSIS — E213 Hyperparathyroidism, unspecified: Secondary | ICD-10-CM

## 2013-04-11 DIAGNOSIS — E559 Vitamin D deficiency, unspecified: Secondary | ICD-10-CM

## 2013-04-11 MED ORDER — VITAMIN D (ERGOCALCIFEROL) 1.25 MG (50000 UNIT) PO CAPS: 50000.0000 [IU] | ORAL_CAPSULE | ORAL | Status: DC

## 2013-04-11 NOTE — Telephone Encounter (Signed)
Lab orders per Dr Gala Lewandowsky request placed in epic for [facility][calcium with intact pth, vit D hydroxy 25. Lab slips given to pt with instruction to go to lab in 6 mo and also make f/u appt with Dr Harlow Asa in 6 mo after labs done.

## 2013-04-11 NOTE — Progress Notes (Signed)
General Surgery [facility]  Chief Complaint  Patient presents with  . Follow-up    suspected primary hyperparathyroidism    HISTORY: Patient is a 58 year old female with suspected primary hyperparathyroidism. She was last evaluated in September 2014. Nuclear medicine parathyroid scan had failed to reveal a parathyroid adenoma. At my request she underwent an ultrasound examination of the neck which showed bilateral small thyroid nodules but also failed to reveal evidence of a parathyroid adenoma.  Patient was placed on ergocalciferol. She returns now for evaluation. Laboratory studies were performed showing an improvement in her vitamin D level at 27, still low compared to the normal range of 30-89. Serum calcium level is normal at 10.0. Intact PTH level however it is moderately elevated at 142.4.  Patient notes that her energy level has improved dramatically after beginning to take vitamin D. She denies chronic fatigue. Patient did have a bone density scan in 2013 which showed osteopenia. She has not had a repeat study.  PERTINENT REVIEW OF SYSTEMS: Denies chronic fatigue. No history of nephrolithiasis.  EXAM: HEENT: normocephalic; pupils equal and reactive; sclerae clear; dentition good; mucous membranes moist NECK:  Mildly nodular thyroid exam without dominant or discrete mass; symmetric on extension; no palpable anterior or posterior cervical lymphadenopathy; no supraclavicular masses; no tenderness CHEST: clear to auscultation bilaterally without rales, rhonchi, or wheezes CARDIAC: regular rate and rhythm without significant murmur; peripheral pulses are full EXT:  non-tender without edema; no deformity NEURO: no gross focal deficits; no sign of tremor   IMPRESSION: #1 elevated intact PTH level, suspected primary hyperparathyroidism #2 mild vitamin D deficiency #3 bilateral thyroid nodules  PLAN: The patient and I discussed the above findings. This is a  borderline case and she and I discussed the options of surgical management versus continued medical management. Certainly you could make a case for neck exploration given her elevated PTH level and elevated urinary calcium excretion. However, her calcium level is at the lowest level in 2 years at 10.0. She has not had any significant complications. I would like to see a repeat bone density scan later this year for comparison to her study in 2013.  I am going to again prescribe ergocalciferol 50,000 international unit units weekly. I am going to ask her to return in 6 months with repeat intact PTH level and calcium level and 25 hydroxy vitamin D level obtained from [facility].  I would like Dr. Wolfgang Phoenix to arrange for a bone density scan to be performed this fall.  The patient will return in 6 months following the above studies to review the results and to again discuss the possibility of parathyroid surgery. Patient is in agreement with this plan.  Earnstine Regal, MD, [facility] Office: [phone]  Visit Diagnoses: 1. Hyperparathyroidism

## 2013-04-17 ENCOUNTER — Other Ambulatory Visit: Payer: Self-pay | Admitting: Family Medicine

## 2013-04-23 ENCOUNTER — Other Ambulatory Visit: Payer: Self-pay | Admitting: Family Medicine

## 2013-04-23 ENCOUNTER — Telehealth: Payer: Self-pay | Admitting: Family Medicine

## 2013-04-23 MED ORDER — PRAVASTATIN SODIUM 40 MG PO TABS: 40.0000 mg | ORAL_TABLET | Freq: Every day | ORAL | Status: DC

## 2013-04-23 NOTE — Telephone Encounter (Signed)
pravastatin (PRAVACHOL) 40 MG tablet  [facility]

## 2013-04-23 NOTE — Telephone Encounter (Signed)
Medication was sent to pharmacy. Left message on voicemail notifying patient.

## 2013-07-03 ENCOUNTER — Ambulatory Visit (HOSPITAL_COMMUNITY)
Admission: RE | Admit: 2013-07-03 | Discharge: 2013-07-03 | Disposition: A | Discharge status: Home / Self Care | Payer: 59 | Source: Ambulatory Visit | Attending: Hematology and Oncology | Admitting: Hematology and Oncology

## 2013-07-03 ENCOUNTER — Other Ambulatory Visit (HOSPITAL_COMMUNITY): Payer: Self-pay | Admitting: Hematology and Oncology

## 2013-07-03 DIAGNOSIS — N644 Mastodynia: Secondary | ICD-10-CM

## 2013-07-03 DIAGNOSIS — N63 Unspecified lump in unspecified breast: Secondary | ICD-10-CM | POA: Insufficient documentation

## 2013-10-16 LAB — VITAMIN D 25 HYDROXY (VIT D DEFICIENCY, FRACTURES): Vit D, 25-Hydroxy: 37.2 ng/mL (ref 30.0–100.0)

## 2013-10-17 ENCOUNTER — Other Ambulatory Visit (INDEPENDENT_AMBULATORY_CARE_PROVIDER_SITE_OTHER): Payer: Self-pay

## 2013-10-17 ENCOUNTER — Ambulatory Visit (INDEPENDENT_AMBULATORY_CARE_PROVIDER_SITE_OTHER): Payer: 59 | Admitting: Surgery

## 2013-10-17 DIAGNOSIS — E213 Hyperparathyroidism, unspecified: Secondary | ICD-10-CM

## 2013-10-23 ENCOUNTER — Telehealth: Payer: Self-pay | Admitting: Family Medicine

## 2013-10-23 DIAGNOSIS — M8580 Other specified disorders of bone density and structure, unspecified site: Secondary | ICD-10-CM

## 2013-10-23 NOTE — Telephone Encounter (Signed)
Patient states that Dr. Armandina Gemma told her that he would be in touch with our office to request that we set up a bone density test. She would like to know the status of this.

## 2013-10-23 NOTE — Telephone Encounter (Signed)
i sent letter to nurses desk today to sched, allso have pt sched six mo f u should have been in September for htn and lipid meds/eval

## 2013-10-24 NOTE — Telephone Encounter (Signed)
Pt will be out of town oct 19-23. Prefers afternoon appt

## 2013-10-24 NOTE — Telephone Encounter (Signed)
Order in for bone density. Pt can go any day. Prefers early am.

## 2013-10-25 NOTE — Telephone Encounter (Signed)
Test scheduled oct 15 th register 8:45. Pt notified.

## 2013-10-31 ENCOUNTER — Ambulatory Visit (HOSPITAL_COMMUNITY)
Admission: RE | Admit: 2013-10-31 | Discharge: 2013-10-31 | Disposition: A | Discharge status: Home / Self Care | Payer: Self-pay | Source: Ambulatory Visit | Attending: Family Medicine | Admitting: Family Medicine

## 2013-10-31 DIAGNOSIS — M8580 Other specified disorders of bone density and structure, unspecified site: Secondary | ICD-10-CM | POA: Insufficient documentation

## 2013-11-01 ENCOUNTER — Encounter: Payer: Self-pay | Admitting: Family Medicine

## 2013-11-01 ENCOUNTER — Ambulatory Visit (INDEPENDENT_AMBULATORY_CARE_PROVIDER_SITE_OTHER): Payer: PRIVATE HEALTH INSURANCE | Admitting: Family Medicine

## 2013-11-01 VITALS — BP 128/80 | Temp 98.3°F | Ht 66.5 in | Wt 168.0 lb

## 2013-11-01 DIAGNOSIS — I872 Venous insufficiency (chronic) (peripheral): Secondary | ICD-10-CM

## 2013-11-01 DIAGNOSIS — I8311 Varicose veins of right lower extremity with inflammation: Secondary | ICD-10-CM

## 2013-11-01 DIAGNOSIS — I8312 Varicose veins of left lower extremity with inflammation: Secondary | ICD-10-CM

## 2013-11-01 MED ORDER — TRIAMCINOLONE ACETONIDE 0.1 % EX CREA: 1.0000 "application " | TOPICAL_CREAM | Freq: Two times a day (BID) | CUTANEOUS | Status: DC

## 2013-11-01 MED ORDER — CEPHALEXIN 500 MG PO CAPS: 500.0000 mg | ORAL_CAPSULE | Freq: Four times a day (QID) | ORAL | Status: DC

## 2013-11-01 NOTE — Progress Notes (Signed)
   Subjective:    Patient ID: Kristin [name], female    DOB: 07-Dec-1955, 58 y.o.   MRN: [id number]  Rash The current episode started more than 1 month ago (since june). Pain location: legs. The rash is characterized by itchiness, redness, pain and burning. Past treatments include anti-itch cream, antibiotic cream and moisturizer. The treatment provided no relief.   Patient has long-standing history of varicose veins. Often notes swelling towards the evening time.  Rash is itchy irritated. Recently it has become painful. Review of Systems  Skin: Positive for rash.   No headache no chest pain no shortness of breath    Objective:   Physical Exam Alert vitals stable. Lungs clear. Heart regular in rhythm. Legs bilateral significant varicosities patchy scaly erythematous rash with some appearance of secondary infection. Trace to  edema bilateral.      Assessment & Plan:  Impression venous stasis dermatitis with secondary infection. Plan appropriate cream prescribed. Antibiotics prescribed. If persists may need diuretic to diminish chronic swelling. And/or other intervention for venous stasis. WSL

## 2013-11-03 DIAGNOSIS — I872 Venous insufficiency (chronic) (peripheral): Secondary | ICD-10-CM | POA: Insufficient documentation

## 2013-11-11 ENCOUNTER — Other Ambulatory Visit: Payer: Self-pay | Admitting: Family Medicine

## 2013-11-19 ENCOUNTER — Encounter: Payer: 59 | Admitting: Nurse Practitioner

## 2013-11-21 ENCOUNTER — Encounter: Payer: Self-pay | Admitting: Nurse Practitioner

## 2013-11-21 ENCOUNTER — Ambulatory Visit (INDEPENDENT_AMBULATORY_CARE_PROVIDER_SITE_OTHER): Payer: PRIVATE HEALTH INSURANCE | Admitting: Nurse Practitioner

## 2013-11-21 VITALS — BP 130/82 | Ht 66.5 in | Wt 169.8 lb

## 2013-11-21 DIAGNOSIS — I8312 Varicose veins of left lower extremity with inflammation: Secondary | ICD-10-CM

## 2013-11-21 DIAGNOSIS — N63 Unspecified lump in unspecified breast: Secondary | ICD-10-CM

## 2013-11-21 DIAGNOSIS — Z0000 Encounter for general adult medical examination without abnormal findings: Secondary | ICD-10-CM

## 2013-11-21 DIAGNOSIS — I872 Venous insufficiency (chronic) (peripheral): Secondary | ICD-10-CM

## 2013-11-21 MED ORDER — FLUOCINONIDE-E 0.05 % EX CREA: 1.0000 "application " | TOPICAL_CREAM | Freq: Two times a day (BID) | CUTANEOUS | Status: DC

## 2013-11-24 ENCOUNTER — Encounter: Payer: Self-pay | Admitting: Nurse Practitioner

## 2013-11-24 NOTE — Progress Notes (Signed)
   Subjective:    Patient ID: Kristin [name], female    DOB: 01/15/1956, 58 y.o.   MRN: [id number]  HPI presents for wellness exam. Married same partner. No vaginal bleeding. Gets some labs through oncologist. Regular vision and dental exams. Treated for venous stasis dermatitis on 10/16. Better but still having some rash and itching on left lower leg. Is due for repeat mammogram and [place] in December.    Review of Systems  Constitutional: Negative for fever, activity change, appetite change and fatigue.  HENT: Negative for dental problem, ear pain, sinus pressure and sore throat.   Respiratory: Negative for cough, chest tightness, shortness of breath and wheezing.   Cardiovascular: Negative for chest pain and leg swelling.       Varicose veins lower extremities bilat.  Gastrointestinal: Negative for nausea, vomiting, abdominal pain, diarrhea, constipation, blood in stool and abdominal distention.  Genitourinary: Negative for dysuria, urgency, frequency, vaginal bleeding, vaginal discharge, enuresis, difficulty urinating, genital sores and pelvic pain.  Skin: Positive for rash.       Objective:   Physical Exam  Constitutional: She is oriented to person, place, and time. She appears well-developed. No distress.  HENT:  Right Ear: External ear normal.  Left Ear: External ear normal.  Mouth/Throat: Oropharynx is clear and moist.  Neck: Normal range of motion. Neck supple. No tracheal deviation present. No thyromegaly present.  Cardiovascular: Normal rate, regular rhythm and normal heart sounds.  Exam reveals no gallop.   No murmur heard. Pulmonary/Chest: Effort normal and breath sounds normal.  Abdominal: Soft. She exhibits no distension. There is no tenderness.  Genitourinary: Vagina normal and uterus normal. No vaginal discharge found.  External GU: no rashes or lesions. Vagina: pale; no discharge. Bimanual exam: no  Tenderness or masses. Rectal exam: no masses; no stool for hemoccult.    Musculoskeletal: She exhibits no edema.  Lymphadenopathy:    She has no cervical adenopathy.  Neurological: She is alert and oriented to person, place, and time.  Skin: Skin is warm and dry. No rash noted.  Patch of erythematous papules with some excoriation left pretibial area.   Psychiatric: She has a normal mood and affect. Her behavior is normal.  Vitals reviewed. Breast exam: no dominant masses; axillae no adenopathy.        Assessment & Plan:  Routine general medical examination at a health care facility - Plan: Lipid panel, POC Hemoccult Bld/Stl (3-Cd Home Screen)  Venous stasis dermatitis of left lower extremity  Breast mass - Plan: MM Digital Diagnostic Bilat, US BREAST COMPLETE UNI RIGHT INC AXILLA  Switch to Lidex cream to rash up to 2 weeks.  Encouraged regular activity, healthy diet and daily vitamin D/calcium supplementation. Add lipid panel to next labs. Return in about 1 year (around 11/22/2014).

## 2013-11-25 ENCOUNTER — Other Ambulatory Visit (HOSPITAL_COMMUNITY): Payer: Self-pay

## 2013-11-25 DIAGNOSIS — C189 Malignant neoplasm of colon, unspecified: Secondary | ICD-10-CM

## 2013-11-30 ENCOUNTER — Other Ambulatory Visit: Payer: Self-pay | Admitting: *Deleted

## 2013-11-30 DIAGNOSIS — Z0000 Encounter for general adult medical examination without abnormal findings: Secondary | ICD-10-CM

## 2013-11-30 LAB — POC HEMOCCULT BLD/STL (HOME/3-CARD/SCREEN)
Card #1 Date: 10102015
Card #2 Date: 10112015
Card #2 Fecal Occult Blod, POC: NEGATIVE
Card #3 Date: 10122015
FECAL OCCULT BLD: NEGATIVE
Fecal Occult Blood, POC: NEGATIVE

## 2013-12-03 ENCOUNTER — Encounter (HOSPITAL_COMMUNITY): Payer: 59 | Attending: Hematology and Oncology

## 2013-12-03 DIAGNOSIS — C189 Malignant neoplasm of colon, unspecified: Secondary | ICD-10-CM | POA: Insufficient documentation

## 2013-12-03 DIAGNOSIS — Z85038 Personal history of other malignant neoplasm of large intestine: Secondary | ICD-10-CM

## 2013-12-03 LAB — COMPREHENSIVE METABOLIC PANEL
ALT: 20 U/L (ref 0–35)
AST: 25 U/L (ref 0–37)
Albumin: 3.9 g/dL (ref 3.5–5.2)
Alkaline Phosphatase: 69 U/L (ref 39–117)
Anion gap: 11 (ref 5–15)
BUN: 16 mg/dL (ref 6–23)
CALCIUM: 10.3 mg/dL (ref 8.4–10.5)
CO2: 25 meq/L (ref 19–32)
CREATININE: 0.81 mg/dL (ref 0.50–1.10)
Chloride: 103 mEq/L (ref 96–112)
GFR, EST NON AFRICAN AMERICAN: 78 mL/min — AB (ref >90)
GLUCOSE: 97 mg/dL (ref 70–99)
Potassium: 4.7 mEq/L (ref 3.7–5.3)
Sodium: 139 mEq/L (ref 137–147)
Total Bilirubin: 0.6 mg/dL (ref 0.3–1.2)
Total Protein: 7.3 g/dL (ref 6.0–8.3)

## 2013-12-03 LAB — CBC WITH DIFFERENTIAL/PLATELET
Basophils Absolute: 0 10*3/uL (ref 0.0–0.1)
Basophils Relative: 1 % (ref 0–1)
EOS PCT: 2 % (ref 0–5)
Eosinophils Absolute: 0.1 10*3/uL (ref 0.0–0.7)
HEMATOCRIT: 42.1 % (ref 36.0–46.0)
HEMOGLOBIN: 13.9 g/dL (ref 12.0–15.0)
LYMPHS ABS: 0.9 10*3/uL (ref 0.7–4.0)
LYMPHS PCT: 19 % (ref 12–46)
MCH: 30 pg (ref 26.0–34.0)
MCHC: 33 g/dL (ref 30.0–36.0)
MCV: 90.7 fL (ref 78.0–100.0)
MONO ABS: 0.4 10*3/uL (ref 0.1–1.0)
MONOS PCT: 8 % (ref 3–12)
NEUTROS ABS: 3.4 10*3/uL (ref 1.7–7.7)
Neutrophils Relative %: 70 % (ref 43–77)
Platelets: 202 10*3/uL (ref 150–400)
RBC: 4.64 MIL/uL (ref 3.87–5.11)
RDW: 14.2 % (ref 11.5–15.5)
WBC: 4.9 10*3/uL (ref 4.0–10.5)

## 2013-12-03 LAB — LIPID PANEL
Cholesterol: 204 mg/dL — ABNORMAL HIGH (ref 0–200)
HDL: 65 mg/dL (ref >39)
LDL CALC: 123 mg/dL — AB (ref 0–99)
Total CHOL/HDL Ratio: 3.1 Ratio
Triglycerides: 82 mg/dL (ref <150)
VLDL: 16 mg/dL (ref 0–40)

## 2013-12-03 LAB — CEA: CEA: 0.9 ng/mL (ref 0.0–5.0)

## 2013-12-03 NOTE — Progress Notes (Signed)
LABS FOR CEA,CBCD,CMP. PLUS LIPR ADDED LABS

## 2013-12-04 ENCOUNTER — Encounter (HOSPITAL_COMMUNITY): Payer: 59 | Attending: Hematology and Oncology

## 2013-12-04 ENCOUNTER — Encounter (HOSPITAL_COMMUNITY): Payer: Self-pay

## 2013-12-04 ENCOUNTER — Encounter (HOSPITAL_COMMUNITY): Payer: Self-pay | Attending: Hematology and Oncology

## 2013-12-04 VITALS — BP 161/76 | HR 65 | Temp 98.2°F | Resp 18 | Wt 170.0 lb

## 2013-12-04 DIAGNOSIS — R635 Abnormal weight gain: Secondary | ICD-10-CM

## 2013-12-04 DIAGNOSIS — C189 Malignant neoplasm of colon, unspecified: Secondary | ICD-10-CM

## 2013-12-04 DIAGNOSIS — N649 Disorder of breast, unspecified: Secondary | ICD-10-CM

## 2013-12-04 DIAGNOSIS — E213 Hyperparathyroidism, unspecified: Secondary | ICD-10-CM

## 2013-12-04 DIAGNOSIS — Z85038 Personal history of other malignant neoplasm of large intestine: Secondary | ICD-10-CM

## 2013-12-04 DIAGNOSIS — M8580 Other specified disorders of bone density and structure, unspecified site: Secondary | ICD-10-CM

## 2013-12-04 DIAGNOSIS — R209 Unspecified disturbances of skin sensation: Secondary | ICD-10-CM

## 2013-12-04 DIAGNOSIS — R5383 Other fatigue: Secondary | ICD-10-CM

## 2013-12-04 LAB — TSH: TSH: 3.23 u[IU]/mL (ref 0.350–4.500)

## 2013-12-04 NOTE — Progress Notes (Signed)
[facility]  OFFICE PROGRESS NOTE  Rubbie Battiest, MD [place] [address]  DIAGNOSIS: Adenocarcinoma of colon - Plan: CBC with Differential, Comprehensive metabolic panel, TSH  Osteopenia  Chief Complaint  Patient presents with  . Follow-up  . Colon cancer stage II  . osteopenia  . Fatigue   CURRENT THERAPY: Watchful expectation.  INTERVAL HISTORY: Kristin [name] 59 y.o. 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 Aviva Signs on 09/13/2006 with the finding of 28 benign lymph nodes. No LV I was seen and she was not given adjuvant chemotherapy. She is just being observed. Appetite is good with no nausea, vomiting, diarrhea, constipation, melena, hematochezia, cough, wheezing, skin rash, headache, or seizures. Patient is being treated for a parathyroid abnormality with calcium supplements. She is also felt more lethargic of late and she fell asleep at lunch one week ago. She is sleeping well at night. She does admit to weight gain plus peripheral paresthesias. Next colonoscopy scheduled for 2017.  MEDICAL HISTORY: Past Medical History  Diagnosis Date  . Lyme disease 2010  . Complication of anesthesia     "need childsized tube to go down my throat/dr in 2008" (05/14/2012)  . Hypertension 05/14/2012    "starting today" (05/14/2012)  . Adenocarcinoma of colon 2008  . Hyperlipidemia   . Anemia   . 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 Hyperparathyroidism 01/2012    INTERIM HISTORY: has Adenocarcinoma of colon; Lyme disease; TIA (transient ischemic attack); Stroke, hemorrhagic; HTN (hypertension); Hyperparathyroidism; Other and unspecified hyperlipidemia; Unspecified vitamin D deficiency; and Venous stasis dermatitis on her problem list.    ALLERGIES:  is allergic to keflex.  MEDICATIONS: has a current medication list which includes  the following prescription(s): fluocinonide-emollient, labetalol, vitamin d (ergocalciferol), and triamcinolone cream.  SURGICAL HISTORY:  Past Surgical History  Procedure Laterality Date  . Colon resection  2008  . Colonoscopy  10/12/2010    Procedure: COLONOSCOPY;  Surgeon: Jamesetta So;  Location: [facility];  Service: Gastroenterology;  Laterality: N/A;  . Colon surgery    . Wisdom tooth extraction  2000's    "2" (05/14/2012)  . Facial cosmetic surgery      eyelids    FAMILY HISTORY: family history includes Cancer in her maternal grandfather; Heart attack in her father; Hypertension in her mother.  SOCIAL HISTORY:  reports that she has never smoked. She has never used smokeless tobacco. She reports that she does not drink alcohol or use illicit drugs.  REVIEW OF SYSTEMS:  Other than that discussed above is noncontributory.  PHYSICAL EXAMINATION: ECOG PERFORMANCE STATUS: 1 - Symptomatic but completely ambulatory  Blood pressure 161/76, pulse 65, temperature 98.2 F (36.8 C), temperature source Oral, resp. rate 18, weight 170 lb (77.111 kg), SpO2 100 %.  GENERAL:alert, no distress and comfortable SKIN: skin color, texture, turgor are normal, no rashes or significant lesions EYES: PERLA; Conjunctiva are pink and non-injected, sclera clear. No lid lag. SINUSES: No redness or tenderness over maxillary or ethmoid sinuses OROPHARYNX:no exudate, no erythema on lips, buccal mucosa, or tongue. NECK: supple, thyroid normal size, non-tender, without nodularity. No masses CHEST: normal AP diameter with no breast masses. LYMPH:  no palpable lymphadenopathy in the cervical, axillary or inguinal LUNGS: clear to auscultation and percussion with normal breathing effort HEART: regular rate & rhythm and no murmurs. ABDOMEN:abdomen soft, non-tender  and normal bowel sounds. No hepatomegaly, ascites, or CVA tenderness. MUSCULOSKELETAL:no cyanosis of digits and no clubbing. Range of motion  normal.  NEURO: alert & oriented x 3 with fluent speech, no focal motor/sensory deficits. Decreased DTRs. Hung up reflexes.   LABORATORY DATA: Lab on 12/03/2013  Component Date Value Ref Range Status  . WBC 12/03/2013 4.9  4.0 - 10.5 K/uL Final  . RBC 12/03/2013 4.64  3.87 - 5.11 MIL/uL Final  . Hemoglobin 12/03/2013 13.9  12.0 - 15.0 g/dL Final  . HCT 12/03/2013 42.1  36.0 - 46.0 % Final  . MCV 12/03/2013 90.7  78.0 - 100.0 fL Final  . MCH 12/03/2013 30.0  26.0 - 34.0 pg Final  . MCHC 12/03/2013 33.0  30.0 - 36.0 g/dL Final  . RDW 12/03/2013 14.2  11.5 - 15.5 % Final  . Platelets 12/03/2013 202  150 - 400 K/uL Final  . Neutrophils Relative % 12/03/2013 70  43 - 77 % Final  . Neutro Abs 12/03/2013 3.4  1.7 - 7.7 K/uL Final  . Lymphocytes Relative 12/03/2013 19  12 - 46 % Final  . Lymphs Abs 12/03/2013 0.9  0.7 - 4.0 K/uL Final  . Monocytes Relative 12/03/2013 8  3 - 12 % Final  . Monocytes Absolute 12/03/2013 0.4  0.1 - 1.0 K/uL Final  . Eosinophils Relative 12/03/2013 2  0 - 5 % Final  . Eosinophils Absolute 12/03/2013 0.1  0.0 - 0.7 K/uL Final  . Basophils Relative 12/03/2013 1  0 - 1 % Final  . Basophils Absolute 12/03/2013 0.0  0.0 - 0.1 K/uL Final  . Sodium 12/03/2013 139  137 - 147 mEq/L Final  . Potassium 12/03/2013 4.7  3.7 - 5.3 mEq/L Final  . Chloride 12/03/2013 103  96 - 112 mEq/L Final  . CO2 12/03/2013 25  19 - 32 mEq/L Final  . Glucose, Bld 12/03/2013 97  70 - 99 mg/dL Final  . BUN 12/03/2013 16  6 - 23 mg/dL Final  . Creatinine, Ser 12/03/2013 0.81  0.50 - 1.10 mg/dL Final  . Calcium 12/03/2013 10.3  8.4 - 10.5 mg/dL Final  . Total Protein 12/03/2013 7.3  6.0 - 8.3 g/dL Final  . Albumin 12/03/2013 3.9  3.5 - 5.2 g/dL Final  . AST 12/03/2013 25  0 - 37 U/L Final  . ALT 12/03/2013 20  0 - 35 U/L Final  . Alkaline Phosphatase 12/03/2013 69  39 - 117 U/L Final  . Total Bilirubin 12/03/2013 0.6  0.3 - 1.2 mg/dL Final  . GFR calc non Af Amer 12/03/2013 78* >90 mL/min  Final  . GFR calc Af Amer 12/03/2013 >90  >90 mL/min Final   Comment: (NOTE) The eGFR has been calculated using the CKD EPI equation. This calculation has not been validated in all clinical situations. eGFR's persistently <90 mL/min signify possible Chronic Kidney Disease.   . Anion gap 12/03/2013 11  5 - 15 Final  . CEA 12/03/2013 0.9  0.0 - 5.0 ng/mL Final   Performed at [facility]  Orders Only on 11/30/2013  Component Date Value Ref Range Status  . Card #1 Date 11/30/2013 [id number]   Final  . Fecal Occult Blood, POC 11/30/2013 Negative   Final  . Card #2 Date 11/30/2013 [id number]   Final  . Card #2 Fecal Occult Blod, POC 11/30/2013 Negative   Final  . Card #3 Date 11/30/2013 [id number]   Final  . Card #3 Fecal Occult Blood, POC 11/30/2013 Negative  Final  Office Visit on 11/21/2013  Component Date Value Ref Range Status  . Cholesterol 12/03/2013 204* 0 - 200 mg/dL Final   Comment: ATP III Classification:       < 200        mg/dL        Desirable      200 - 239     mg/dL        Borderline High      >= 240        mg/dL        High     . Triglycerides 12/03/2013 82  <150 mg/dL Final  . HDL 12/03/2013 65  >39 mg/dL Final  . Total CHOL/HDL Ratio 12/03/2013 3.1   Final  . VLDL 12/03/2013 16  0 - 40 mg/dL Final  . LDL Cholesterol 12/03/2013 123* 0 - 99 mg/dL Final   Comment:   Total Cholesterol/HDL Ratio:CHD Risk                        Coronary Heart Disease Risk Table                                        Men       Women          1/2 Average Risk              3.4        3.3              Average Risk              5.0        4.4           2X Average Risk              9.6        7.1           3X Average Risk             23.4       11.0 Use the calculated Patient Ratio above and the CHD Risk table  to determine the patient's CHD Risk. ATP III Classification (LDL):       < 100        mg/dL         Optimal      100 - 129     mg/dL         Near or Above Optimal      130 -  159     mg/dL         Borderline High      160 - 189     mg/dL         High       > 190        mg/dL         Very High       PATHOLOGY:no new pathology.  Urinalysis    Component Value Date/Time   COLORURINE YELLOW 05/13/2012 2145   APPEARANCEUR CLEAR 05/13/2012 2145   LABSPEC 1.015 05/13/2012 2145   PHURINE 5.5 05/13/2012 2145   GLUCOSEU NEGATIVE 05/13/2012 2145   HGBUR NEGATIVE 05/13/2012 2145   BILIRUBINUR NEGATIVE 05/13/2012 2145   KETONESUR NEGATIVE 05/13/2012 2145   PROTEINUR NEGATIVE 05/13/2012 2145   UROBILINOGEN 0.2 05/13/2012 2145   NITRITE NEGATIVE 05/13/2012  2145   LEUKOCYTESUR NEGATIVE 05/13/2012 2145    RADIOGRAPHIC STUDIES: No results found.    Status: Final result       PACS Images     Show images for DG Bone Density     Study Result     EXAM: DUAL X-RAY ABSORPTIOMETRY (DXA) FOR BONE MINERAL DENSITY  IMPRESSION: Ordering Physician: Dr. Mikey Kirschner,  Your patient Kristin [name] completed a BMD test on 10/31/2013 using the [place] (software version: 14.10) manufactured by [employer]. The following summarizes the results of our evaluation. PATIENT BIOGRAPHICAL: Name: Kristin [name], Kristin [name] Patient ID: [id number] Birth Date: 03/14/1955 Height: 65.0 in. Gender: Female Exam Date: 10/31/2013 Weight: 72.0 lbs. Indications: Caucasian, Follow up Osteopenia, Height Loss, History of Fracture (Adult), Hyperparathyroid, Low Calcium Intake, Post Menopausal, Vitamin D Deficiency, Secondary Osteoporosis Fractures: Wrist Treatments: Vitamin D DENSITOMETRY RESULTS: Site Region Measured Date Measured Age WHO Classification Young Adult T-score BMD %Change vs. Previous Significant Change (*) AP Spine L1-L4 (L3) 10/31/2013 59.4 Osteopenia -1.4 1.001 g/cm2 -3.5% Yes AP Spine L1-L4 (L3) 12/01/2011 57.4 Osteopenia -1.1 1.037 g/cm2 - -  DualFemur Neck Right 10/31/2013 59.4 Osteopenia -1.4 0.839  g/cm2 8.8% Yes DualFemur Neck Right 12/01/2011 57.4 Osteopenia -1.9 0.771 g/cm2 - -  Left Forearm Radius 33% 10/31/2013 59.4 Normal -0.4 0.683 g/cm2 - - ASSESSMENT: BMD as determined from Femur Neck Right is 0.839 g/cm2 with a T-Score of -1.4. This patient is considered osteopenic according to [facility]) criteria. Compared with the prior study on 12/01/2011, the BMD of the lumbar spine shows a statistically significant decrease. Compared with the prior study on 12/01/2011, the BMD of the right femoral neck shows a statistically significant increase. (L-3 was excluded due to advanced degenerative changes.)  World Health Organization [facility]) criteria for post-menopausal, Caucasian Women: Normal: T-score at or above -1 SD Osteopenia: T-score between -1 and -2.5 SD Osteoporosis: T-score at or below -2.5 SD  Dear Dr. Mikey Kirschner,  Your patient Kristin [name] completed a FRAX assessment on 10/31/2013 using the [place] (analysis version: 14.10) manufactured by [employer]. The following summarizes the results of our evaluation.  PATIENT BIOGRAPHICAL: Name: Kristin [name], Kristin [name] Patient ID: [id number] Birth Date: 11-10-1955 Height: 65.0 in. Gender: Female [age] Weight: 72.0 lbs. Ethnicity: White Exam Date: 10/31/2013  FRAX* RESULTS: (version: 3.5) 10-year Probability of Fracture1 Major Osteoporotic Fracture2 Hip Fracture 9.3% 1.0% Population: [place] (Caucasian) Risk Factors: History of Fracture (Adult), Secondary Osteoporosis  Based on Femur (Right) Neck BMD  1 -The 10-year probability of fracture may be lower than reported if the patient has received treatment. 2 -Major Osteoporotic Fracture: Clinical Spine, Forearm, Hip or Shoulder  *FRAX is a Materials engineer of the [employer] for Metabolic Bone Disease, a [place] (WHO) [facility].  ASSESSMENT: The probability of a major osteoporotic fracture is 9.3% within the next ten years. The probability of a hip fracture is 1.0% within the next ten years.  RECOMMENDATIONS:  All treatment decisions require clinical judgement and consideration of the individual patient factors, including patient preferences, comorbidities, previous drug use, risk factors not captured in the FRAX model (e.g., frailty, falls, vitamin D deficiency, increased bone turnover, interval significant decline in bone density) and possible under- or over-estimation of fracture risk by FRAX.  In addition, the NOF Guide recommends that FDA -approved medical therapies be considered in postmenopausal women and men age >=50 years with a: *Hip or  verterbral (clinical or morphometric) fracture *T-score of <+-2.5 at the spine or hip *Ten-year fracture probability by FRAX of >=3% for the hip fracture or >20% for major osteoportotic fracture.  FOLLOW-UP:  People with diagnosed cases of osteoporosis or osteopenia should be regularly tested for bone mineral density. For patients eligible for Medicare, routine testing is allowed once every 2 years. Testing frequency can be increased for patients who have rapidly progressing disease, or for those who are receiving medical therapy to restore bone mass.  Based on these results, a follow-up exam is recommended in October 2017. Sincerely,  [facility]   Electronically Signed  By: Lowella Grip M.D.  On: 10/31/2013     ASSESSMENT:  1.Stage IIA (T3, N0, M0) mildly well-differentiated adenocarcinoma of the cecum status post surgical resection by Dr. Aviva Signs on 09/13/2006 with the finding of 28 benign lymph nodes. No LV I was seen and she was not given adjuvant chemotherapy. She is just being observed. She has no evidence recurrence. She had colonoscopy in September 2012 and was  negative for malignancy: Next colonoscopy due in 5 years. Her recent CEA level is within normal range.  2. Hypercalcemia with hyperparathyroidism : Followed by DR. Gerkin 3. Bilateral breast cystic lesions of the breast: 4. Lethargy, weight gain, and peripheral paresthesias possibly due to hypothyroidism.   PLAN:  #1. TSH will be done today and if abnormal, her family physician and endocrinologist will be notified. #2. Follow-up in one year with CBC, chem profile, CEA.   All questions were answered. The patient knows to call the clinic with any problems, questions or concerns. We can certainly see the patient much sooner if necessary.   I spent 25 minutes counseling the patient face to face. The total time spent in the appointment was 30 minutes.    Doroteo Bradford, MD 12/04/2013 2:15 PM  DISCLAIMER:  This note was dictated with voice recognition software.  Similar sounding words can inadvertently be transcribed inaccurately and may not be corrected upon review.

## 2013-12-04 NOTE — Patient Instructions (Signed)
..  [facility] Discharge Instructions  RECOMMENDATIONS MADE BY THE CONSULTANT AND ANY TEST RESULTS WILL BE SENT TO YOUR REFERRING PHYSICIAN.  EXAM FINDINGS BY THE PHYSICIAN TODAY AND SIGNS OR SYMPTOMS TO REPORT TO CLINIC OR PRIMARY PHYSICIAN:  Exam and findings as discussed by Barnet Glasgow.   We will check your TSH today   INSTRUCTIONS/FOLLOW-UP: 1 year  Thank you for choosing [facility] to provide your oncology and hematology care.  To afford each patient quality time with our providers, please arrive at least 15 minutes before your scheduled appointment time.  With your help, our goal is to use those 15 minutes to complete the necessary work-up to ensure our physicians have the information they need to help with your evaluation and healthcare recommendations.    Effective January 1st, 2014, we ask that you re-schedule your appointment with our physicians should you arrive 10 or more minutes late for your appointment.  We strive to give you quality time with our providers, and arriving late affects you and other patients whose appointments are after yours.    Again, thank you for choosing [facility].  Our hope is that these requests will decrease the amount of time that you wait before being seen by our physicians.       _____________________________________________________________  Should you have questions after your visit to [facility], please contact our office at [phone] between the hours of 8:30 a.m. and 4:30 p.m.  Voicemails left after 4:30 p.m. will not be returned until the following business day.  For prescription refill requests, have your pharmacy contact our office with your prescription refill request.    _______________________________________________________________  We hope that we have given you very good care.  You may receive a patient satisfaction survey in the mail, please complete it and return it as  soon as possible.  We value your feedback!  _______________________________________________________________  Have you asked about our STAR program?  STAR stands for Survivorship Training and Rehabilitation, and this is a nationally recognized cancer care program that focuses on survivorship and rehabilitation.  Cancer and cancer treatments may cause problems, such as, pain, making you feel tired and keeping you from doing the things that you need or want to do. Cancer rehabilitation can help. Our goal is to reduce these troubling effects and help you have the best quality of life possible.  You may receive a survey from a nurse that asks questions about your current state of health.  Based on the survey results, all eligible patients will be referred to the [facility] program for an evaluation so we can better serve you!  A frequently asked questions sheet is available upon request.

## 2013-12-04 NOTE — Progress Notes (Signed)
Labs for tsh

## 2013-12-05 ENCOUNTER — Encounter: Payer: Self-pay | Admitting: Nurse Practitioner

## 2014-01-07 ENCOUNTER — Ambulatory Visit (HOSPITAL_COMMUNITY)
Admission: RE | Admit: 2014-01-07 | Discharge: 2014-01-07 | Disposition: A | Discharge status: Home / Self Care | Payer: 59 | Source: Ambulatory Visit | Attending: Nurse Practitioner | Admitting: Nurse Practitioner

## 2014-01-07 DIAGNOSIS — N63 Unspecified lump in breast: Secondary | ICD-10-CM | POA: Insufficient documentation

## 2014-01-07 DIAGNOSIS — Z1231 Encounter for screening mammogram for malignant neoplasm of breast: Secondary | ICD-10-CM | POA: Insufficient documentation

## 2014-01-07 DIAGNOSIS — R928 Other abnormal and inconclusive findings on diagnostic imaging of breast: Secondary | ICD-10-CM | POA: Diagnosis present

## 2014-04-19 LAB — PTH, INTACT AND CALCIUM
CALCIUM: 10.3 mg/dL — AB (ref 8.7–10.2)
PTH: 51 pg/mL (ref 15–65)

## 2014-04-19 LAB — VITAMIN D 25 HYDROXY (VIT D DEFICIENCY, FRACTURES): Vit D, 25-Hydroxy: 28.8 ng/mL — ABNORMAL LOW (ref 30.0–100.0)

## 2014-06-27 ENCOUNTER — Other Ambulatory Visit: Payer: Self-pay | Admitting: Family Medicine

## 2014-07-25 ENCOUNTER — Other Ambulatory Visit: Payer: Self-pay | Admitting: Family Medicine

## 2014-08-05 ENCOUNTER — Encounter: Payer: Self-pay | Admitting: Nurse Practitioner

## 2014-08-05 ENCOUNTER — Ambulatory Visit (INDEPENDENT_AMBULATORY_CARE_PROVIDER_SITE_OTHER): Payer: PRIVATE HEALTH INSURANCE | Admitting: Nurse Practitioner

## 2014-08-05 VITALS — BP 130/84 | Ht 66.5 in | Wt 172.0 lb

## 2014-08-05 DIAGNOSIS — F430 Acute stress reaction: Secondary | ICD-10-CM

## 2014-08-05 DIAGNOSIS — I10 Essential (primary) hypertension: Secondary | ICD-10-CM | POA: Diagnosis not present

## 2014-08-05 DIAGNOSIS — F411 Generalized anxiety disorder: Secondary | ICD-10-CM

## 2014-08-05 DIAGNOSIS — F419 Anxiety disorder, unspecified: Secondary | ICD-10-CM | POA: Diagnosis not present

## 2014-08-05 MED ORDER — LABETALOL HCL 100 MG PO TABS
100.0000 mg | ORAL_TABLET | Freq: Two times a day (BID) | ORAL | Status: DC
Start: 2014-08-05 — End: 2015-03-09

## 2014-08-05 MED ORDER — CITALOPRAM HYDROBROMIDE 20 MG PO TABS: ORAL_TABLET | ORAL | Status: DC

## 2014-08-06 ENCOUNTER — Encounter: Payer: Self-pay | Admitting: Nurse Practitioner

## 2014-08-06 NOTE — Progress Notes (Signed)
Subjective:  Presents for routine follow-up of her blood pressure. Vitamin D level and parathyroidism is being followed by her endocrinologist. Has had brief nosebleeds daily for about a month, none for the past few days. Can last up to 3 minutes. No excessive bruising or bleeding. Occasional sinus pressure. Mild head congestion. No ear pain sore throat or fever. Under extreme stress at this time, having anxiety symptoms and insomnia. Has personal issues at home with her sister, no previous history of significant anxiety. History of TIA and stroke, symptoms have been stable, according to patient scans have been normal. Has not seen neurology lately. Had "an episode" on 6/11 where she felt lightheaded, which improved after she cooled off and calmed down. None since. No chest pain/ischemic type pain or shortness of breath or significant edema.  Objective:   BP 130/84 mmHg  Ht 5' 6.5" (1.689 m)  Wt 172 lb (78.019 kg)  BMI 27.35 kg/m2 NAD. Alert, oriented. Moderately anxious affect. Crying a few times during office visit. Lungs clear. Heart regular rate rhythm. Lower extremities no edema.  Assessment:  Problem List Items Addressed This Visit      Cardiovascular and Mediastinum   HTN (hypertension) - Primary   Relevant Medications   labetalol (NORMODYNE) 100 MG tablet    Other Visit Diagnoses    Anxiety as acute reaction to exceptional stress        Relevant Medications    citalopram (CELEXA) 20 MG tablet      Plan:  Meds ordered this encounter  Medications  . citalopram (CELEXA) 20 MG tablet    Sig: 1/2 tab po qhs x 6 d then one po qhs    Dispense:  30 tablet    Refill:  0    Order Specific Question:  Supervising Provider    Answer:  Mikey Kirschner [[id number]]  . labetalol (NORMODYNE) 100 MG tablet    Sig: Take 1 tablet (100 mg total) by mouth 2 (two) times daily.    Dispense:  60 tablet    Refill:  5    Order Specific Question:  Supervising Provider    Answer:  Mikey Kirschner  [[id number]]   Discussed importance of stress reduction including regular exercise. Patient has already taken measures to help reduce her stress. Start Celexa as directed. DC med and call if any problems. Seek help immediately if any TIA symptoms. Also if episodes recur recommend follow-up with neurology. Return in about 1 month (around 09/05/2014) for recheck.

## 2014-09-01 ENCOUNTER — Other Ambulatory Visit: Payer: Self-pay | Admitting: Nurse Practitioner

## 2014-09-08 ENCOUNTER — Encounter: Payer: Self-pay | Admitting: Nurse Practitioner

## 2014-09-08 ENCOUNTER — Ambulatory Visit (INDEPENDENT_AMBULATORY_CARE_PROVIDER_SITE_OTHER): Payer: PRIVATE HEALTH INSURANCE | Admitting: Nurse Practitioner

## 2014-09-08 VITALS — BP 138/96 | Ht 66.5 in | Wt 171.0 lb

## 2014-09-08 DIAGNOSIS — F419 Anxiety disorder, unspecified: Secondary | ICD-10-CM

## 2014-09-08 DIAGNOSIS — F411 Generalized anxiety disorder: Secondary | ICD-10-CM | POA: Insufficient documentation

## 2014-09-08 DIAGNOSIS — I8312 Varicose veins of left lower extremity with inflammation: Secondary | ICD-10-CM | POA: Diagnosis not present

## 2014-09-08 DIAGNOSIS — I10 Essential (primary) hypertension: Secondary | ICD-10-CM

## 2014-09-08 DIAGNOSIS — F430 Acute stress reaction: Secondary | ICD-10-CM

## 2014-09-08 DIAGNOSIS — I8311 Varicose veins of right lower extremity with inflammation: Secondary | ICD-10-CM | POA: Diagnosis not present

## 2014-09-08 DIAGNOSIS — I872 Venous insufficiency (chronic) (peripheral): Secondary | ICD-10-CM

## 2014-09-08 MED ORDER — CITALOPRAM HYDROBROMIDE 20 MG PO TABS
ORAL_TABLET | ORAL | Status: DC
Start: 2014-09-08 — End: 2015-03-09

## 2014-09-08 MED ORDER — BETAMETHASONE DIPROPIONATE 0.05 % EX CREA: TOPICAL_CREAM | Freq: Two times a day (BID) | CUTANEOUS | Status: DC

## 2014-09-08 NOTE — Progress Notes (Addendum)
Subjective:  Presents for routine follow-up. Doing well on Celexa. Also her sister has moved out, stress level has greatly improved over the past 2 weeks. Sleep has improved. BP remains in the range of 130s/90s. Also a rash on lower legs has improved somewhat triamcinolone does not quite resolved. Very pruritic. Note the patient has had no further nosebleeds or neurologic symptoms since last visit. Symptoms resolved when her stress decreased.  Objective:   BP 138/96 mmHg  Ht 5' 6.5" (1.689 m)  Wt 171 lb (77.565 kg)  BMI 27.19 kg/m2 NAD. Alert, oriented. Lungs clear. Heart regular rate rhythm. Calmer more cheerful affect since previous visit. Also appears to be well rested. Faint slightly raised dry pink areas on the lower legs, significant varicosities noted.  Assessment:  Problem List Items Addressed This Visit      Cardiovascular and Mediastinum   HTN (hypertension) - Primary     Musculoskeletal and Integument   Venous stasis dermatitis     Other   Anxiety as acute reaction to exceptional stress   Relevant Medications   citalopram (CELEXA) 20 MG tablet     Plan:  Meds ordered this encounter  Medications  . Cholecalciferol (VITAMIN D) 2000 UNITS CAPS    Sig: Take by mouth.  . citalopram (CELEXA) 20 MG tablet    Sig: 1 TABLET BY MOUTH EVERY NIGHT AT BEDTIME    Dispense:  30 tablet    Refill:  5    Order Specific Question:  Supervising Provider    Answer:  Mikey Kirschner [[id number]]  . betamethasone dipropionate (DIPROLENE) 0.05 % cream    Sig: Apply topically 2 (two) times daily. Up to 2 weeks at a time    Dispense:  60 g    Refill:  0    Order Specific Question:  Supervising Provider    Answer:  Mikey Kirschner [[id number]]  . Vitamin D, Ergocalciferol, (DRISDOL) 50000 UNITS CAPS capsule    Sig: TK 1 C PO ONCE A WEEK    Refill:  1   Continue Celexa as directed. Stop triamcinolone. Switch to betamethasone cream. Call back in 2 weeks if rash persists, recommend dermatology  referral at that time. Patient to schedule appointment for preventive health physical. Return in about 6 months (around 03/11/2015) for recheck.

## 2014-12-02 NOTE — Progress Notes (Signed)
Mickie Hillier, MD [place] [address]  Adenocarcinoma of colon [facility]) - Plan: CBC with Differential, Comprehensive metabolic panel, CBC with Differential, Comprehensive metabolic panel  CURRENT THERAPY: Surveillance per NCCN guidelines  INTERVAL HISTORY: Kristin [name] 59 y.o. 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 Aviva Signs on 09/13/2006 with the finding of 28 benign lymph nodes. No LV I was seen and she was not given adjuvant chemotherapy.      Adenocarcinoma of colon ([place])   09/13/2006 Pathology Results Right segmental resection by Dr. Arnoldo Morale   09/13/2006 Definitive Surgery COLON, RIGHT, SEGMENTAL RESECTION: COLONIC ADENOCARCINOMA FOCALLY INVADING THROUGH MUSCULARIS PROPRIA, MARGINS NOT INVOLVED. TWENTY-EIGHT BENIGN LYMPH NODES (0/28).    I personally reviewed and went over laboratory results with the patient.  The results are noted within this dictation.  Labs will be updated today.  I personally reviewed and went over radiographic studies with the patient.  The results are noted within this dictation.  Mammogram last year on 01/07/2014 was BIRADS 3.  She will be due in the next 6 weeks or so for her annual mammogram.  She will be due for colonoscopy by Dr. Arnoldo Morale in Sept 2017, according to his 2012 colonoscopy report.  She had a wonderful year.  She denies any issues.  We discussed her family history regarding malignancy. She denies any family history of CRC, GI/GU, and gynecologic malignancies.  She had a MGF with prostate cancer, a cousin with "bone" cancer, and another family member with a "blood" cancer.   Past Medical History  Diagnosis Date  . Lyme disease 2010  . Complication of anesthesia     "need childsized tube to go down my throat/dr in 2008" (05/14/2012)  . Hypertension 05/14/2012    "starting today" (05/14/2012)  .  Adenocarcinoma of colon ([place]) 2008  . Hyperlipidemia   . Anemia   . 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 Hyperparathyroidism ([place]) 01/2012    has Adenocarcinoma of colon ([place]); Lyme disease; TIA (transient ischemic attack); Stroke, hemorrhagic ([place]); HTN (hypertension); Hyperparathyroidism ([place]); Other and unspecified hyperlipidemia; Unspecified vitamin D deficiency; Venous stasis dermatitis; and Anxiety as acute reaction to exceptional stress on her problem list.     is allergic to keflex.  Current Outpatient Prescriptions on File Prior to Visit  Medication Sig Dispense Refill  . betamethasone dipropionate (DIPROLENE) 0.05 % cream Apply topically 2 (two) times daily. Up to 2 weeks at a time 60 g 0  . Cholecalciferol (VITAMIN D) 2000 UNITS CAPS Take 1 capsule by mouth daily.     . citalopram (CELEXA) 20 MG tablet 1 TABLET BY MOUTH EVERY NIGHT AT BEDTIME 30 tablet 5  . labetalol (NORMODYNE) 100 MG tablet Take 1 tablet (100 mg total) by mouth 2 (two) times daily. 60 tablet 5   No current facility-administered medications on file prior to visit.    Past Surgical History  Procedure Laterality Date  . Colon resection  2008  . Colonoscopy  10/12/2010    Procedure: COLONOSCOPY;  Surgeon: Jamesetta So;  Location: [facility];  Service: Gastroenterology;  Laterality: N/A;  . Colon surgery    . Wisdom tooth extraction  2000's    "2" (05/14/2012)  . Facial cosmetic surgery      eyelids    Denies any headaches, dizziness, double vision, fevers, chills, night sweats, nausea, vomiting, diarrhea, constipation, chest pain,  heart palpitations, shortness of breath, blood in stool, black tarry stool, urinary pain, urinary burning, urinary frequency, hematuria.   PHYSICAL EXAMINATION  ECOG PERFORMANCE STATUS: 0 - Asymptomatic  Filed Vitals:   12/04/14 0941  BP: 118/81  Pulse: 67  Temp: 98.2 F (36.8 C)  Resp: 15    GENERAL:alert, healthy, no distress, well nourished, well developed, comfortable,  cooperative, smiling and unaccompanied SKIN: skin color, texture, turgor are normal, no rashes or significant lesions HEAD: Normocephalic, No masses, lesions, tenderness or abnormalities EYES: normal, PERRLA, EOMI, Conjunctiva are pink and non-injected EARS: External ears normal OROPHARYNX:lips, buccal mucosa, and tongue normal and mucous membranes are moist  NECK: supple, no adenopathy, thyroid normal size, non-tender, without nodularity, no stridor, non-tender, trachea midline LYMPH:  no palpable lymphadenopathy, no hepatosplenomegaly BREAST:not examined LUNGS: clear to auscultation and percussion HEART: regular rate & rhythm, no murmurs, no gallops, S1 normal and S2 normal ABDOMEN:abdomen soft, non-tender, normal bowel sounds and no masses or organomegaly BACK: Back symmetric, no curvature., No CVA tenderness EXTREMITIES:less then 2 second capillary refill, no joint deformities, effusion, or inflammation, no edema, no skin discoloration, no clubbing, no cyanosis  NEURO: alert & oriented x 3 with fluent speech, no focal motor/sensory deficits, gait normal   LABORATORY DATA: CBC    Component Value Date/Time   WBC 4.9 12/03/2013 1005   RBC 4.64 12/03/2013 1005   HGB 13.9 12/03/2013 1005   HCT 42.1 12/03/2013 1005   PLT 202 12/03/2013 1005   MCV 90.7 12/03/2013 1005   MCH 30.0 12/03/2013 1005   MCHC 33.0 12/03/2013 1005   RDW 14.2 12/03/2013 1005   LYMPHSABS 0.9 12/03/2013 1005   MONOABS 0.4 12/03/2013 1005   EOSABS 0.1 12/03/2013 1005   BASOSABS 0.0 12/03/2013 1005      Chemistry      Component Value Date/Time   NA 139 12/03/2013 1005   K 4.7 12/03/2013 1005   CL 103 12/03/2013 1005   CO2 25 12/03/2013 1005   BUN 16 12/03/2013 1005   CREATININE 0.81 12/03/2013 1005      Component Value Date/Time   CALCIUM 10.3* 04/18/2014 1155   ALKPHOS 69 12/03/2013 1005   AST 25 12/03/2013 1005   ALT 20 12/03/2013 1005   BILITOT 0.6 12/03/2013 1005        PENDING  LABS:   RADIOGRAPHIC STUDIES:  No results found.   PATHOLOGY:    ASSESSMENT AND PLAN:  Adenocarcinoma of colon ([facility]) Stage IIA (T3, N0, M0) mildly well-differentiated adenocarcinoma of the cecum status post surgical resection by Dr. Aviva Signs on 09/13/2006 with the finding of 28 benign lymph nodes. No LV I was seen and she was not given adjuvant chemotherapy.    Oncology history developed.  She has completed active surveillance per NCCN guidelines.  Labs today: CBC diff, CMET  Labs in 12 months: CBC diff, CMET  Mammogram is due in Dec 2016.  Colonoscopy is due in Sept 2017 by Dr. Arnoldo Morale.  Scheduler has contacted Dr. Adline Mango office for appointment.  Return in 12 months for follow-up.  In 2 years, she will be 10 years out from definitive surgical resection of Stage IIA CRC by Dr. Arnoldo Morale.  I will message our Genetic Counselor to see if there is any role for counseling/testing.  After 10 years of follow-up in 2018, we may consider releasing the patient from the [facility] with regular follow-up with primary care provider, Dr. Wolfgang Phoenix, as long as the patient is agreeable with GI  follow-up as directed for colonoscopies.  Current plan is to see the patient back in 12 and 24 months for annual follow-up/surveillance and in 2018, we will re-evaluate continued oncology follow-up.  Given her young age in 64 years (59 years old) continuation of annual oncology follow-up may not be unreasonable either.  Case discussed with Roma Kayser.  Patient does not have a strong family history of malignancy.  I have called pathology requesting MSI/IHC.    THERAPY PLAN:  NCCN guidelines for surveillance for T3/T4, N0-2, M0 colorectal cancer are:   A. H+P every 3-6 months x 2 years and then every 6 months for a total of 5 years   B. CEA every 3 months x 2 years and then every 6 months for a total of 5 years   C. CT abd/pelvis annually for up to 5 years   D. Colonoscopy in 1 year except if  no preoperative colonoscopy due to obstructing lesion, colonoscopy in 3-6 months.    1. If advanced adenoma, repeat in 1 year   2. If no advanced adenoma, repeat in 3 years, then every 5 years  E. PET scan not routinely recommended.   All questions were answered. The patient knows to call the clinic with any problems, questions or concerns. We can certainly see the patient much sooner if necessary.  Patient and plan discussed with Dr. Ancil Linsey and she is in agreement with the aforementioned.   This note is electronically signed by: Doy Mince 12/04/2014 10:50 AM

## 2014-12-02 NOTE — Assessment & Plan Note (Addendum)
Stage IIA (T3, N0, M0) mildly well-differentiated adenocarcinoma of the cecum status post surgical resection by Dr. Aviva Signs on 09/13/2006 with the finding of 28 benign lymph nodes. No LV I was seen and she was not given adjuvant chemotherapy.    Oncology history developed.  She has completed active surveillance per NCCN guidelines.  Labs today: CBC diff, CMET  Labs in 12 months: CBC diff, CMET  Mammogram is due in Dec 2016.  Colonoscopy is due in Sept 2017 by Dr. Arnoldo Morale.  Scheduler has contacted Dr. Adline Mango office for appointment.  Return in 12 months for follow-up.  In 2 years, she will be 10 years out from definitive surgical resection of Stage IIA CRC by Dr. Arnoldo Morale.  I will message our Genetic Counselor to see if there is any role for counseling/testing.  After 10 years of follow-up in 2018, we may consider releasing the patient from the [facility] with regular follow-up with primary care provider, Dr. Wolfgang Phoenix, as long as the patient is agreeable with GI follow-up as directed for colonoscopies.  Current plan is to see the patient back in 12 and 24 months for annual follow-up/surveillance and in 2018, we will re-evaluate continued oncology follow-up.  Given her young age in 59 years (59 years old) continuation of annual oncology follow-up may not be unreasonable either.  Case discussed with Roma Kayser.  Patient does not have a strong family history of malignancy.  I have called pathology requesting MSI/IHC.

## 2014-12-04 ENCOUNTER — Ambulatory Visit (HOSPITAL_COMMUNITY): Payer: Self-pay | Admitting: Hematology & Oncology

## 2014-12-04 ENCOUNTER — Encounter (HOSPITAL_COMMUNITY): Payer: 59 | Attending: Oncology | Admitting: Oncology

## 2014-12-04 ENCOUNTER — Encounter (HOSPITAL_COMMUNITY): Payer: Self-pay | Admitting: Oncology

## 2014-12-04 VITALS — BP 118/81 | HR 67 | Temp 98.2°F | Resp 15 | Wt 167.4 lb

## 2014-12-04 DIAGNOSIS — C189 Malignant neoplasm of colon, unspecified: Secondary | ICD-10-CM | POA: Diagnosis not present

## 2014-12-04 LAB — CBC WITH DIFFERENTIAL/PLATELET
Basophils Absolute: 0 10*3/uL (ref 0.0–0.1)
Basophils Relative: 0 %
EOS PCT: 2 %
Eosinophils Absolute: 0.1 10*3/uL (ref 0.0–0.7)
HEMATOCRIT: 41.2 % (ref 36.0–46.0)
Hemoglobin: 13.3 g/dL (ref 12.0–15.0)
LYMPHS ABS: 1.2 10*3/uL (ref 0.7–4.0)
LYMPHS PCT: 21 %
MCH: 30 pg (ref 26.0–34.0)
MCHC: 32.3 g/dL (ref 30.0–36.0)
MCV: 92.8 fL (ref 78.0–100.0)
MONO ABS: 0.5 10*3/uL (ref 0.1–1.0)
MONOS PCT: 8 %
NEUTROS ABS: 4 10*3/uL (ref 1.7–7.7)
Neutrophils Relative %: 69 %
PLATELETS: 218 10*3/uL (ref 150–400)
RBC: 4.44 MIL/uL (ref 3.87–5.11)
RDW: 14.1 % (ref 11.5–15.5)
WBC: 5.8 10*3/uL (ref 4.0–10.5)

## 2014-12-04 LAB — COMPREHENSIVE METABOLIC PANEL
ALT: 17 U/L (ref 14–54)
AST: 19 U/L (ref 15–41)
Albumin: 3.8 g/dL (ref 3.5–5.0)
Alkaline Phosphatase: 54 U/L (ref 38–126)
Anion gap: 5 (ref 5–15)
BILIRUBIN TOTAL: 0.6 mg/dL (ref 0.3–1.2)
BUN: 15 mg/dL (ref 6–20)
CHLORIDE: 108 mmol/L (ref 101–111)
CO2: 27 mmol/L (ref 22–32)
CREATININE: 0.79 mg/dL (ref 0.44–1.00)
Calcium: 9.8 mg/dL (ref 8.9–10.3)
Glucose, Bld: 93 mg/dL (ref 65–99)
POTASSIUM: 4.3 mmol/L (ref 3.5–5.1)
Sodium: 140 mmol/L (ref 135–145)
TOTAL PROTEIN: 7 g/dL (ref 6.5–8.1)

## 2014-12-04 NOTE — Patient Instructions (Signed)
[place] at [facility] Discharge Instructions  RECOMMENDATIONS MADE BY THE CONSULTANT AND ANY TEST RESULTS WILL BE SENT TO YOUR REFERRING PHYSICIAN.  Exam and discussion by Robynn Pane, PA-C Will check some labs today and will let you know of any issues or concerns Referral to Dr. Arnoldo Morale for colonoscopy i September Mammogram due in December  Labs and office visit in 12 months.  Thank you for choosing [place] at [facility] to provide your oncology and hematology care.  To afford each patient quality time with our provider, please arrive at least 15 minutes before your scheduled appointment time.    You need to re-schedule your appointment should you arrive 10 or more minutes late.  We strive to give you quality time with our providers, and arriving late affects you and other patients whose appointments are after yours.  Also, if you no show three or more times for appointments you may be dismissed from the clinic at the providers discretion.     Again, thank you for choosing [facility].  Our hope is that these requests will decrease the amount of time that you wait before being seen by our physicians.       _____________________________________________________________  Should you have questions after your visit to [facility], please contact our office at [phone] between the hours of 8:30 a.m. and 4:30 p.m.  Voicemails left after 4:30 p.m. will not be returned until the following business day.  For prescription refill requests, have your pharmacy contact our office.

## 2015-02-24 ENCOUNTER — Telehealth: Payer: Self-pay | Admitting: Family Medicine

## 2015-02-24 DIAGNOSIS — R928 Other abnormal and inconclusive findings on diagnostic imaging of breast: Secondary | ICD-10-CM

## 2015-02-24 NOTE — Telephone Encounter (Signed)
Order needed for Diagnostic Mammogram. May we put order in?

## 2015-02-24 NOTE — Telephone Encounter (Signed)
Called patient and informed her per Dr.Steve Luking- Orders for diagnostic Mammogram put in and patient can now schedule mammogram. Patient verbalized understanding.

## 2015-02-24 NOTE — Telephone Encounter (Signed)
ok 

## 2015-02-24 NOTE — Telephone Encounter (Signed)
Patient was told by the hospital that if she wants a mammogram done, our office would have to schedule it for her. She wants the diagnostic mammogram done before 2/27 so she can go over it at her physical appointment with Hoyle Sauer.

## 2015-02-26 ENCOUNTER — Other Ambulatory Visit: Payer: Self-pay

## 2015-02-26 DIAGNOSIS — N63 Unspecified lump in unspecified breast: Secondary | ICD-10-CM

## 2015-03-05 ENCOUNTER — Other Ambulatory Visit: Payer: Self-pay | Admitting: *Deleted

## 2015-03-05 DIAGNOSIS — R928 Other abnormal and inconclusive findings on diagnostic imaging of breast: Secondary | ICD-10-CM

## 2015-03-09 ENCOUNTER — Encounter: Payer: Self-pay | Admitting: Nurse Practitioner

## 2015-03-09 ENCOUNTER — Ambulatory Visit (INDEPENDENT_AMBULATORY_CARE_PROVIDER_SITE_OTHER): Payer: 59 | Admitting: Nurse Practitioner

## 2015-03-09 VITALS — BP 122/82 | Ht 66.5 in | Wt 169.1 lb

## 2015-03-09 DIAGNOSIS — I10 Essential (primary) hypertension: Secondary | ICD-10-CM | POA: Diagnosis not present

## 2015-03-09 DIAGNOSIS — F419 Anxiety disorder, unspecified: Secondary | ICD-10-CM

## 2015-03-09 DIAGNOSIS — I8392 Asymptomatic varicose veins of left lower extremity: Secondary | ICD-10-CM | POA: Diagnosis not present

## 2015-03-09 MED ORDER — CITALOPRAM HYDROBROMIDE 20 MG PO TABS: ORAL_TABLET | ORAL | Status: DC

## 2015-03-09 MED ORDER — LABETALOL HCL 100 MG PO TABS: 100.0000 mg | ORAL_TABLET | Freq: Two times a day (BID) | ORAL | Status: DC

## 2015-03-09 NOTE — Progress Notes (Signed)
Subjective:  Presents for routine followup. Has appointment next week for preventive health physical. It took about 2 months but working fine now. No CP/ischemic type pain or SOB. Has varicose veins particularly on left lower leg which has been causing some increased edema, stinging and itching. Wears support hose/socks in the winter.   Objective:   BP 122/82 mmHg  Ht 5' 6.5" (1.689 m)  Wt 169 lb 2 oz (76.715 kg)  BMI 26.89 kg/m2 NAD. Alert, oriented. Lungs clear. Heart RRR. Left lower leg: several dilated varicose veins noted.   Assessment:  Problem List Items Addressed This Visit      Cardiovascular and Mediastinum   HTN (hypertension) - Primary   Relevant Medications   labetalol (NORMODYNE) 100 MG tablet   Varicose veins of left lower extremity   Relevant Medications   labetalol (NORMODYNE) 100 MG tablet     Other   Anxiety   Relevant Medications   citalopram (CELEXA) 20 MG tablet     Plan:  Meds ordered this encounter  Medications  . citalopram (CELEXA) 20 MG tablet    Sig: 1 TABLET BY MOUTH EVERY NIGHT AT BEDTIME    Dispense:  30 tablet    Refill:  5    Order Specific Question:  Supervising Provider    Answer:  Mikey Kirschner [[id number]]  . labetalol (NORMODYNE) 100 MG tablet    Sig: Take 1 tablet (100 mg total) by mouth 2 (two) times daily.    Dispense:  60 tablet    Refill:  5    Order Specific Question:  Supervising Provider    Answer:  Maggie Font    Given information on vein clinics in [place]. Patient to schedule her own appointment; call back if referral is needed.  Return in about 6 months (around 09/06/2015) for recheck. Follow up next week at [facility]. Hold on lipid profile since this was normal Nov 2015; patient has had repeated labs from different specialists. Patient to request lipid profile with her next labs and notify our office.

## 2015-03-10 ENCOUNTER — Encounter (HOSPITAL_COMMUNITY): Payer: 59

## 2015-03-10 ENCOUNTER — Ambulatory Visit (HOSPITAL_COMMUNITY)
Admission: RE | Admit: 2015-03-10 | Discharge: 2015-03-10 | Disposition: A | Discharge status: Home / Self Care | Payer: 59 | Source: Ambulatory Visit | Attending: Family Medicine | Admitting: Family Medicine

## 2015-03-10 DIAGNOSIS — N63 Unspecified lump in breast: Secondary | ICD-10-CM | POA: Insufficient documentation

## 2015-03-16 ENCOUNTER — Encounter: Payer: Self-pay | Admitting: Nurse Practitioner

## 2015-03-16 ENCOUNTER — Ambulatory Visit (INDEPENDENT_AMBULATORY_CARE_PROVIDER_SITE_OTHER): Payer: 59 | Admitting: Nurse Practitioner

## 2015-03-16 VITALS — BP 132/86 | Ht 66.5 in | Wt 168.2 lb

## 2015-03-16 DIAGNOSIS — Z124 Encounter for screening for malignant neoplasm of cervix: Secondary | ICD-10-CM

## 2015-03-16 DIAGNOSIS — Z1151 Encounter for screening for human papillomavirus (HPV): Secondary | ICD-10-CM

## 2015-03-16 DIAGNOSIS — R413 Other amnesia: Secondary | ICD-10-CM | POA: Diagnosis not present

## 2015-03-16 DIAGNOSIS — Z0000 Encounter for general adult medical examination without abnormal findings: Secondary | ICD-10-CM

## 2015-03-16 DIAGNOSIS — Z01419 Encounter for gynecological examination (general) (routine) without abnormal findings: Secondary | ICD-10-CM

## 2015-03-18 ENCOUNTER — Encounter: Payer: Self-pay | Admitting: Nurse Practitioner

## 2015-03-18 NOTE — Progress Notes (Signed)
   Subjective:    Patient ID: Kristin [name], female    DOB: Sep 09, 1955, 60 y.o.   MRN: [id number]  HPI presents for her wellness exam. No vaginal bleeding or pelvic pain. No new sexual partners. Regular vision exams. Has dental exam planned. Has had mammogram and DEXA scan. Has colonoscopy planned in December. History of TIA and hemorrhagic stroke. No recent neurologic check up. Has noticed mixing up words and numbers; hard time searching for words. Began in July. No difficulty speaking. No numbness of weakness of the face arms or legs. No headaches or visual changes. Concerned about early dementia.     Review of Systems  Constitutional: Negative for fever, activity change, appetite change and fatigue.  HENT: Negative for dental problem, ear pain, sinus pressure and sore throat.   Respiratory: Negative for cough, chest tightness, shortness of breath and wheezing.   Cardiovascular: Negative for chest pain.       Varicose veins left leg with occasional edema.   Gastrointestinal: Negative for nausea, vomiting, abdominal pain, diarrhea, constipation and abdominal distention.  Genitourinary: Negative for dysuria, urgency, frequency, vaginal bleeding, vaginal discharge, enuresis, difficulty urinating, genital sores and pelvic pain.       Objective:   Physical Exam  Constitutional: She is oriented to person, place, and time. She appears well-developed. No distress.  HENT:  Right Ear: External ear normal.  Left Ear: External ear normal.  Mouth/Throat: Oropharynx is clear and moist.  Neck: Normal range of motion. Neck supple. No tracheal deviation present. No thyromegaly present.  Cardiovascular: Normal rate, regular rhythm and normal heart sounds.  Exam reveals no gallop.   No murmur heard. Pulmonary/Chest: Effort normal and breath sounds normal.  Abdominal: Soft. She exhibits no distension. There is no tenderness.  Genitourinary: Vagina normal and uterus normal. No vaginal discharge found.    External GU: no rashes or lesions. Vagina: no discharge. No CMT. Bimanual exam: no tenderness or obvious masses. Rectal exam deferred due to upcoming colonoscopy.   Musculoskeletal: She exhibits no edema.  Lymphadenopathy:    She has no cervical adenopathy.  Neurological: She is alert and oriented to person, place, and time.  Skin: Skin is warm and dry. No rash noted.  Psychiatric: She has a normal mood and affect. Her behavior is normal.  Vitals reviewed. Breast exam: no masses noted; axillae no adenopathy.         Assessment & Plan:  Well woman exam - Plan: Pap IG and HPV (high risk) DNA detection  Screening for cervical cancer - Plan: Pap IG and HPV (high risk) DNA detection  Screening for HPV (human papillomavirus) - Plan: Pap IG and HPV (high risk) DNA detection  Memory problem  Will refer to neurologist for evaluation. Call back in the meantime if symptoms worsen.  Return in about 6 months (around 09/13/2015) for recheck.

## 2015-03-21 LAB — PAP IG AND HPV HIGH-RISK
HPV, high-risk: NEGATIVE
PAP SMEAR COMMENT: 0

## 2015-03-23 ENCOUNTER — Encounter: Payer: Self-pay | Admitting: Family Medicine

## 2015-04-08 ENCOUNTER — Telehealth: Payer: Self-pay | Admitting: *Deleted

## 2015-04-08 ENCOUNTER — Ambulatory Visit: Payer: 59 | Admitting: Diagnostic Neuroimaging

## 2015-04-08 NOTE — Telephone Encounter (Signed)
Spoke with patient and rescheduled for 04/17/15 due to provider out of office with illness today. She verbalized understanding.

## 2015-04-17 ENCOUNTER — Ambulatory Visit: Payer: Self-pay | Admitting: Diagnostic Neuroimaging

## 2015-09-07 ENCOUNTER — Ambulatory Visit (INDEPENDENT_AMBULATORY_CARE_PROVIDER_SITE_OTHER): Payer: 59 | Admitting: Nurse Practitioner

## 2015-09-07 ENCOUNTER — Encounter: Payer: Self-pay | Admitting: Nurse Practitioner

## 2015-09-07 VITALS — BP 112/76 | Ht 66.25 in | Wt 162.0 lb

## 2015-09-07 DIAGNOSIS — E559 Vitamin D deficiency, unspecified: Secondary | ICD-10-CM | POA: Diagnosis not present

## 2015-09-07 DIAGNOSIS — Z79899 Other long term (current) drug therapy: Secondary | ICD-10-CM

## 2015-09-07 DIAGNOSIS — I872 Venous insufficiency (chronic) (peripheral): Secondary | ICD-10-CM

## 2015-09-07 DIAGNOSIS — I8312 Varicose veins of left lower extremity with inflammation: Secondary | ICD-10-CM | POA: Diagnosis not present

## 2015-09-07 DIAGNOSIS — I8311 Varicose veins of right lower extremity with inflammation: Secondary | ICD-10-CM | POA: Diagnosis not present

## 2015-09-07 DIAGNOSIS — I10 Essential (primary) hypertension: Secondary | ICD-10-CM | POA: Diagnosis not present

## 2015-09-07 DIAGNOSIS — Z13220 Encounter for screening for lipoid disorders: Secondary | ICD-10-CM | POA: Diagnosis not present

## 2015-09-07 DIAGNOSIS — E213 Hyperparathyroidism, unspecified: Secondary | ICD-10-CM

## 2015-09-07 DIAGNOSIS — F419 Anxiety disorder, unspecified: Secondary | ICD-10-CM | POA: Diagnosis not present

## 2015-09-07 MED ORDER — LABETALOL HCL 100 MG PO TABS: 100.0000 mg | ORAL_TABLET | Freq: Two times a day (BID) | ORAL | 5 refills | Status: DC

## 2015-09-07 MED ORDER — CITALOPRAM HYDROBROMIDE 20 MG PO TABS: ORAL_TABLET | ORAL | 5 refills | Status: DC

## 2015-09-07 NOTE — Progress Notes (Signed)
Subjective:  Presents for routine follow up. Was off BP pill for awhile, started back 3 days ago. BP was elevated off med but is not sure if her machine was correct. No CP/ischemic type pain or SOB. Minimal edema. Continues to have rash on lower legs off/on due to varicose veins. Uses Clobetasol sparingly. Still has a tube left over from last year. Has not seen her specialist Dr. Harlow Asa lately. Is due for labs. Anxiety stable with Celexa.   Objective:   BP 112/76   Ht 5' 6.25" (1.683 m)   Wt 162 lb (73.5 kg)   BMI 25.95 kg/m  NAD. Alert, oriented. Lungs clear. Heart RRR. Carotids no bruits or thrills. Lower extremities patches of slightly raised dry mildly erythematous rash. Significant superficial varicose veins noted.  Assessment:  Problem List Items Addressed This Visit      Cardiovascular and Mediastinum   HTN (hypertension) - Primary   Relevant Medications   labetalol (NORMODYNE) 100 MG tablet   Other Relevant Orders   Basic metabolic panel     Endocrine   Hyperparathyroidism ([place])   Relevant Orders   TSH   VITAMIN D 25 Hydroxy (Vit-D Deficiency, Fractures)   PTH, Intact and Calcium     Musculoskeletal and Integument   Venous stasis dermatitis     Other   Anxiety   Relevant Medications   citalopram (CELEXA) 20 MG tablet   Vitamin D deficiency   Relevant Orders   VITAMIN D 25 Hydroxy (Vit-D Deficiency, Fractures)   PTH, Intact and Calcium    Other Visit Diagnoses    Screening for cholesterol level       Relevant Orders   Lipid panel   High risk medication use       Relevant Orders   Hepatic function panel     Plan:  Meds ordered this encounter  Medications  . citalopram (CELEXA) 20 MG tablet    Sig: 1 TABLET BY MOUTH EVERY NIGHT AT BEDTIME    Dispense:  30 tablet    Refill:  5    Order Specific Question:   Supervising Provider    Answer:   Mikey Kirschner [[id number]]  . labetalol (NORMODYNE) 100 MG tablet    Sig: Take 1 tablet (100 mg total) by mouth 2 (two)  times daily.    Dispense:  60 tablet    Refill:  5    Order Specific Question:   Supervising Provider    Answer:   Mikey Kirschner [[id number]]   Patient plans to get physical this fall. To get labs before visit. Continue current meds as directed.  Return in about 6 months (around 03/09/2016) for recheck.

## 2015-09-16 ENCOUNTER — Ambulatory Visit: Payer: 59 | Admitting: Family Medicine

## 2015-12-01 ENCOUNTER — Other Ambulatory Visit (HOSPITAL_COMMUNITY): Payer: Self-pay | Admitting: *Deleted

## 2015-12-01 DIAGNOSIS — C189 Malignant neoplasm of colon, unspecified: Secondary | ICD-10-CM

## 2015-12-03 ENCOUNTER — Ambulatory Visit (HOSPITAL_COMMUNITY): Payer: 59 | Admitting: Oncology

## 2015-12-03 ENCOUNTER — Other Ambulatory Visit (HOSPITAL_COMMUNITY): Payer: 59

## 2016-01-08 ENCOUNTER — Encounter (HOSPITAL_COMMUNITY): Payer: Managed Care, Other (non HMO) | Attending: Oncology | Admitting: Oncology

## 2016-01-08 ENCOUNTER — Telehealth (HOSPITAL_COMMUNITY): Payer: Self-pay | Admitting: Oncology

## 2016-01-08 ENCOUNTER — Encounter (HOSPITAL_COMMUNITY): Payer: Self-pay | Admitting: Oncology

## 2016-01-08 ENCOUNTER — Encounter (HOSPITAL_COMMUNITY): Payer: Managed Care, Other (non HMO)

## 2016-01-08 VITALS — BP 152/84 | HR 63 | Temp 98.0°F | Resp 16 | Wt 157.0 lb

## 2016-01-08 DIAGNOSIS — C189 Malignant neoplasm of colon, unspecified: Secondary | ICD-10-CM | POA: Insufficient documentation

## 2016-01-08 DIAGNOSIS — Z8249 Family history of ischemic heart disease and other diseases of the circulatory system: Secondary | ICD-10-CM | POA: Insufficient documentation

## 2016-01-08 DIAGNOSIS — C180 Malignant neoplasm of cecum: Secondary | ICD-10-CM | POA: Insufficient documentation

## 2016-01-08 DIAGNOSIS — E785 Hyperlipidemia, unspecified: Secondary | ICD-10-CM | POA: Insufficient documentation

## 2016-01-08 DIAGNOSIS — Z809 Family history of malignant neoplasm, unspecified: Secondary | ICD-10-CM | POA: Diagnosis not present

## 2016-01-08 DIAGNOSIS — Z9889 Other specified postprocedural states: Secondary | ICD-10-CM | POA: Diagnosis not present

## 2016-01-08 DIAGNOSIS — Z0000 Encounter for general adult medical examination without abnormal findings: Secondary | ICD-10-CM

## 2016-01-08 DIAGNOSIS — I10 Essential (primary) hypertension: Secondary | ICD-10-CM | POA: Diagnosis not present

## 2016-01-08 LAB — COMPREHENSIVE METABOLIC PANEL
ALBUMIN: 4.3 g/dL (ref 3.5–5.0)
ALK PHOS: 52 U/L (ref 38–126)
ALT: 20 U/L (ref 14–54)
ANION GAP: 4 — AB (ref 5–15)
AST: 24 U/L (ref 15–41)
BILIRUBIN TOTAL: 0.5 mg/dL (ref 0.3–1.2)
BUN: 14 mg/dL (ref 6–20)
CALCIUM: 10.2 mg/dL (ref 8.9–10.3)
CO2: 29 mmol/L (ref 22–32)
Chloride: 104 mmol/L (ref 101–111)
Creatinine, Ser: 0.77 mg/dL (ref 0.44–1.00)
GFR calc Af Amer: >60 mL/min (ref >60)
GLUCOSE: 105 mg/dL — AB (ref 65–99)
Potassium: 4.2 mmol/L (ref 3.5–5.1)
Sodium: 137 mmol/L (ref 135–145)
TOTAL PROTEIN: 7.3 g/dL (ref 6.5–8.1)

## 2016-01-08 LAB — CBC WITH DIFFERENTIAL/PLATELET
BASOS PCT: 1 %
Basophils Absolute: 0.1 10*3/uL (ref 0.0–0.1)
Eosinophils Absolute: 0.1 10*3/uL (ref 0.0–0.7)
Eosinophils Relative: 1 %
HEMATOCRIT: 44.4 % (ref 36.0–46.0)
HEMOGLOBIN: 14.3 g/dL (ref 12.0–15.0)
LYMPHS PCT: 17 %
Lymphs Abs: 1 10*3/uL (ref 0.7–4.0)
MCH: 29.5 pg (ref 26.0–34.0)
MCHC: 32.2 g/dL (ref 30.0–36.0)
MCV: 91.7 fL (ref 78.0–100.0)
MONO ABS: 0.5 10*3/uL (ref 0.1–1.0)
MONOS PCT: 8 %
NEUTROS ABS: 4.3 10*3/uL (ref 1.7–7.7)
NEUTROS PCT: 73 %
Platelets: 211 10*3/uL (ref 150–400)
RBC: 4.84 MIL/uL (ref 3.87–5.11)
RDW: 14 % (ref 11.5–15.5)
WBC: 6 10*3/uL (ref 4.0–10.5)

## 2016-01-08 LAB — LIPID PANEL
CHOL/HDL RATIO: 2.8 ratio
Cholesterol: 210 mg/dL — ABNORMAL HIGH (ref 0–200)
HDL: 74 mg/dL (ref >40)
LDL CALC: 125 mg/dL — AB (ref 0–99)
Triglycerides: 54 mg/dL (ref <150)
VLDL: 11 mg/dL (ref 0–40)

## 2016-01-08 NOTE — Assessment & Plan Note (Addendum)
Stage IIA (T3, N0, M0) mildly well-differentiated adenocarcinoma of the cecum status post surgical resection by Dr. Aviva Signs on 09/13/2006 with the finding of 28 benign lymph nodes. No LV I was seen and she was not given adjuvant chemotherapy.    She has completed active surveillance per NCCN guidelines.  Labs today: CBC diff, CMET.  I personally reviewed and went over laboratory results with the patient.  The results are noted within this dictation.  She asks about cholesterol.  We did not check this as it is not needed from an oncology perspective.  She notes that she did fast.  We will see if we can add a lipid panel today to her already drawn labs.  We discovered that a lipid panel can be added to her labs today.  She did confirm that she fasted.  Will fax results to her primary care provider for evaluation and management.  Labs in 12 months: CBC diff, CMET, lipid panel.  She requests that we check lipid panel again next year.  I personally reviewed and went over radiographic studies with the patient.  The results are noted within this dictation.  Mammogram from Feb 2017 is reviewed.  She is due for her next mammogram in Feb 2018.  Order is placed for mammogram in Feb 2018.  Colonoscopy was due in Sept 2017 by Dr. Arnoldo Morale.  She is overdue for this and therefore, we will refer the patient to Dr. Arnoldo Morale for scheduling of colonoscopy.  Return in 12 months for follow-up.  Next year, she will be 10 years out from definitive surgical resection of Stage IIA CRC by Dr. Arnoldo Morale.  After 10 years of follow-up in 2018, we will release the patient from the [facility] with regular follow-up with primary care provider, Dr. Wolfgang Phoenix, as long as the patient is agreeable with GI follow-up as directed for colonoscopies.  Current plan is to see the patient back in 12 months for annual follow-up/surveillance and in 2018, we will release the patient.

## 2016-01-08 NOTE — Patient Instructions (Addendum)
[place] at [facility] Discharge Instructions  RECOMMENDATIONS MADE BY THE CONSULTANT AND ANY TEST RESULTS WILL BE SENT TO YOUR REFERRING PHYSICIAN.  You saw Kirby Crigler, PA-C, today. Will refer to Dr. Arnoldo Morale for colonoscopy. Labs and follow up in 1 year with Tom. See Amy at checkout for appointments.  Thank you for choosing [place] at [facility] to provide your oncology and hematology care.  To afford each patient quality time with our provider, please arrive at least 15 minutes before your scheduled appointment time.   Beginning January 23rd 2017 lab work for the [facility] will be done in the  Main lab at [facility] on 1st floor. If you have a lab appointment with the [facility] please come in thru the  Main Entrance and check in at the main information desk  You need to re-schedule your appointment should you arrive 10 or more minutes late.  We strive to give you quality time with our providers, and arriving late affects you and other patients whose appointments are after yours.  Also, if you no show three or more times for appointments you may be dismissed from the clinic at the providers discretion.     Again, thank you for choosing [facility].  Our hope is that these requests will decrease the amount of time that you wait before being seen by our physicians.       _____________________________________________________________  Should you have questions after your visit to [facility], please contact our office at [phone] between the hours of 8:30 a.m. and 4:30 p.m.  Voicemails left after 4:30 p.m. will not be returned until the following business day.  For prescription refill requests, have your pharmacy contact our office.         Resources For Cancer Patients and their Caregivers ? American Cancer Society: Can assist with transportation, wigs, general needs, runs Look Good Feel Better.         (207) 705-4102 ? Cancer Care: Provides financial assistance, online support groups, medication/co-pay assistance.  1-800-813-HOPE 709-712-9088) ? [place] Assists [place] cancer patients and their families through emotional , educational and financial support.  706 608 1319 ? [place] DSS Where to apply for food stamps, Medicaid and utility assistance. (860)062-6453 ? [facility]: Transportation to medical appointments. 847-401-0548 ? Social Security Administration: May apply for disability if have a Stage IV cancer. 906-392-3025 781-035-7582 ? [employer], Disability and Transit Services: Assists with nutrition, care and transit needs. [place] Support Programs: @10RELATIVEDAYS @ > Cancer Support Group  2nd Tuesday of the month 1pm-2pm, Journey Room  > Creative Journey  3rd Tuesday of the month 1130am-1pm, Journey Room  > Look Good Feel Better  1st Wednesday of the month 10am-12 noon, Journey Room (Call [place] to register [phone])

## 2016-01-08 NOTE — Progress Notes (Signed)
Kristin Hillier, MD [place] [address]  Adenocarcinoma of colon [facility]) - Plan: Lipid panel, Lipid panel, CBC with Differential, Comprehensive metabolic panel  Preventative health care - Plan: MM SCREENING BREAST TOMO BILATERAL, Lipid panel, Lipid panel, Lipid panel  CURRENT THERAPY: Surveillance per NCCN guidelines  INTERVAL HISTORY: Kristin [name] 60 y.o. 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 Aviva Signs on 09/13/2006 with the finding of 28 benign lymph nodes. No LV I was seen and she was not given adjuvant chemotherapy.      Adenocarcinoma of colon ([place])   09/13/2006 Pathology Results    Right segmental resection by Dr. Arnoldo Morale      09/13/2006 Definitive Surgery    COLON, RIGHT, SEGMENTAL RESECTION: COLONIC ADENOCARCINOMA FOCALLY INVADING THROUGH MUSCULARIS PROPRIA, MARGINS NOT INVOLVED. TWENTY-EIGHT BENIGN LYMPH NODES (0/28).        She reports a great 2017.    She is due for colonoscopy.  Her last one was in Sept 2012 by Dr. Arnoldo Morale.  She is due for another.  She denies any blood in her stool and dark stools.  She denies any change in bowel habits, stool caliber, color, and frequency.  She reports that her sister has made contact with her recently after not seeing her for 1 year.  This has caused a significant amount of stress in her life.   She notes B/L leg edema that is better in AM and worse in PM.  Review of Systems  Constitutional: Negative.  Negative for chills, fever, malaise/fatigue and weight loss.  HENT: Negative.   Eyes: Negative.   Respiratory: Negative.  Negative for cough.   Cardiovascular: Positive for leg swelling. Negative for chest pain.  Gastrointestinal: Negative.  Negative for abdominal pain, blood in stool, constipation, diarrhea and melena.  Genitourinary: Negative.  Negative for dysuria.    Musculoskeletal: Negative.   Skin: Negative.   Neurological: Negative.  Negative for weakness.  Endo/Heme/Allergies: Negative.   Psychiatric/Behavioral: The patient is nervous/anxious.     Past Medical History:  Diagnosis Date  . Adenocarcinoma of colon ([place]) 2008  . Anemia   . 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 Complication of anesthesia    "need childsized tube to go down my throat/dr in 2008" (05/14/2012)  . Hyperlipidemia   . Hyperparathyroidism ([place]) 01/2012  . Hypertension 05/14/2012   "starting today" (05/14/2012)  . Lyme disease 2010    Past Surgical History:  Procedure Laterality Date  . COLON RESECTION  2008  . COLON SURGERY    . COLONOSCOPY  10/12/2010   Procedure: COLONOSCOPY;  Surgeon: Jamesetta So;  Location: [facility];  Service: Gastroenterology;  Laterality: N/A;  . FACIAL COSMETIC SURGERY     eyelids  . WISDOM TOOTH EXTRACTION  2000's   "2" (05/14/2012)    Family History  Problem Relation Age of Onset  . Cancer Maternal Grandfather   . Hypertension Mother   . Heart attack Father     Social History   Social History  . Marital status: Married    Spouse name: N/A  . Number of children: N/A  . Years of education: N/A   Social History Main Topics  . Smoking status: Never Smoker  . Smokeless tobacco: Never Used  . Alcohol use No  . Drug use: No  . Sexual activity: Yes   Other Topics Concern  . None  Social History Narrative  . None     PHYSICAL EXAMINATION  ECOG PERFORMANCE STATUS: 0 - Asymptomatic  Vitals:   01/08/16 1038  BP: (!) 152/84  Pulse: 63  Resp: 16  Temp: 98 F (36.7 C)    GENERAL:alert, healthy, no distress, well nourished, well developed, comfortable, cooperative, smiling and unaccompanied SKIN: skin color, texture, turgor are normal, no rashes or significant lesions HEAD: Normocephalic, No masses, lesions, tenderness or abnormalities EYES: normal, EOMI, Conjunctiva are pink and non-injected EARS: External ears  normal OROPHARYNX:lips, buccal mucosa, and tongue normal and mucous membranes are moist  NECK: supple, no adenopathy, thyroid normal size, non-tender, without nodularity, trachea midline LYMPH:  no palpable lymphadenopathy, no hepatosplenomegaly BREAST:not examined LUNGS: clear to auscultation and percussion HEART: regular rate & rhythm, no murmurs, no gallops, S1 normal and S2 normal ABDOMEN:abdomen soft, non-tender, normal bowel sounds, no masses or organomegaly and no hepatosplenomegaly BACK: Back symmetric, no curvature., No CVA tenderness EXTREMITIES:less then 2 second capillary refill, no joint deformities, effusion, or inflammation, no skin discoloration, no clubbing, no cyanosis  NEURO: alert & oriented x 3 with fluent speech, no focal motor/sensory deficits, gait normal   LABORATORY DATA: CBC    Component Value Date/Time   WBC 6.0 01/08/2016 1018   RBC 4.84 01/08/2016 1018   HGB 14.3 01/08/2016 1018   HCT 44.4 01/08/2016 1018   PLT 211 01/08/2016 1018   MCV 91.7 01/08/2016 1018   MCH 29.5 01/08/2016 1018   MCHC 32.2 01/08/2016 1018   RDW 14.0 01/08/2016 1018   LYMPHSABS 1.0 01/08/2016 1018   MONOABS 0.5 01/08/2016 1018   EOSABS 0.1 01/08/2016 1018   BASOSABS 0.1 01/08/2016 1018      Chemistry      Component Value Date/Time   NA 137 01/08/2016 1018   K 4.2 01/08/2016 1018   CL 104 01/08/2016 1018   CO2 29 01/08/2016 1018   BUN 14 01/08/2016 1018   CREATININE 0.77 01/08/2016 1018      Component Value Date/Time   CALCIUM 10.2 01/08/2016 1018   ALKPHOS 52 01/08/2016 1018   AST 24 01/08/2016 1018   ALT 20 01/08/2016 1018   BILITOT 0.5 01/08/2016 1018        PENDING LABS:   RADIOGRAPHIC STUDIES:  No results found.   PATHOLOGY:    ASSESSMENT AND PLAN:  Adenocarcinoma of colon ([facility]) Stage IIA (T3, N0, M0) mildly well-differentiated adenocarcinoma of the cecum status post surgical resection by Dr. Aviva Signs on 09/13/2006 with the finding of 28  benign lymph nodes. No LV I was seen and she was not given adjuvant chemotherapy.    She has completed active surveillance per NCCN guidelines.  Labs today: CBC diff, CMET.  I personally reviewed and went over laboratory results with the patient.  The results are noted within this dictation.  She asks about cholesterol.  We did not check this as it is not needed from an oncology perspective.  She notes that she did fast.  We will see if we can add a lipid panel today to her already drawn labs.  We discovered that a lipid panel can be added to her labs today.  She did confirm that she fasted.  Will fax results to her primary care provider for evaluation and management.  Labs in 12 months: CBC diff, CMET, lipid panel.  She requests that we check lipid panel again next year.  I personally reviewed and went over radiographic studies with the patient.  The results  are noted within this dictation.  Mammogram from Feb 2017 is reviewed.  She is due for her next mammogram in Feb 2018.  Order is placed for mammogram in Feb 2018.  Colonoscopy was due in Sept 2017 by Dr. Arnoldo Morale.  She is overdue for this and therefore, we will refer the patient to Dr. Arnoldo Morale for scheduling of colonoscopy.  Return in 12 months for follow-up.  Next year, she will be 10 years out from definitive surgical resection of Stage IIA CRC by Dr. Arnoldo Morale.  After 10 years of follow-up in 2018, we will release the patient from the [facility] with regular follow-up with primary care provider, Dr. Wolfgang Phoenix, as long as the patient is agreeable with GI follow-up as directed for colonoscopies.  Current plan is to see the patient back in 12 months for annual follow-up/surveillance and in 2018, we will release the patient.   ORDERS PLACED FOR THIS ENCOUNTER: Orders Placed This Encounter  Procedures  . MM SCREENING BREAST TOMO BILATERAL  . Lipid panel  . Lipid panel  . CBC with Differential  . Comprehensive metabolic panel     MEDICATIONS PRESCRIBED THIS ENCOUNTER: No orders of the defined types were placed in this encounter.   THERAPY PLAN:  NCCN guidelines for surveillance for Colon cancer are as follows (1.2017):  A. Stage I   1. Colonoscopy at year 1    A. If advanced adenoma, repeat in 1 year    B. If no advanced adenoma, repeat in 3 years, and then every 5 years.  B. Stage II, Stage III   1. H+P every 3-6 months x 2 years and then every 6 months for a total of 5 years    2. CEA every 3-6 months x 2 years and then every 6 months for a total of 5 years    3. CT CAP every 6-12 months (category 2B for frequency < 12 months) for a total of 5 years .   4.  Colonoscopy in 1 year except if no preoperative colonoscopy due to obstructing lesion, colonoscopy in 3-6 months.     A. If advanced adenoma, repeat in 1 year    B. If no advanced adenoma, repeat in 3 years, then every 5 years   5. PET/CT scan is not recommended.  C. Stage IV   1. H+P every 3-6 months x 2 years and then every 6 months for a total of 5 years    2. CEA every 3 months x 2 years and then every 6 months for a total of 3- 5 years    3. CT CAP every 3-6 months (category 2B for frequency < 6 months) x 2 years., then every 6-12 months for a total of 5 years .   4. Colonoscopy in 1 year except if no preoperative colonoscopy due to obstructing lesion, colonoscopy in 3-6 months.     A. If advanced adenoma, repeat in 1 year    B. If no advanced adenoma, repeat in 3 years, then every 5 years   All questions were answered. The patient knows to call the clinic with any problems, questions or concerns. We can certainly see the patient much sooner if necessary.  Patient and plan discussed with Dr. Ancil Linsey and she is in agreement with the aforementioned.   This note is electronically signed by: Doy Mince 01/08/2016 11:13 AM

## 2016-01-12 ENCOUNTER — Telehealth (HOSPITAL_COMMUNITY): Payer: Self-pay | Admitting: Oncology

## 2016-01-12 NOTE — Telephone Encounter (Signed)
FAXED COLONOSCOPY REF TO DR Arnoldo Morale

## 2016-01-28 NOTE — H&P (Signed)
  NTS SOAP Note  Vital Signs:  Vitals as of: A999333: Systolic Q000111Q: Diastolic 93: Heart Rate 64: Temp 97.50F (Temporal): Height 68ft 6.5in: Weight 161Lbs 0 Ounces: BMI 25.6   BMI : 25.6 kg/m2  Subjective: This 61 year old female presents for of need for follow up colonoscopy.  Last had a TCS 2012 for follow up of colon cancer.  Denies any lower gi complaints.  No family h/o colon carcinoma.  Referred by Oncology.  Review of Symptoms:  Constitutional:negative Head:negative Eyes:negative Nose/Mouth/Throat:negative Cardiovascular:negative Respiratory:negative Gastrointestinnegative Genitourinary:negative Musculoskeletal:negative dry skin Hematolgic/Lymphatic:negative Allergic/Immunologic:negative   Past Medical History:Reviewed  Past Medical History  Surgical History: right hemicolectomy 2008, TCS 2012 Medical Problems: h/o colon cancer, h/o brain bleed Allergies: keflex Medications: citalopram   Social History:Reviewed  Social History  Preferred Language: English Race:  White Ethnicity: Not Hispanic / Latino Age: 61 year Marital Status:  M Alcohol: no   Smoking Status: Never smoker reviewed on 01/28/2016 Functional Status reviewed on 01/28/2016 ------------------------------------------------ Bathing: Normal Cooking: Normal Dressing: Normal Driving: Normal Eating: Normal Managing Meds: Normal Oral Care: Normal Shopping: Normal Toileting: Normal Transferring: Normal Walking: Normal Cognitive Status reviewed on 01/28/2016 ------------------------------------------------ Attention: Normal Decision Making: Normal Language: Normal Memory: Normal Motor: Normal Perception: Normal Problem Solving: Normal Visual and Spatial: Normal   Family History:Reviewed  Family Health History Mother, Deceased; Heart attack (myocardial infarction);  Father, Deceased; Heart attack (myocardial infarction);     Objective  Information: General:Well appearing, well nourished in no distress. Head:Atraumatic; no masses; no abnormalities Neck:Supple without lymphadenopathy.  Heart:RRR, no murmur or gallop.  Normal S1, S2.  No S3, S4.  Lungs:CTA bilaterally, no wheezes, rhonchi, rales.  Breathing unlabored. Abdomen:Soft, NT/ND, normal bowel sounds, no HSM, no masses.  No peritoneal signs. deferred to procedure Dr. Donald Pore notes reviewed Assessment:Personal h/o colon cancer  Diagnoses: V10.06  Z85.048 History of malignant neoplasm of gastrointestinal tract (Personal history of other malignant neoplasm of rectum, rectosigmoid junction, and anus)  Procedures: [id number] - OFFICE OUTPATIENT NEW 20 MINUTES    Plan:  Scheduled for colonoscopy on 02/23/16.  Trilyte prescribed.   Patient Education:Alternative treatments to surgery were discussed with patient (and family).Risks and benefits  of procedure were fully explained to the patient (and family) who gave informed consent. Patient/family questions were addressed.  Follow-up:Pending Surgery

## 2016-02-23 ENCOUNTER — Encounter (HOSPITAL_COMMUNITY)
Admission: RE | Disposition: A | Discharge status: Home / Self Care | Payer: Self-pay | Source: Ambulatory Visit | Attending: General Surgery

## 2016-02-23 ENCOUNTER — Encounter (HOSPITAL_COMMUNITY): Payer: Self-pay | Admitting: *Deleted

## 2016-02-23 ENCOUNTER — Ambulatory Visit (HOSPITAL_COMMUNITY)
Admission: RE | Admit: 2016-02-23 | Discharge: 2016-02-23 | Disposition: A | Discharge status: Home / Self Care | Payer: Managed Care, Other (non HMO) | Source: Ambulatory Visit | Attending: General Surgery | Admitting: General Surgery

## 2016-02-23 DIAGNOSIS — Z85038 Personal history of other malignant neoplasm of large intestine: Secondary | ICD-10-CM | POA: Insufficient documentation

## 2016-02-23 DIAGNOSIS — Z1211 Encounter for screening for malignant neoplasm of colon: Secondary | ICD-10-CM | POA: Diagnosis present

## 2016-02-23 HISTORY — PX: COLONOSCOPY: SHX5424

## 2016-02-23 SURGERY — COLONOSCOPY
Anesthesia: Moderate Sedation

## 2016-02-23 MED ORDER — MEPERIDINE HCL 50 MG/ML IJ SOLN
INTRAMUSCULAR | Status: AC
  Filled 2016-02-23: qty 1

## 2016-02-23 MED ORDER — STERILE WATER FOR IRRIGATION IR SOLN
Status: DC | PRN
  Administered 2016-02-23: 08:00:00

## 2016-02-23 MED ORDER — SODIUM CHLORIDE 0.9 % IV SOLN
INTRAVENOUS | Status: DC
  Administered 2016-02-23: 07:00:00 via INTRAVENOUS

## 2016-02-23 MED ORDER — MEPERIDINE HCL 50 MG/ML IJ SOLN
INTRAMUSCULAR | Status: DC | PRN
  Administered 2016-02-23: 50 mg via INTRAVENOUS

## 2016-02-23 MED ORDER — MIDAZOLAM HCL 5 MG/5ML IJ SOLN
INTRAMUSCULAR | Status: AC
  Filled 2016-02-23: qty 5

## 2016-02-23 MED ORDER — MIDAZOLAM HCL 5 MG/5ML IJ SOLN
INTRAMUSCULAR | Status: DC | PRN
  Administered 2016-02-23: 1 mg via INTRAVENOUS
  Administered 2016-02-23: 3 mg via INTRAVENOUS

## 2016-02-23 NOTE — Interval H&P Note (Signed)
History and Physical Interval Note:  02/23/2016 7:31 AM  Kristin [name]  has presented today for surgery, with the diagnosis of personal history of colon cancer  The various methods of treatment have been discussed with the patient and family. After consideration of risks, benefits and other options for treatment, the patient has consented to  Procedure(s): COLONOSCOPY (N/A) as a surgical intervention .  The patient's history has been reviewed, patient examined, no change in status, stable for surgery.  I have reviewed the patient's chart and labs.  Questions were answered to the patient's satisfaction.     Aviva Signs A

## 2016-02-23 NOTE — Op Note (Signed)
[facility] Patient Name: Kristin [name] Procedure Date: 02/23/2016 7:11 AM MRN: [id number] Date of Birth: 1956-01-05 Attending MD: Aviva Signs , MD CSN: [id number] Age: 61 Admit Type: Outpatient Procedure:                Colonoscopy Indications:              High risk colon cancer surveillance: Personal                            history of colon cancer Providers:                Aviva Signs, MD, Otis Peak B. Sharon Seller, RN, Hinton Rao, RN Referring MD:              Medicines:                Midazolam 4 mg IV, Meperidine 50 mg IV Complications:            No immediate complications. Estimated blood loss:                            None. Estimated Blood Loss:     Estimated blood loss: none. Procedure:                Pre-Anesthesia Assessment:                           - Prior to the procedure, a History and Physical                            was performed, and patient medications and                            allergies were reviewed. The patient is competent.                            The risks and benefits of the procedure and the                            sedation options and risks were discussed with the                            patient. All questions were answered and informed                            consent was obtained. Patient identification and                            proposed procedure were verified by the physician                            and the nurse in the pre-procedure area in the  endoscopy suite. Mental Status Examination: alert                            and oriented. Airway Examination: normal                            oropharyngeal airway and neck mobility. Respiratory                            Examination: clear to auscultation. CV Examination:                            RRR, no murmurs, no S3 or S4. Prophylactic                            Antibiotics: The patient does not require                  prophylactic antibiotics. Prior Anticoagulants: The                            patient has taken no previous anticoagulant or                            antiplatelet agents. ASA Grade Assessment: II - A                            patient with mild systemic disease. After reviewing                            the risks and benefits, the patient was deemed in                            satisfactory condition to undergo the procedure.                            The anesthesia plan was to use moderate sedation /                            analgesia (conscious sedation). Immediately prior                            to administration of medications, the patient was                            re-assessed for adequacy to receive sedatives. The                            heart rate, respiratory rate, oxygen saturations,                            blood pressure, adequacy of pulmonary ventilation,                            and response to care were  monitored throughout the                            procedure. The physical status of the patient was                            re-assessed after the procedure.                           After obtaining informed consent, the colonoscope                            was passed under direct vision. Throughout the                            procedure, the patient's blood pressure, pulse, and                            oxygen saturations were monitored continuously. The                            [id number] scope was introduced through                            the anus and advanced to the the ileocolonic                            anastomosis. No anatomical landmarks were                            photographed. The entire colon was well visualized.                            The colonoscopy was performed without difficulty.                            The patient tolerated the procedure well. The                            quality of  the bowel preparation was adequate. The                            total duration of the procedure was 1 hour 9                            minutes. Scope In: [id number] AM Scope Out: 7:43:44 AM Total Procedure Duration: 0 hours 6 minutes 50 seconds  Findings:      The entire examined colon appeared normal. Anastomosis widely patent. Impression:               - The entire examined colon is normal. Anastomosis                            widely patent.                           -  No specimens collected. Moderate Sedation:      Moderate (conscious) sedation was administered by the endoscopy nurse       and supervised by the endoscopist. The following parameters were       monitored: oxygen saturation, heart rate, blood pressure, and response       to care. Total physician intraservice time was 9 minutes. Recommendation:           - Patient has a contact number available for                            emergencies. The signs and symptoms of potential                            delayed complications were discussed with the                            patient. Return to normal activities tomorrow.                            Written discharge instructions were provided to the                            patient.                           - Written discharge instructions were provided to                            the patient.                           - The signs and symptoms of potential delayed                            complications were discussed with the patient.                           - Patient has a contact number available for                            emergencies.                           - Return to normal activities tomorrow.                           - Resume previous diet.                           - Continue present medications.                           - Repeat colonoscopy in 5 years for surveillance. Procedure Code(s):        --- Professional ---  G0105, Colorectal cancer screening; colonoscopy on                            individual at high risk Diagnosis Code(s):        --- Professional ---                           [id number], Personal history of other malignant                            neoplasm of large intestine CPT copyright 2016 American Medical Association. All rights reserved. The codes documented in this report are preliminary and upon coder review may  be revised to meet current compliance requirements. Aviva Signs, MD Aviva Signs, MD 02/23/2016 7:48:59 AM This report has been signed electronically. Number of Addenda: 0

## 2016-02-23 NOTE — Discharge Instructions (Signed)
Colonoscopy, Adult, Care After  This sheet gives you information about how to care for yourself after your procedure. Your health care provider may also give you more specific instructions. If you have problems or questions, contact your health care provider.  What can I expect after the procedure?  After the procedure, it is common to have:  · A small amount of blood in your stool for 24 hours after the procedure.  · Some gas.  · Mild abdominal cramping or bloating.    Follow these instructions at home:  General instructions     · For the first 24 hours after the procedure:  ? Do not drive or use machinery.  ? Do not sign important documents.  ? Do not drink alcohol.  ? Do your regular daily activities at a slower pace than normal.  ? Eat soft, easy-to-digest foods.  ? Rest often.  · Take over-the-counter or prescription medicines only as told by your health care provider.  · It is up to you to get the results of your procedure. Ask your health care provider, or the department performing the procedure, when your results will be ready.  Relieving cramping and bloating   · Try walking around when you have cramps or feel bloated.  · Apply heat to your abdomen as told by your health care provider. Use a heat source that your health care provider recommends, such as a moist heat pack or a heating pad.  ? Place a towel between your skin and the heat source.  ? Leave the heat on for 20-30 minutes.  ? Remove the heat if your skin turns bright red. This is especially important if you are unable to feel pain, heat, or cold. You may have a greater risk of getting burned.  Eating and drinking   · Drink enough fluid to keep your urine clear or pale yellow.  · Resume your normal diet as instructed by your health care provider. Avoid heavy or fried foods that are hard to digest.  · Avoid drinking alcohol for as long as instructed by your health care provider.  Contact a health care provider if:  · You have blood in your stool 2-3  days after the procedure.  Get help right away if:  · You have more than a small spotting of blood in your stool.  · You pass large blood clots in your stool.  · Your abdomen is swollen.  · You have nausea or vomiting.  · You have a fever.  · You have increasing abdominal pain that is not relieved with medicine.  This information is not intended to replace advice given to you by your health care provider. Make sure you discuss any questions you have with your health care provider.  Document Released: 08/18/2003 Document Revised: 09/28/2015 Document Reviewed: 03/17/2015  Elsevier Interactive Patient Education © 2017 [employer] Inc[employer]

## 2016-02-29 ENCOUNTER — Encounter (HOSPITAL_COMMUNITY): Payer: Self-pay | Admitting: General Surgery

## 2016-03-09 ENCOUNTER — Ambulatory Visit: Payer: 59 | Admitting: Nurse Practitioner

## 2016-03-11 ENCOUNTER — Ambulatory Visit (HOSPITAL_COMMUNITY): Payer: Managed Care, Other (non HMO)

## 2016-03-14 ENCOUNTER — Ambulatory Visit (HOSPITAL_COMMUNITY)
Admission: RE | Admit: 2016-03-14 | Discharge: 2016-03-14 | Disposition: A | Discharge status: Home / Self Care | Payer: Managed Care, Other (non HMO) | Source: Ambulatory Visit | Attending: Oncology | Admitting: Oncology

## 2016-03-14 DIAGNOSIS — Z1231 Encounter for screening mammogram for malignant neoplasm of breast: Secondary | ICD-10-CM | POA: Insufficient documentation

## 2016-03-14 DIAGNOSIS — Z0000 Encounter for general adult medical examination without abnormal findings: Secondary | ICD-10-CM

## 2016-03-16 ENCOUNTER — Encounter: Payer: 59 | Admitting: Nurse Practitioner

## 2016-03-17 ENCOUNTER — Ambulatory Visit (INDEPENDENT_AMBULATORY_CARE_PROVIDER_SITE_OTHER): Payer: 59 | Admitting: Nurse Practitioner

## 2016-03-17 ENCOUNTER — Encounter: Payer: Self-pay | Admitting: Nurse Practitioner

## 2016-03-17 VITALS — BP 126/82 | Ht 67.25 in | Wt 164.0 lb

## 2016-03-17 DIAGNOSIS — Z0000 Encounter for general adult medical examination without abnormal findings: Secondary | ICD-10-CM | POA: Diagnosis not present

## 2016-03-17 DIAGNOSIS — Z780 Asymptomatic menopausal state: Secondary | ICD-10-CM | POA: Diagnosis not present

## 2016-03-17 DIAGNOSIS — Z13820 Encounter for screening for osteoporosis: Secondary | ICD-10-CM

## 2016-03-17 NOTE — Progress Notes (Signed)
   Subjective:    Patient ID: Kristin [name], female    DOB: 06/08/1955, 61 y.o.   MRN: [id number]  HPI  61 yo seen today for routine wellness exam.  Wears glasses and gets routine eye exams.  Has not seen dentist for several years, but will try to make an appt.  Last mammogram and colonoscopy done this month. Has hx of colon cancer and has been cleared to push back next colonoscopy to five years.  Last PAP completed 03/16/2015 and no new sexual partners. Stays active by walking throughout the day and has had recent expected 5lb weight for worsening dietary choices.     Review of Systems  Constitutional: Negative for activity change, appetite change, fatigue and unexpected weight change.  HENT: Negative for dental problem, ear pain, sinus pressure and sore throat.   Respiratory: Negative for cough, chest tightness, shortness of breath and wheezing.   Cardiovascular: Positive for leg swelling. Negative for chest pain and palpitations.       Leg swelling from Varicose veins, wears TED stockings, has not seen a specialist   Gastrointestinal: Negative for abdominal distention, abdominal pain, blood in stool, constipation, diarrhea, nausea and vomiting.  Genitourinary: Negative for difficulty urinating, dysuria, enuresis, frequency, genital sores, pelvic pain, urgency, vaginal bleeding, vaginal discharge and vaginal pain.  Skin: Negative for color change and rash.  Neurological: Negative for weakness and light-headedness.       Objective:   Physical Exam  Constitutional: She is oriented to person, place, and time. She appears well-developed and well-nourished. No distress.  HENT:  Right Ear: External ear normal.  Left Ear: External ear normal.  Mouth/Throat: Oropharynx is clear and moist.  Neck: Normal range of motion. Neck supple. No tracheal deviation present. No thyromegaly present.  Cardiovascular: Normal rate, regular rhythm and normal heart sounds.  Exam reveals no gallop.   No murmur  heard. Pulmonary/Chest: Effort normal and breath sounds normal.  Abdominal: Soft. She exhibits no distension. There is no tenderness.  Genitourinary: Vagina normal and uterus normal. No vaginal discharge found.  Genitourinary Comments: External: no rashes or lesions Internal: no discharge and pale  Bimanual:  No obvious masses palpable, no CMT  Musculoskeletal: She exhibits no edema.  Lymphadenopathy:    She has no cervical adenopathy.  Neurological: She is alert and oriented to person, place, and time.  Skin: Skin is warm and dry. No rash noted.  Psychiatric: She has a normal mood and affect. Her behavior is normal.  Vitals reviewed. Breasts:  Symmetrical, no nipple discharge, no obvious masses palpable, no axillary lymphadenopathy       Assessment & Plan:  Routine general medical examination at health care facility  Menopause - Plan: DG Bone Density  Screening for osteoporosis - Plan: DG Bone Density  Prescription and education for Shingrix provided.  Can get done at pharmacy.  Reviewed previous labs, no new medication changes at this time.  Recommend routine dental exams  Discussed lifestyle changes including maintaining or increasing activity level and improved dietary habits.   Return in about 6 months (around 09/17/2016).

## 2016-03-23 ENCOUNTER — Ambulatory Visit (HOSPITAL_COMMUNITY)
Admission: RE | Admit: 2016-03-23 | Discharge: 2016-03-23 | Disposition: A | Discharge status: Home / Self Care | Payer: 59 | Source: Ambulatory Visit | Attending: Nurse Practitioner | Admitting: Nurse Practitioner

## 2016-03-23 DIAGNOSIS — Z13820 Encounter for screening for osteoporosis: Secondary | ICD-10-CM | POA: Diagnosis present

## 2016-03-23 DIAGNOSIS — Z780 Asymptomatic menopausal state: Secondary | ICD-10-CM | POA: Insufficient documentation

## 2016-03-23 DIAGNOSIS — M8589 Other specified disorders of bone density and structure, multiple sites: Secondary | ICD-10-CM | POA: Diagnosis not present

## 2016-04-01 ENCOUNTER — Other Ambulatory Visit: Payer: Self-pay | Admitting: Nurse Practitioner

## 2016-04-06 ENCOUNTER — Encounter: Payer: Self-pay | Admitting: Nurse Practitioner

## 2016-04-06 DIAGNOSIS — M8580 Other specified disorders of bone density and structure, unspecified site: Secondary | ICD-10-CM | POA: Insufficient documentation

## 2016-10-22 ENCOUNTER — Other Ambulatory Visit: Payer: Self-pay | Admitting: Nurse Practitioner

## 2016-11-12 ENCOUNTER — Other Ambulatory Visit: Payer: Self-pay | Admitting: Nurse Practitioner

## 2016-11-24 ENCOUNTER — Other Ambulatory Visit: Payer: Self-pay | Admitting: Surgery

## 2016-11-24 DIAGNOSIS — E210 Primary hyperparathyroidism: Secondary | ICD-10-CM

## 2016-11-29 ENCOUNTER — Ambulatory Visit
Admission: RE | Admit: 2016-11-29 | Discharge: 2016-11-29 | Disposition: A | Discharge status: Home / Self Care | Payer: 59 | Source: Ambulatory Visit | Attending: Surgery | Admitting: Surgery

## 2016-11-29 DIAGNOSIS — E210 Primary hyperparathyroidism: Secondary | ICD-10-CM

## 2016-11-29 MED ORDER — IOPAMIDOL (ISOVUE-300) INJECTION 61%
75.0000 mL | Freq: Once | INTRAVENOUS | Status: AC | PRN
  Administered 2016-11-29: 75 mL via INTRAVENOUS

## 2016-11-30 ENCOUNTER — Ambulatory Visit: Payer: Self-pay | Admitting: Surgery

## 2017-01-05 ENCOUNTER — Other Ambulatory Visit (HOSPITAL_COMMUNITY): Payer: Managed Care, Other (non HMO)

## 2017-01-05 ENCOUNTER — Encounter (HOSPITAL_COMMUNITY): Payer: Self-pay

## 2017-01-05 ENCOUNTER — Other Ambulatory Visit: Payer: Self-pay

## 2017-01-05 ENCOUNTER — Encounter (HOSPITAL_COMMUNITY): Payer: 59 | Attending: Oncology | Admitting: Oncology

## 2017-01-05 VITALS — BP 145/88 | HR 62 | Temp 98.0°F | Resp 16 | Ht 66.5 in | Wt 159.0 lb

## 2017-01-05 DIAGNOSIS — Z85038 Personal history of other malignant neoplasm of large intestine: Secondary | ICD-10-CM | POA: Diagnosis not present

## 2017-01-05 DIAGNOSIS — C189 Malignant neoplasm of colon, unspecified: Secondary | ICD-10-CM

## 2017-01-05 NOTE — Progress Notes (Signed)
Kristin Kirschner, MD [address] [place] [id number]  No diagnosis found.  CURRENT THERAPY: Surveillance per NCCN guidelines  INTERVAL HISTORY: Kristin [name] 61 y.o. 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 Aviva Signs on 09/13/2006 with the finding of 28 benign lymph nodes. No LV I was seen and she was not given adjuvant chemotherapy.      Adenocarcinoma of colon ([place])   09/13/2006 Pathology Results    Right segmental resection by Dr. Arnoldo Morale      09/13/2006 Definitive Surgery    COLON, RIGHT, SEGMENTAL RESECTION: COLONIC ADENOCARCINOMA FOCALLY INVADING THROUGH MUSCULARIS PROPRIA, MARGINS NOT INVOLVED. TWENTY-EIGHT BENIGN LYMPH NODES (0/28).        Patient presents today for continued follow-up of her colon cancer.  She has no complaints.  She states she is feeling well.  He had a repeat colonoscopy in February 2018 which was normal with widely patent anastomosis.  She denies any changes to her bowel habits or any bleeding including melena, hematochezia.  She also denies any hematuria, hemoptysis.  She denies any chest pain, shortness of breath, abdominal pain, nausea, vomiting, diarrhea, focal weakness.  She has not had any recent infections.  She denies having any new health issues since last time we saw her.  Review of Systems  Constitutional: Negative.  Negative for chills, fever, malaise/fatigue and weight loss.  HENT: Negative.   Eyes: Negative.   Respiratory: Negative.  Negative for cough.   Cardiovascular: Negative for chest pain and leg swelling.  Gastrointestinal: Negative.  Negative for abdominal pain, blood in stool, constipation, diarrhea and melena.  Genitourinary: Negative.  Negative for dysuria.  Musculoskeletal: Negative.   Skin: Negative.   Neurological: Negative.  Negative for weakness.  Endo/Heme/Allergies: Negative.     Psychiatric/Behavioral: The patient is not nervous/anxious.     Past Medical History:  Diagnosis Date  . Adenocarcinoma of colon ([place]) 2008  . Anemia   . 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 Complication of anesthesia    "need childsized tube to go down my throat/dr in 2008" (05/14/2012)  . Hyperlipidemia   . Hyperparathyroidism ([place]) 01/2012  . Hypertension 05/14/2012   "starting today" (05/14/2012)  . Lyme disease 2010    Past Surgical History:  Procedure Laterality Date  . COLON RESECTION  2008  . COLON SURGERY    . COLONOSCOPY  10/12/2010   Procedure: COLONOSCOPY;  Surgeon: Jamesetta So;  [facility];  Service: Gastroenterology;  Laterality: N/A;  . COLONOSCOPY N/A 02/23/2016   Procedure: COLONOSCOPY;  Surgeon: Aviva Signs, MD;  Location: [facility];  Service: Gastroenterology;  Laterality: N/A;  . FACIAL COSMETIC SURGERY     eyelids  . WISDOM TOOTH EXTRACTION  2000's   "2" (05/14/2012)    Family History  Problem Relation Age of Onset  . Cancer Maternal Grandfather   . Hypertension Mother   . Heart attack Father     Social History   Socioeconomic History  . Marital status: Married    Spouse name: None  . Number of children: None  . Years of education: None  . Highest education level: None  Social Needs  . Financial resource strain: None  . Food insecurity - worry: None  . Food insecurity - inability: None  . Transportation needs - medical: None  . Transportation needs - non-medical: None  Occupational History  .  None  Tobacco Use  . Smoking status: Never Smoker  . Smokeless tobacco: Never Used  Substance and Sexual Activity  . Alcohol use: No  . Drug use: No  . Sexual activity: Yes  Other Topics Concern  . None  Social History Narrative  . None     PHYSICAL EXAMINATION  ECOG PERFORMANCE STATUS: 0 - Asymptomatic  Vitals:   01/05/17 0939  BP: (!) 145/88  Pulse: 62  Resp: 16  Temp: 98 F (36.7 C)  SpO2: 100%   Constitutional: Well-developed,  well-nourished, and in no distress.   HENT:  Head: Normocephalic and atraumatic.  Mouth/Throat: No oropharyngeal exudate. Mucosa moist. Eyes: Pupils are equal, round, and reactive to light. Conjunctivae are normal. No scleral icterus.  Neck: Normal range of motion. Neck supple. No JVD present.  Cardiovascular: Normal rate, regular rhythm and normal heart sounds.  Exam reveals no gallop and no friction rub.   No murmur heard. Pulmonary/Chest: Effort normal and breath sounds normal. No respiratory distress. No wheezes.No rales.  Abdominal: Soft. Bowel sounds are normal. No distension. There is no tenderness. There is no guarding.  Musculoskeletal: No edema or tenderness.  Lymphadenopathy:    No cervical or supraclavicular adenopathy.  Neurological: Alert and oriented to person, place, and time. No cranial nerve deficit.  Skin: Skin is warm and dry. No rash noted. No erythema. No pallor.  Psychiatric: Affect and judgment normal.     LABORATORY DATA: CBC    Component Value Date/Time   WBC 6.0 01/08/2016 1018   RBC 4.84 01/08/2016 1018   HGB 14.3 01/08/2016 1018   HCT 44.4 01/08/2016 1018   PLT 211 01/08/2016 1018   MCV 91.7 01/08/2016 1018   MCH 29.5 01/08/2016 1018   MCHC 32.2 01/08/2016 1018   RDW 14.0 01/08/2016 1018   LYMPHSABS 1.0 01/08/2016 1018   MONOABS 0.5 01/08/2016 1018   EOSABS 0.1 01/08/2016 1018   BASOSABS 0.1 01/08/2016 1018      Chemistry      Component Value Date/Time   NA 137 01/08/2016 1018   K 4.2 01/08/2016 1018   CL 104 01/08/2016 1018   CO2 29 01/08/2016 1018   BUN 14 01/08/2016 1018   CREATININE 0.77 01/08/2016 1018      Component Value Date/Time   CALCIUM 10.2 01/08/2016 1018   ALKPHOS 52 01/08/2016 1018   AST 24 01/08/2016 1018   ALT 20 01/08/2016 1018   BILITOT 0.5 01/08/2016 1018        PENDING LABS:   RADIOGRAPHIC STUDIES:  No results found.   PATHOLOGY:    ASSESSMENT AND PLAN:  Stage IIA (T3, N0, M0) mildly  well-differentiated adenocarcinoma of the cecum status post surgical resection by Dr. Aviva Signs on 09/13/2006 with the finding of 28 benign lymph nodes. No LV I was seen and she was not given adjuvant chemotherapy.    -Clinically NED. -Recently had colonoscopy in 02/2016 which was normal. -Patient is now 10 years out from her original diagnosis and treatment. No further follow up is necessary after today. -RTC PRN.    THERAPY PLAN:  NCCN guidelines for surveillance for Colon cancer are as follows (1.2017):  A. Stage I   1. Colonoscopy at year 1    A. If advanced adenoma, repeat in 1 year    B. If no advanced adenoma, repeat in 3 years, and then every 5 years.  B. Stage II, Stage III   1. H+P every 3-6 months x 2 years and then  every 6 months for a total of 5 years    2. CEA every 3-6 months x 2 years and then every 6 months for a total of 5 years    3. CT CAP every 6-12 months (category 2B for frequency < 12 months) for a total of 5 years .   4.  Colonoscopy in 1 year except if no preoperative colonoscopy due to obstructing lesion, colonoscopy in 3-6 months.     A. If advanced adenoma, repeat in 1 year    B. If no advanced adenoma, repeat in 3 years, then every 5 years   5. PET/CT scan is not recommended.  C. Stage IV   1. H+P every 3-6 months x 2 years and then every 6 months for a total of 5 years    2. CEA every 3 months x 2 years and then every 6 months for a total of 3- 5 years    3. CT CAP every 3-6 months (category 2B for frequency < 6 months) x 2 years., then every 6-12 months for a total of 5 years .   4. Colonoscopy in 1 year except if no preoperative colonoscopy due to obstructing lesion, colonoscopy in 3-6 months.     A. If advanced adenoma, repeat in 1 year    B. If no advanced adenoma, repeat in 3 years, then every 5 years   All questions were answered. The patient knows to call the clinic with any problems, questions or concerns. We can certainly see the patient much  sooner if necessary.   This note is electronically signed by: Twana First, MD 01/05/2017 3:37 PM

## 2017-01-06 ENCOUNTER — Ambulatory Visit (HOSPITAL_COMMUNITY): Payer: Managed Care, Other (non HMO) | Admitting: Oncology

## 2017-01-12 ENCOUNTER — Other Ambulatory Visit: Payer: Self-pay | Admitting: Family Medicine

## 2017-03-02 ENCOUNTER — Ambulatory Visit: Payer: Self-pay | Admitting: Surgery

## 2017-03-21 NOTE — Patient Instructions (Signed)
GABRYELLA MURFIN  03/21/2017   Your procedure is scheduled on:   Report to [facility] Main  Entrance  Report to admitting at   0730 AM   Call this number if you have problems the morning of surgery [phone]   Remember: Do not eat food or drink liquids :After Midnight.     Take these medicines the morning of surgery with A SIP OF WATER: eye drops if needed                                 You may not have any metal on your body including hair pins and              piercings  Do not wear jewelry, make-up, lotions, powders or perfumes, deodorant             Do not wear nail polish.  Do not shave  48 hours prior to surgery.                Do not bring valuables to the hospital. [place].  Contacts, dentures or bridgework may not be worn into surgery. .     Patients discharged the day of surgery will not be allowed to drive home.  Name and phone number of your driver:               Please read over the following fact sheets you were given: _____________________________________________________________________             [facility] - Preparing for Surgery Before surgery, you can play an important role.  Because skin is not sterile, your skin needs to be as free of germs as possible.  You can reduce the number of germs on your skin by washing with CHG (chlorahexidine gluconate) soap before surgery.  CHG is an antiseptic cleaner which kills germs and bonds with the skin to continue killing germs even after washing. Please DO NOT use if you have an allergy to CHG or antibacterial soaps.  If your skin becomes reddened/irritated stop using the CHG and inform your nurse when you arrive at Short Stay. Do not shave (including legs and underarms) for at least 48 hours prior to the first CHG shower.  You may shave your face/neck. Please follow these instructions carefully:  1.  Shower with CHG Soap the night before  surgery and the  morning of Surgery.  2.  If you choose to wash your hair, wash your hair first as usual with your  normal  shampoo.  3.  After you shampoo, rinse your hair and body thoroughly to remove the  shampoo.                           4.  Use CHG as you would any other liquid soap.  You can apply chg directly  to the skin and wash                       Gently with a scrungie or clean washcloth.  5.  Apply the CHG Soap to your body ONLY FROM THE NECK DOWN.   Do not use on face/ open  Wound or open sores. Avoid contact with eyes, ears mouth and genitals (private parts).                       Wash face,  Genitals (private parts) with your normal soap.             6.  Wash thoroughly, paying special attention to the area where your surgery  will be performed.  7.  Thoroughly rinse your body with warm water from the neck down.  8.  DO NOT shower/wash with your normal soap after using and rinsing off  the CHG Soap.                9.  Pat yourself dry with a clean towel.            10.  Wear clean pajamas.            11.  Place clean sheets on your bed the night of your first shower and do not  sleep with pets. Day of Surgery : Do not apply any lotions/deodorants the morning of surgery.  Please wear clean clothes to the hospital/surgery center.  FAILURE TO FOLLOW THESE INSTRUCTIONS MAY RESULT IN THE CANCELLATION OF YOUR SURGERY PATIENT SIGNATURE_________________________________  NURSE SIGNATURE__________________________________  ________________________________________________________________________

## 2017-03-24 ENCOUNTER — Other Ambulatory Visit: Payer: Self-pay

## 2017-03-24 ENCOUNTER — Encounter (HOSPITAL_COMMUNITY)
Admission: RE | Admit: 2017-03-24 | Discharge: 2017-03-24 | Disposition: A | Discharge status: Home / Self Care | Payer: 59 | Source: Ambulatory Visit | Attending: Surgery | Admitting: Surgery

## 2017-03-24 ENCOUNTER — Encounter (HOSPITAL_COMMUNITY): Payer: Self-pay

## 2017-03-24 ENCOUNTER — Ambulatory Visit (HOSPITAL_COMMUNITY)
Admission: RE | Admit: 2017-03-24 | Discharge: 2017-03-24 | Disposition: A | Discharge status: Home / Self Care | Payer: 59 | Source: Ambulatory Visit | Attending: Anesthesiology | Admitting: Anesthesiology

## 2017-03-24 DIAGNOSIS — Z01818 Encounter for other preprocedural examination: Secondary | ICD-10-CM

## 2017-03-24 DIAGNOSIS — R9431 Abnormal electrocardiogram [ECG] [EKG]: Secondary | ICD-10-CM | POA: Diagnosis not present

## 2017-03-24 DIAGNOSIS — Z01810 Encounter for preprocedural cardiovascular examination: Secondary | ICD-10-CM | POA: Diagnosis not present

## 2017-03-24 DIAGNOSIS — Z01812 Encounter for preprocedural laboratory examination: Secondary | ICD-10-CM | POA: Insufficient documentation

## 2017-03-24 DIAGNOSIS — E210 Primary hyperparathyroidism: Secondary | ICD-10-CM | POA: Diagnosis not present

## 2017-03-24 HISTORY — DX: Cerebral infarction, unspecified: I63.9

## 2017-03-24 HISTORY — DX: Peripheral vascular disease, unspecified: I73.9

## 2017-03-24 LAB — CBC
HCT: 42.9 % (ref 36.0–46.0)
Hemoglobin: 13.9 g/dL (ref 12.0–15.0)
MCH: 29.8 pg (ref 26.0–34.0)
MCHC: 32.4 g/dL (ref 30.0–36.0)
MCV: 91.9 fL (ref 78.0–100.0)
PLATELETS: 252 10*3/uL (ref 150–400)
RBC: 4.67 MIL/uL (ref 3.87–5.11)
RDW: 14.3 % (ref 11.5–15.5)
WBC: 6.3 10*3/uL (ref 4.0–10.5)

## 2017-03-24 LAB — BASIC METABOLIC PANEL
Anion gap: 7 (ref 5–15)
BUN: 15 mg/dL (ref 6–20)
CHLORIDE: 104 mmol/L (ref 101–111)
CO2: 27 mmol/L (ref 22–32)
CREATININE: 0.8 mg/dL (ref 0.44–1.00)
Calcium: 10.2 mg/dL (ref 8.9–10.3)
GFR calc non Af Amer: >60 mL/min (ref >60)
Glucose, Bld: 70 mg/dL (ref 65–99)
Potassium: 5.1 mmol/L (ref 3.5–5.1)
SODIUM: 138 mmol/L (ref 135–145)

## 2017-03-26 ENCOUNTER — Encounter (HOSPITAL_COMMUNITY): Payer: Self-pay | Admitting: Surgery

## 2017-03-26 NOTE — H&P (Signed)
General Surgery [facility]  Kristin [name] DOB: September 17, 1955 Married / Language: English / Race: White Female   History of Present Illness  The patient is a 62 year old female who presents with primary hyperparathyroidism.  CC: primary hyperparathyroidism  Patient returns for scheduled follow-up of primary hyperparathyroidism. Patient has been followed for at least 4 years for laboratory values worrisome for primary hyperparathyroidism. Patient's most recent studies show an elevated calcium level of 10.7, elevated from last year's level of 9.8. Intact PTH level is in the upper range of normal at 60, down from last year's elevated level of 76. 25-hydroxy vitamin D level remains normal at 45. Patient underwent a nuclear medicine bone density scan in March 2018. This showed osteopenia but there was evidence of progressive bone loss in the right femoral neck. Patient has also noted some weakness in bilateral lower extremities and some left knee pain. She denies nephrolithiasis. She denies any recent fractures. Energy level is good.   Allergies Keflex *CEPHALOSPORINS*  Hives. Allergies Reconciled   Medication History Ergocalciferol (50000UNIT Capsule, 1 (one) Capsule Oral weekly, Taken starting 05/20/2014) Active. Labetalol HCl (100MG  Tablet, Oral daily) Active. Betamethasone Dipropionate (0.05% Cream, External) Active. Citalopram Hydrobromide (20MG  Tablet, Oral) Active. Vitamin D (2000UNIT Tablet, Oral) Active. Medications Reconciled  Vitals Weight: 163 lb Height: 66in Body Surface Area: 1.83 m Body Mass Index: 26.31 kg/m  Temp.: 98.29F  Pulse: 76 (Regular)  BP: 128/84 (Sitting, Left Arm, Standard)   Physical Exam  See vital signs recorded above  GENERAL APPEARANCE Development: normal Nutritional status: normal Gross deformities: none  SKIN Rash, lesions, ulcers: none Induration, erythema: none Nodules: none  palpable  EYES Conjunctiva and lids: normal Pupils: equal and reactive Iris: normal bilaterally  EARS, NOSE, MOUTH, THROAT External ears: no lesion or deformity External nose: no lesion or deformity Hearing: grossly normal Lips: no lesion or deformity Dentition: normal for age Oral mucosa: moist  NECK Symmetric: yes Trachea: midline Thyroid: Possible subtle left inferior thyroid nodule, 1-1.5 cm, soft, mobile, nontender; no other palpable abnormality in either the left or right thyroid lobes.  CHEST Respiratory effort: normal Retraction or accessory muscle use: no Breath sounds: normal bilaterally Rales, rhonchi, wheeze: none  CARDIOVASCULAR Auscultation: regular rhythm, normal rate Murmurs: none Pulses: carotid and radial pulse 2+ palpable Lower extremity edema: none Lower extremity varicosities: none  MUSCULOSKELETAL Station and gait: normal Digits and nails: no clubbing or cyanosis Muscle strength: grossly normal all extremities Range of motion: grossly normal all extremities Deformity: none  LYMPHATIC Cervical: none palpable Supraclavicular: none palpable  PSYCHIATRIC Oriented to person, place, and time: yes Mood and affect: normal for situation Judgment and insight: appropriate for situation    Assessment & Plan  PRIMARY HYPERPARATHYROIDISM (E21.0)  Follow Up - Call [facility] office after tests / studies doneto discuss further plans  Patient returns for annual follow-up. Laboratory studies continued to be somewhat confounding with her calcium level rising in her PTH level decreasing. However, laboratory studies continue to point towards primary hyperparathyroidism. Patient has developed some mild symptoms with weakness in the lower extremities and left knee pain. Bone density scanning shows some bone loss with underlying osteopenia.  I have recommended proceeding with a 4D CT scan of the neck using parathyroid protocol. Possibly this will localize a  parathyroid adenoma. If so, the patient will be a good candidate for minimally invasive outpatient surgery. If the CT scan is unrevealing, we will continue to monitor her for another year.  ADDENDUM:  4D-CT scan shows right sided parathyroid adenoma.  Plan to proceed with parathyroidectomy.  The risks and benefits of the procedure have been discussed at length with the patient.  The patient understands the proposed procedure, potential alternative treatments, and the course of recovery to be expected.  All of the patient's questions have been answered at this time.  The patient wishes to proceed with surgery.  Armandina Gemma, [facility] Office: [phone]

## 2017-03-27 NOTE — Progress Notes (Signed)
Final EKg done 03/24/2017-epic

## 2017-03-29 NOTE — Anesthesia Preprocedure Evaluation (Addendum)
Anesthesia Evaluation  Patient identified by MRN, date of birth, ID band Patient awake  General Assessment Comment: Complication of anesthesia  "need childsized tube to go down my throat/dr in 2008" (05/14/2012)   Reviewed: Allergy & Precautions, NPO status , Patient's Chart, lab work & pertinent test results  History of Anesthesia Complications (+) history of anesthetic complications  Airway Mallampati: II  TM Distance: <3 FB Neck ROM: Full    Dental no notable dental hx. (+) Missing   Pulmonary neg pulmonary ROS,    Pulmonary exam normal breath sounds clear to auscultation       Cardiovascular hypertension, Normal cardiovascular exam Rhythm:Regular Rate:Normal     Neuro/Psych TIACVA negative psych ROS   GI/Hepatic negative GI ROS, Neg liver ROS,   Endo/Other  negative endocrine ROS  Renal/GU negative Renal ROS  negative genitourinary   Musculoskeletal negative musculoskeletal ROS (+)   Abdominal   Peds negative pediatric ROS (+)  Hematology negative hematology ROS (+)   Anesthesia Other Findings PRIMARY HYPERPARATHYROIDISM   Reproductive/Obstetrics negative OB ROS                            Anesthesia Physical Anesthesia Plan  ASA: III  Anesthesia Plan: General   Post-op Pain Management:    Induction: Intravenous  PONV Risk Score and Plan: 3 and Ondansetron, Dexamethasone, Treatment may vary due to age or medical condition and Midazolam  Airway Management Planned: Oral ETT, LMA and Video Laryngoscope Planned  Additional Equipment:   Intra-op Plan:   Post-operative Plan: Extubation in OR  Informed Consent: I have reviewed the patients History and Physical, chart, labs and discussed the procedure including the risks, benefits and alternatives for the proposed anesthesia with the patient or authorized representative who has indicated his/her understanding and acceptance.      Plan Discussed with: Anesthesiologist and CRNA  Anesthesia Plan Comments: (Smaller ET available  )       Anesthesia Quick Evaluation

## 2017-03-30 ENCOUNTER — Ambulatory Visit (HOSPITAL_COMMUNITY): Payer: No Typology Code available for payment source | Admitting: Anesthesiology

## 2017-03-30 ENCOUNTER — Encounter (HOSPITAL_COMMUNITY): Payer: Self-pay | Admitting: Emergency Medicine

## 2017-03-30 ENCOUNTER — Encounter (HOSPITAL_COMMUNITY)
Admission: RE | Disposition: A | Discharge status: Home / Self Care | Payer: Self-pay | Source: Ambulatory Visit | Attending: Surgery

## 2017-03-30 ENCOUNTER — Ambulatory Visit (HOSPITAL_COMMUNITY)
Admission: RE | Admit: 2017-03-30 | Discharge: 2017-03-30 | Disposition: A | Discharge status: Home / Self Care | Payer: No Typology Code available for payment source | Source: Ambulatory Visit | Attending: Surgery | Admitting: Surgery

## 2017-03-30 ENCOUNTER — Other Ambulatory Visit: Payer: Self-pay

## 2017-03-30 DIAGNOSIS — I10 Essential (primary) hypertension: Secondary | ICD-10-CM | POA: Insufficient documentation

## 2017-03-30 DIAGNOSIS — Z79899 Other long term (current) drug therapy: Secondary | ICD-10-CM | POA: Diagnosis not present

## 2017-03-30 DIAGNOSIS — D351 Benign neoplasm of parathyroid gland: Secondary | ICD-10-CM | POA: Insufficient documentation

## 2017-03-30 DIAGNOSIS — E210 Primary hyperparathyroidism: Secondary | ICD-10-CM | POA: Diagnosis not present

## 2017-03-30 DIAGNOSIS — E213 Hyperparathyroidism, unspecified: Secondary | ICD-10-CM | POA: Diagnosis present

## 2017-03-30 HISTORY — PX: PARATHYROIDECTOMY: SHX19

## 2017-03-30 SURGERY — PARATHYROIDECTOMY
Anesthesia: General | Laterality: Right

## 2017-03-30 MED ORDER — OXYCODONE HCL 5 MG PO TABS: 5.0000 mg | ORAL_TABLET | Freq: Four times a day (QID) | ORAL | 0 refills | Status: DC | PRN

## 2017-03-30 MED ORDER — ROCURONIUM BROMIDE 10 MG/ML (PF) SYRINGE
PREFILLED_SYRINGE | INTRAVENOUS | Status: DC | PRN
  Administered 2017-03-30: 40 mg via INTRAVENOUS

## 2017-03-30 MED ORDER — ACETAMINOPHEN 160 MG/5ML PO SOLN: 325.0000 mg | ORAL | Status: DC | PRN

## 2017-03-30 MED ORDER — ONDANSETRON HCL 4 MG/2ML IJ SOLN
INTRAMUSCULAR | Status: DC | PRN
  Administered 2017-03-30: 4 mg via INTRAVENOUS

## 2017-03-30 MED ORDER — OXYCODONE HCL 5 MG PO TABS: 5.0000 mg | ORAL_TABLET | Freq: Once | ORAL | Status: DC | PRN

## 2017-03-30 MED ORDER — LIDOCAINE 2% (20 MG/ML) 5 ML SYRINGE
INTRAMUSCULAR | Status: DC | PRN
  Administered 2017-03-30: 60 mg via INTRAVENOUS

## 2017-03-30 MED ORDER — FENTANYL CITRATE (PF) 250 MCG/5ML IJ SOLN
INTRAMUSCULAR | Status: AC
  Filled 2017-03-30: qty 5

## 2017-03-30 MED ORDER — CIPROFLOXACIN IN D5W 400 MG/200ML IV SOLN
400.0000 mg | INTRAVENOUS | Status: AC
  Administered 2017-03-30: 400 mg via INTRAVENOUS
  Filled 2017-03-30: qty 200

## 2017-03-30 MED ORDER — KETOROLAC TROMETHAMINE 30 MG/ML IJ SOLN
30.0000 mg | Freq: Once | INTRAMUSCULAR | Status: DC | PRN
  Administered 2017-03-30: 30 mg via INTRAVENOUS

## 2017-03-30 MED ORDER — 0.9 % SODIUM CHLORIDE (POUR BTL) OPTIME
TOPICAL | Status: DC | PRN
  Administered 2017-03-30: 1000 mL

## 2017-03-30 MED ORDER — PROPOFOL 10 MG/ML IV BOLUS
INTRAVENOUS | Status: AC
  Filled 2017-03-30: qty 20

## 2017-03-30 MED ORDER — EPHEDRINE SULFATE-NACL 50-0.9 MG/10ML-% IV SOSY
PREFILLED_SYRINGE | INTRAVENOUS | Status: DC | PRN
  Administered 2017-03-30: 10 mg via INTRAVENOUS
  Administered 2017-03-30: 15 mg via INTRAVENOUS

## 2017-03-30 MED ORDER — CHLORHEXIDINE GLUCONATE CLOTH 2 % EX PADS: 6.0000 | MEDICATED_PAD | Freq: Once | CUTANEOUS | Status: DC

## 2017-03-30 MED ORDER — MIDAZOLAM HCL 2 MG/2ML IJ SOLN
INTRAMUSCULAR | Status: AC
  Filled 2017-03-30: qty 2

## 2017-03-30 MED ORDER — SUGAMMADEX SODIUM 200 MG/2ML IV SOLN
INTRAVENOUS | Status: DC | PRN
  Administered 2017-03-30: 150 mg via INTRAVENOUS

## 2017-03-30 MED ORDER — MEPERIDINE HCL 50 MG/ML IJ SOLN: 6.2500 mg | INTRAMUSCULAR | Status: DC | PRN

## 2017-03-30 MED ORDER — ONDANSETRON HCL 4 MG/2ML IJ SOLN
INTRAMUSCULAR | Status: AC
  Filled 2017-03-30: qty 2

## 2017-03-30 MED ORDER — KETOROLAC TROMETHAMINE 30 MG/ML IJ SOLN
INTRAMUSCULAR | Status: AC
  Filled 2017-03-30: qty 1

## 2017-03-30 MED ORDER — FENTANYL CITRATE (PF) 100 MCG/2ML IJ SOLN
INTRAMUSCULAR | Status: AC
  Filled 2017-03-30: qty 2

## 2017-03-30 MED ORDER — LACTATED RINGERS IV SOLN
INTRAVENOUS | Status: DC
  Administered 2017-03-30 (×2): via INTRAVENOUS

## 2017-03-30 MED ORDER — FENTANYL CITRATE (PF) 100 MCG/2ML IJ SOLN
INTRAMUSCULAR | Status: DC | PRN
  Administered 2017-03-30 (×3): 50 ug via INTRAVENOUS
  Administered 2017-03-30: 100 ug via INTRAVENOUS

## 2017-03-30 MED ORDER — PROPOFOL 10 MG/ML IV BOLUS
INTRAVENOUS | Status: DC | PRN
  Administered 2017-03-30: 150 mg via INTRAVENOUS

## 2017-03-30 MED ORDER — SUCCINYLCHOLINE CHLORIDE 20 MG/ML IJ SOLN
INTRAMUSCULAR | Status: DC | PRN
  Administered 2017-03-30: 100 mg via INTRAVENOUS

## 2017-03-30 MED ORDER — OXYCODONE HCL 5 MG/5ML PO SOLN
5.0000 mg | Freq: Once | ORAL | Status: DC | PRN
  Filled 2017-03-30: qty 5

## 2017-03-30 MED ORDER — MIDAZOLAM HCL 5 MG/5ML IJ SOLN
INTRAMUSCULAR | Status: DC | PRN
  Administered 2017-03-30: 2 mg via INTRAVENOUS

## 2017-03-30 MED ORDER — BUPIVACAINE HCL (PF) 0.25 % IJ SOLN
INTRAMUSCULAR | Status: AC
  Filled 2017-03-30: qty 30

## 2017-03-30 MED ORDER — FENTANYL CITRATE (PF) 100 MCG/2ML IJ SOLN: 25.0000 ug | INTRAMUSCULAR | Status: DC | PRN

## 2017-03-30 MED ORDER — ACETAMINOPHEN 325 MG PO TABS: 325.0000 mg | ORAL_TABLET | ORAL | Status: DC | PRN

## 2017-03-30 MED ORDER — BUPIVACAINE HCL 0.25 % IJ SOLN
INTRAMUSCULAR | Status: DC | PRN
  Administered 2017-03-30: 10 mL

## 2017-03-30 MED ORDER — ONDANSETRON HCL 4 MG/2ML IJ SOLN: 4.0000 mg | Freq: Once | INTRAMUSCULAR | Status: DC | PRN

## 2017-03-30 MED ORDER — DEXAMETHASONE SODIUM PHOSPHATE 10 MG/ML IJ SOLN
INTRAMUSCULAR | Status: DC | PRN
  Administered 2017-03-30: 5 mg via INTRAVENOUS

## 2017-03-30 SURGICAL SUPPLY — 41 items
ADH SKN CLS APL DERMABOND .7 (GAUZE/BANDAGES/DRESSINGS)
ATTRACTOMAT 16X20 MAGNETIC DRP (DRAPES) ×3 IMPLANT
BLADE HEX COATED 2.75 (ELECTRODE) ×3 IMPLANT
BLADE SURG 15 STRL LF DISP TIS (BLADE) ×1 IMPLANT
BLADE SURG 15 STRL SS (BLADE) ×3
CHLORAPREP W/TINT 26ML (MISCELLANEOUS) ×3 IMPLANT
CLIP VESOCCLUDE MED 6/CT (CLIP) ×6 IMPLANT
CLIP VESOCCLUDE SM WIDE 6/CT (CLIP) ×6 IMPLANT
CLOSURE STERI-STRIP 1/4X4 (GAUZE/BANDAGES/DRESSINGS) ×2 IMPLANT
CLOSURE WOUND 1/2 X4 (GAUZE/BANDAGES/DRESSINGS)
DERMABOND ADVANCED (GAUZE/BANDAGES/DRESSINGS)
DERMABOND ADVANCED .7 DNX12 (GAUZE/BANDAGES/DRESSINGS) IMPLANT
DRAPE LAPAROTOMY T 98X78 PEDS (DRAPES) ×3 IMPLANT
ELECT PENCIL ROCKER SW 15FT (MISCELLANEOUS) ×3 IMPLANT
ELECT REM PT RETURN 15FT ADLT (MISCELLANEOUS) ×3 IMPLANT
GAUZE SPONGE 4X4 12PLY STRL (GAUZE/BANDAGES/DRESSINGS) ×3 IMPLANT
GAUZE SPONGE 4X4 16PLY XRAY LF (GAUZE/BANDAGES/DRESSINGS) ×3 IMPLANT
GLOVE SURG ORTHO 8.0 STRL STRW (GLOVE) ×3 IMPLANT
GOWN STRL REUS W/TWL XL LVL3 (GOWN DISPOSABLE) ×9 IMPLANT
HEMOSTAT SURGICEL 2X4 FIBR (HEMOSTASIS) ×2 IMPLANT
ILLUMINATOR WAVEGUIDE N/F (MISCELLANEOUS) ×2 IMPLANT
KIT BASIN OR (CUSTOM PROCEDURE TRAY) ×3 IMPLANT
LIGHT WAVEGUIDE WIDE FLAT (MISCELLANEOUS) IMPLANT
NDL HYPO 25X1 1.5 SAFETY (NEEDLE) ×1 IMPLANT
NEEDLE HYPO 25X1 1.5 SAFETY (NEEDLE) ×3 IMPLANT
PACK BASIC VI WITH GOWN DISP (CUSTOM PROCEDURE TRAY) ×3 IMPLANT
POWDER SURGICEL 3.0 GRAM (HEMOSTASIS) IMPLANT
STAPLER VISISTAT 35W (STAPLE) ×3 IMPLANT
STRIP CLOSURE SKIN 1/2X4 (GAUZE/BANDAGES/DRESSINGS) IMPLANT
SUT MNCRL AB 4-0 PS2 18 (SUTURE) ×3 IMPLANT
SUT SILK 2 0 (SUTURE)
SUT SILK 2-0 18XBRD TIE 12 (SUTURE) IMPLANT
SUT SILK 3 0 (SUTURE)
SUT SILK 3-0 18XBRD TIE 12 (SUTURE) IMPLANT
SUT VIC AB 3-0 SH 18 (SUTURE) ×6 IMPLANT
SYR BULB IRRIGATION 50ML (SYRINGE) ×3 IMPLANT
SYR CONTROL 10ML LL (SYRINGE) ×3 IMPLANT
TAPE CLOTH SURG 4X10 WHT LF (GAUZE/BANDAGES/DRESSINGS) ×3 IMPLANT
TOWEL OR 17X26 10 PK STRL BLUE (TOWEL DISPOSABLE) ×3 IMPLANT
TOWEL OR NON WOVEN STRL DISP B (DISPOSABLE) ×3 IMPLANT
YANKAUER SUCT BULB TIP 10FT TU (MISCELLANEOUS) ×3 IMPLANT

## 2017-03-30 NOTE — Brief Op Note (Signed)
03/30/2017  10:49 AM  PATIENT:  Kristin [name]  62 y.o. female  PRE-OPERATIVE DIAGNOSIS:  primary hyperparathyroidisim  POST-OPERATIVE DIAGNOSIS:  primary hyperparathyroidisim  PROCEDURE:  Procedure(s): RIGHT INFERIOR PARATHYROIDECTOMY, BIOPSY OF RIGHT SUPERIOR PARATHYROIDECTIMY (Right)  SURGEON:  Surgeon(s) and Role:    * Armandina Gemma, MD - Primary  ANESTHESIA:   general  EBL:  5 mL   BLOOD ADMINISTERED:none  DRAINS: none   LOCAL MEDICATIONS USED:  MARCAINE     SPECIMEN:  Excision  DISPOSITION OF SPECIMEN:  PATHOLOGY  COUNTS:  YES  TOURNIQUET:  * No tourniquets in log *  DICTATION: .Other Dictation: Dictation Number [id number]  PLAN OF CARE: Discharge to home after PACU  PATIENT DISPOSITION:  PACU - hemodynamically stable.   Delay start of Pharmacological VTE agent (>24hrs) due to surgical blood loss or risk of bleeding: yes  Armandina Gemma, MD [facility] Surgery Office: [phone]

## 2017-03-30 NOTE — Interval H&P Note (Signed)
History and Physical Interval Note:  03/30/2017 8:58 AM  Kristin [name]  has presented today for surgery, with the diagnosis of primary hyperparathyroidism.  The various methods of treatment have been discussed with the patient and family. After consideration of risks, benefits and other options for treatment, the patient has consented to    Procedure(s): RIGHT PARATHYROIDECTOMY (Right) as a surgical intervention .    The patient's history has been reviewed, patient examined, no change in status, stable for surgery.  I have reviewed the patient's chart and labs.  Questions were answered to the patient's satisfaction.    Armandina Gemma, [place] Surgery Office: [place]

## 2017-03-30 NOTE — Transfer of Care (Signed)
Immediate Anesthesia Transfer of Care Note  Patient: Kristin [name]  Procedure(s) Performed: RIGHT INFERIOR PARATHYROIDECTOMY, BIOPSY OF RIGHT SUPERIOR PARATHYROIDECTIMY (Right )  Patient Location: PACU  Anesthesia Type:General  Level of Consciousness: sedated, patient cooperative and responds to stimulation  Airway & Oxygen Therapy: Patient Spontanous Breathing and Patient connected to face mask oxygen  Post-op Assessment: Report given to RN and Post -op Vital signs reviewed and stable  Post vital signs: Reviewed and stable  Last Vitals:  Vitals:   03/30/17 0755  BP: (!) 142/79  Pulse: 61  Resp: 18  Temp: 36.6 C  SpO2: 100%    Last Pain:  Vitals:   03/30/17 0755  TempSrc: Oral      Patients Stated Pain Goal: 4 (71/06/26 9485)  Complications: No apparent anesthesia complications

## 2017-03-30 NOTE — Anesthesia Procedure Notes (Signed)
Procedure Name: Intubation Performed by: Gean Maidens, CRNA Pre-anesthesia Checklist: Patient identified, Emergency Drugs available, Suction available, Patient being monitored and Timeout performed Patient Re-evaluated:Patient Re-evaluated prior to induction Oxygen Delivery Method: Circle system utilized Preoxygenation: Pre-oxygenation with 100% oxygen Induction Type: IV induction Ventilation: Mask ventilation without difficulty Laryngoscope Size: 3 and Glidescope (Grade 4 view mac 3 grade 1 view glidescope) Grade View: Grade I Tube type: Oral Tube size: 6.5 mm Number of attempts: 2 Airway Equipment and Method: Stylet and Video-laryngoscopy Placement Confirmation: ETT inserted through vocal cords under direct vision,  positive ETCO2,  CO2 detector and breath sounds checked- equal and bilateral Secured at: 21 cm Tube secured with: Tape Dental Injury: Teeth and Oropharynx as per pre-operative assessment

## 2017-03-30 NOTE — Anesthesia Postprocedure Evaluation (Signed)
Anesthesia Post Note  Patient: Kristin [name]  Procedure(s) Performed: RIGHT INFERIOR PARATHYROIDECTOMY, BIOPSY OF RIGHT SUPERIOR PARATHYROIDECTIMY (Right )     Patient location during evaluation: PACU Anesthesia Type: General Level of consciousness: awake and alert Pain management: pain level controlled Vital Signs Assessment: post-procedure vital signs reviewed and stable Respiratory status: spontaneous breathing, nonlabored ventilation, respiratory function stable and patient connected to nasal cannula oxygen Cardiovascular status: blood pressure returned to baseline and stable Postop Assessment: no apparent nausea or vomiting Anesthetic complications: no    Last Vitals:  Vitals:   03/30/17 1100 03/30/17 1115  BP: (!) 177/100 (!) 157/91  Pulse: 90 79  Resp: 16 12  Temp:    SpO2: 96% 98%    Last Pain:  Vitals:   03/30/17 1115  TempSrc:   PainSc: Asleep                 Selby Slovacek

## 2017-03-31 NOTE — Addendum Note (Signed)
Addendum  created 03/31/17 5217 by Lollie Sails, CRNA   Charge Capture section accepted

## 2017-03-31 NOTE — Op Note (Signed)
Kristin [name], Kristin [name]               ACCOUNT NO.:  [id number]  MEDICAL RECORD NO.:  [id number]  LOCATION:                                 FACILITY:  PHYSICIAN:  Earnstine Regal, MD      DATE OF BIRTH:  1955/11/26  DATE OF PROCEDURE:  03/30/2017                              OPERATIVE REPORT   PREOPERATIVE DIAGNOSIS:  Primary hyperparathyroidism.  POSTOPERATIVE DIAGNOSIS:  Primary hyperparathyroidism.  PROCEDURES: 1. Neck exploration. 2. Right inferior parathyroidectomy. 3. Biopsy of right superior parathyroid gland.  SURGEON:  Earnstine Regal, MD  ANESTHESIA:  General.  ESTIMATED BLOOD LOSS:  Minimal.  PREPARATION:  ChloraPrep.  COMPLICATIONS:  None.  INDICATIONS:  The patient is a 62 year old female with a 4-year history of primary hyperparathyroidism.  Most recent studies showed an elevated calcium level of 10.7 and an elevated intact PTH level of 60-76.  The patient had undergone nuclear medicine parathyroid scan, which failed to localize evidence of parathyroid adenoma.  The patient underwent a 4D CT scan, which suggested a right-sided parathyroid adenoma.  The patient now comes to Surgery for neck exploration.  BODY OF REPORT:  Procedure was done in OR #10 at the [facility].  The patient was brought to the operating room and placed in supine position on the operating room table.  Following administration of general anesthesia, the patient was positioned and then prepped and draped in the usual aseptic fashion.  After ascertaining that an adequate level of anesthesia had been achieved, an incision was made in the right anterior neck with a #15 blade. Dissection was carried through subcutaneous tissues and platysma. Subplatysmal flaps were elevated circumferentially and a Weitlaner retractor placed for exposure.  Strap muscles were incised in the midline and reflected to the right exposing the right thyroid lobe. Right lobe was gently mobilized.   Venous tributaries were divided between Ligaclips.  There was an abnormal-appearing parathyroid gland just inferior to the right thyroid lobe.  This measured approximately 5 mm in greatest dimension.  It was relatively firm and consistent with a small adenoma.  It was left in situ at this time.  Further dissection behind the right lobe of the thyroid revealed a prominent tubercle of thyroid tissue adjacent to the recurrent laryngeal nerve.  Just superior to the nerve; however, was a normal-appearing parathyroid gland, which was mobilized on its vascular pedicle.  The right inferior parathyroid gland was then excised by elevating it and dividing the vascular pedicle between medium Ligaclips.  The entire gland was submitted to Pathology where frozen section confirmed a hypercellular parathyroid gland.  Also, the right superior gland was elevated and a medium clip was used to place across the midportion of the glandular tissue, and using a #15 blade, approximately 40% of the glandular tissue was sharply excised and submitted for frozen section.  This was parathyroid tissue on frozen section, but appeared to not be consistent with adenoma and was not hypercellular.  Therefore, the remainder of the right superior gland was left in situ.  Neck was irrigated with warm saline.  Fibrillar was placed throughout the operative field.  Strap muscles were reapproximated in the midline with interrupted  3-0 Vicryl sutures.  Platysma was closed with interrupted 3-0 Vicryl sutures.  Skin was closed with a running 4-0 Monocryl subcuticular suture.  Skin was anesthetized with local anesthetic.  Wound was washed and dried, and Steri-Strips were applied. Sterile dressings were applied.  The patient was awakened from anesthesia and brought to the recovery room.  The patient tolerated the procedure well.   Armandina Gemma, [place] Surgery Office: [phone]    TMG/MEDQ  D:  03/30/2017  T:   03/31/2017  Job:  [id number]

## 2017-04-24 ENCOUNTER — Other Ambulatory Visit: Payer: Self-pay | Admitting: Nurse Practitioner

## 2017-08-21 ENCOUNTER — Other Ambulatory Visit: Payer: Self-pay | Admitting: Nurse Practitioner

## 2017-08-21 NOTE — Telephone Encounter (Signed)
Last seen 09/07/15

## 2017-08-22 NOTE — Telephone Encounter (Signed)
marcch 18 last yr seen, advised then to f u in six mo , give only one mo worth mplus one ref , write must be seen before further refills on the rx

## 2017-10-24 ENCOUNTER — Other Ambulatory Visit: Payer: Self-pay | Admitting: Family Medicine

## 2017-10-24 NOTE — Telephone Encounter (Signed)
Thirty day, needs o v befor further

## 2017-11-24 ENCOUNTER — Other Ambulatory Visit: Payer: Self-pay | Admitting: Family Medicine

## 2017-11-24 NOTE — Telephone Encounter (Signed)
15 only writes needs o v on it

## 2017-12-20 ENCOUNTER — Telehealth: Payer: Self-pay | Admitting: Family Medicine

## 2017-12-20 NOTE — Telephone Encounter (Signed)
Physical w/ Ria Comment 12/29/2017, pt requesting blood work, pt would like to complete before hand. Advise.

## 2017-12-20 NOTE — Telephone Encounter (Signed)
Patient had labs by [place] last 09/06/2005 Intact pth with calcium,Vit D hydroxy, tsh,bmet,hepatic,lip. Same ? Please advise.

## 2017-12-26 ENCOUNTER — Other Ambulatory Visit: Payer: Self-pay

## 2017-12-26 ENCOUNTER — Other Ambulatory Visit: Payer: Self-pay | Admitting: Family Medicine

## 2017-12-26 DIAGNOSIS — E213 Hyperparathyroidism, unspecified: Secondary | ICD-10-CM

## 2017-12-26 DIAGNOSIS — Z0000 Encounter for general adult medical examination without abnormal findings: Secondary | ICD-10-CM

## 2017-12-26 DIAGNOSIS — E559 Vitamin D deficiency, unspecified: Secondary | ICD-10-CM

## 2017-12-26 DIAGNOSIS — I10 Essential (primary) hypertension: Secondary | ICD-10-CM

## 2017-12-26 NOTE — Telephone Encounter (Signed)
Lab orders placed and patient is aware.  °

## 2017-12-26 NOTE — Telephone Encounter (Signed)
Sure repeat same 

## 2017-12-27 DIAGNOSIS — I10 Essential (primary) hypertension: Secondary | ICD-10-CM | POA: Diagnosis not present

## 2017-12-27 DIAGNOSIS — E213 Hyperparathyroidism, unspecified: Secondary | ICD-10-CM | POA: Diagnosis not present

## 2017-12-27 DIAGNOSIS — Z0000 Encounter for general adult medical examination without abnormal findings: Secondary | ICD-10-CM | POA: Diagnosis not present

## 2017-12-28 LAB — HEPATIC FUNCTION PANEL
ALBUMIN: 4.1 g/dL (ref 3.6–4.8)
ALT: 16 IU/L (ref 0–32)
AST: 20 IU/L (ref 0–40)
Alkaline Phosphatase: 72 IU/L (ref 39–117)
BILIRUBIN TOTAL: 0.4 mg/dL (ref 0.0–1.2)
BILIRUBIN, DIRECT: 0.09 mg/dL (ref 0.00–0.40)
TOTAL PROTEIN: 6.6 g/dL (ref 6.0–8.5)

## 2017-12-28 LAB — BASIC METABOLIC PANEL
BUN/Creatinine Ratio: 13 (ref 12–28)
BUN: 11 mg/dL (ref 8–27)
CO2: 21 mmol/L (ref 20–29)
CREATININE: 0.85 mg/dL (ref 0.57–1.00)
Calcium: 9.5 mg/dL (ref 8.7–10.3)
Chloride: 104 mmol/L (ref 96–106)
GFR, EST AFRICAN AMERICAN: 84 mL/min/{1.73_m2} (ref >59)
GFR, EST NON AFRICAN AMERICAN: 73 mL/min/{1.73_m2} (ref >59)
Glucose: 115 mg/dL — ABNORMAL HIGH (ref 65–99)
POTASSIUM: 3.9 mmol/L (ref 3.5–5.2)
SODIUM: 139 mmol/L (ref 134–144)

## 2017-12-28 LAB — LIPID PANEL
CHOLESTEROL TOTAL: 197 mg/dL (ref 100–199)
Chol/HDL Ratio: 2.9 ratio (ref 0.0–4.4)
HDL: 68 mg/dL (ref >39)
LDL CALC: 112 mg/dL — AB (ref 0–99)
TRIGLYCERIDES: 83 mg/dL (ref 0–149)
VLDL CHOLESTEROL CAL: 17 mg/dL (ref 5–40)

## 2017-12-28 LAB — VITAMIN D 25 HYDROXY (VIT D DEFICIENCY, FRACTURES): VIT D 25 HYDROXY: 26.2 ng/mL — AB (ref 30.0–100.0)

## 2017-12-28 LAB — PTH, INTACT AND CALCIUM: PTH: 69 pg/mL — ABNORMAL HIGH (ref 15–65)

## 2017-12-28 LAB — TSH: TSH: 2.61 u[IU]/mL (ref 0.450–4.500)

## 2017-12-29 ENCOUNTER — Encounter: Payer: Self-pay | Admitting: Family Medicine

## 2018-01-02 ENCOUNTER — Ambulatory Visit (INDEPENDENT_AMBULATORY_CARE_PROVIDER_SITE_OTHER): Payer: Self-pay | Admitting: Family Medicine

## 2018-01-02 ENCOUNTER — Encounter: Payer: Self-pay | Admitting: Family Medicine

## 2018-01-02 VITALS — BP 138/88 | Ht 67.25 in | Wt 174.0 lb

## 2018-01-02 DIAGNOSIS — F419 Anxiety disorder, unspecified: Secondary | ICD-10-CM

## 2018-01-02 DIAGNOSIS — Z0000 Encounter for general adult medical examination without abnormal findings: Secondary | ICD-10-CM

## 2018-01-02 DIAGNOSIS — E213 Hyperparathyroidism, unspecified: Secondary | ICD-10-CM

## 2018-01-02 DIAGNOSIS — A084 Viral intestinal infection, unspecified: Secondary | ICD-10-CM

## 2018-01-02 DIAGNOSIS — E559 Vitamin D deficiency, unspecified: Secondary | ICD-10-CM

## 2018-01-02 DIAGNOSIS — Z23 Encounter for immunization: Secondary | ICD-10-CM

## 2018-01-02 MED ORDER — CITALOPRAM HYDROBROMIDE 20 MG PO TABS: 20.0000 mg | ORAL_TABLET | Freq: Every day | ORAL | 5 refills | Status: DC

## 2018-01-02 NOTE — Progress Notes (Signed)
Subjective:    Patient ID: Kristin [name], female    DOB: 01-29-1955, 62 y.o.   MRN: [id number]  HPI The patient comes in today for a wellness visit.  A review of their health history was completed. A review of medications was also completed.  Any needed refills; Yes on her Citalopram   Eating habits: Good; working on diet, states has not been eating like she should  Falls/  MVA accidents in past few months: No  Regular exercise: Yes,but not like she needs to. Reports active with grandchildren.  Specialist pt sees on regular basis: No   Preventative health issues were discussed.  Pap smear was normal in 2017, no hx of abnormals. Pt will schedule mammogram. Last colonoscopy done in 2018 - q5 years for surveillance.   Additional concerns: Has had diarrhea for one week, occurring daily. Reports once last week had involuntary loss of stool and stool looked mucousy. Denies blood in stool. Reports stool is sometimes watery sometimes just loose. Reports having diarrhea 5-6 times per day. Denies any fever, denies any abdominal pain. Denies recent antibiotic use or recent hospitalizations. Reports she visited a hospice house last Sunday, the day before the diarrhea started.   Anxiety: reports taking celexa daily, states is helpful for her anxiety. Denies SI/HI.   Post menopausal, denies vaginal bleeding, not sexually active currently, denies problems.   Review of Systems  Constitutional: Negative for chills, fatigue, fever and unexpected weight change.  HENT: Negative for congestion, ear pain, sinus pressure, sinus pain and sore throat.   Eyes: Negative for discharge and visual disturbance.  Respiratory: Negative for cough, shortness of breath and wheezing.   Cardiovascular: Negative for chest pain and leg swelling.  Gastrointestinal: Positive for diarrhea. Negative for abdominal pain, blood in stool, constipation, nausea and vomiting.  Genitourinary: Negative for difficulty urinating,  hematuria, vaginal bleeding and vaginal discharge.  Neurological: Negative for dizziness, weakness, light-headedness and headaches.  Psychiatric/Behavioral: Negative for suicidal ideas.  All other systems reviewed and are negative.      Objective:   Physical Exam Vitals signs and nursing note reviewed. Exam conducted with a chaperone present.  Constitutional:      General: She is not in acute distress.    Appearance: She is well-developed.  HENT:     Head: Normocephalic and atraumatic.     Right Ear: Tympanic membrane normal.     Left Ear: Tympanic membrane normal.     Nose: Nose normal.     Mouth/Throat:     Pharynx: Uvula midline.  Eyes:     General:        Right eye: No discharge.        Left eye: No discharge.     Conjunctiva/sclera: Conjunctivae normal.     Pupils: Pupils are equal, round, and reactive to light.  Neck:     Musculoskeletal: Neck supple.     Thyroid: No thyromegaly.  Cardiovascular:     Rate and Rhythm: Normal rate and regular rhythm.     Heart sounds: Normal heart sounds. No murmur.  Pulmonary:     Effort: Pulmonary effort is normal. No respiratory distress.     Breath sounds: Normal breath sounds. No wheezing.  Chest:     Breasts: Breasts are symmetrical.        Right: Normal.        Left: Normal.  Abdominal:     General: Bowel sounds are increased. There is no distension.     Palpations:  Abdomen is soft. There is no mass.     Tenderness: There is no abdominal tenderness. There is no guarding.  Genitourinary:    General: Normal vulva.     Labia:        Right: No rash, tenderness or lesion.        Left: No rash, tenderness or lesion.      Vagina: Normal.     Cervix: Normal.     Uterus: Normal.      Adnexa: Right adnexa normal and left adnexa normal.     Comments: No tenderness or obvious masses noted on bimanual exam. Musculoskeletal:        General: No deformity.  Lymphadenopathy:     Cervical: No cervical adenopathy.  Skin:    General:  Skin is warm and dry.  Neurological:     Mental Status: She is alert and oriented to person, place, and time.     Coordination: Coordination normal.  Psychiatric:        Mood and Affect: Mood normal.           Assessment & Plan:  1. Encounter for wellness examination in adult Adult wellness-complete.wellness physical was conducted today. Importance of diet and exercise were discussed in detail.  In addition to this a discussion regarding safety was also covered. We also reviewed over immunizations and gave recommendations regarding current immunization needed for age.  In addition to this additional areas were also touched on including: Preventative health exams needed:  Colonoscopy UTD Mammogram: pt will schedule Pap smear UTD  Patient was advised yearly wellness exam.  2. Anxiety Doing well on current dose of celexa. phq 9 and gad 7 reviewed with patient. Pt would like to continue medication, she is unsure of what pharmacy she will use, requesting written rx. Rx given.  3. Need for influenza vaccination - Plan: Flu Vaccine QUAD 36+ mos IM  4. Need for vaccination - Plan: Tdap vaccine greater than or equal to 7yo IM  5. Viral gastroenteritis Discussed likely viral etiology of diarrhea. Recommend symptomatic care, starting probiotic, staying hydrated. Warning signs discussed, if symptoms worsen or fail to improve over the next several days she should f/u.   6. Hyperparathyroidism ([place]) Normal calcium level, PTH slightly elevated, will continue to monitor.   7. Vitamin D deficiency Recommend increasing otc vitamin d supplement to 3000 units daily.   Lab work reviewed with patient and Dr. Richardson Landry.   Dr. Mickie Hillier was consulted on this case and is in agreement with the above treatment plan.

## 2018-01-02 NOTE — Patient Instructions (Signed)
Increase over the counter vitamin D supplement to 3000 units daily.  May start taking an over the counter probiotic, may use over the counter imodium occasionally for diarrhea. Ensure you stay well hydrated.  Follow up if you notice your diarrhea is not improving or if it gets worse, if you develop fever or abdominal pain.  Viral Gastroenteritis, Adult Viral gastroenteritis is also known as the stomach flu. This condition is caused by various viruses. These viruses can be passed from person to person very easily (are very contagious). This condition may affect your stomach, small intestine, and large intestine. It can cause sudden watery diarrhea, fever, and vomiting. Diarrhea and vomiting can make you feel weak and cause you to become dehydrated. You may not be able to keep fluids down. Dehydration can make you tired and thirsty, cause you to have a dry mouth, and decrease how often you urinate. Older adults and people with other diseases or a weak immune system are at higher risk for dehydration. It is important to replace the fluids that you lose from diarrhea and vomiting. If you become severely dehydrated, you may need to get fluids through an IV tube. What are the causes? Gastroenteritis is caused by various viruses, including rotavirus and norovirus. Norovirus is the most common cause in adults. You can get sick by eating food, drinking water, or touching a surface contaminated with one of these viruses. You can also get sick from sharing utensils or other personal items with an infected person. What increases the risk? This condition is more likely to develop in people:  Who have a weak defense system (immune system).  Who live with one or more children who are younger than 42 years old.  Who live in a nursing home.  Who go on cruise ships.  What are the signs or symptoms? Symptoms of this condition start suddenly 1-2 days after exposure to a virus. Symptoms may last a few days or as  long as a week. The most common symptoms are watery diarrhea and vomiting. Other symptoms include:  Fever.  Headache.  Fatigue.  Pain in the abdomen.  Chills.  Weakness.  Nausea.  Muscle aches.  Loss of appetite.  How is this diagnosed? This condition is diagnosed with a medical history and physical exam. You may also have a stool test to check for viruses or other infections. How is this treated? This condition typically goes away on its own. The focus of treatment is to restore lost fluids (rehydration). Your health care provider may recommend that you take an oral rehydration solution (ORS) to replace important salts and minerals (electrolytes) in your body. Severe cases of this condition may require giving fluids through an IV tube. Treatment may also include medicine to help with your symptoms. Follow these instructions at home: Follow instructions from your health care provider about how to care for yourself at home. Eating and drinking Follow these recommendations as told by your health care provider:  Take an ORS. This is a drink that is sold at pharmacies and retail stores.  Drink clear fluids in small amounts as you are able. Clear fluids include water, ice chips, diluted fruit juice, and low-calorie sports drinks.  Eat bland, easy-to-digest foods in small amounts as you are able. These foods include bananas, applesauce, rice, lean meats, toast, and crackers.  Avoid fluids that contain a lot of sugar or caffeine, such as energy drinks, sports drinks, and soda.  Avoid alcohol.  Avoid spicy or fatty foods.  General instructions   Drink enough fluid to keep your urine clear or pale yellow.  Wash your hands often. If soap and water are not available, use hand sanitizer.  Make sure that all people in your household wash their hands well and often.  Take over-the-counter and prescription medicines only as told by your health care provider.  Rest at home while  you recover.  Watch your condition for any changes.  Take a warm bath to relieve any burning or pain from frequent diarrhea episodes.  Keep all follow-up visits as told by your health care provider. This is important. Contact a health care provider if:  You cannot keep fluids down.  Your symptoms get worse.  You have new symptoms.  You feel light-headed or dizzy.  You have muscle cramps. Get help right away if:  You have chest pain.  You feel extremely weak or you faint.  You see blood in your vomit.  Your vomit looks like coffee grounds.  You have bloody or black stools or stools that look like tar.  You have a severe headache, a stiff neck, or both.  You have a rash.  You have severe pain, cramping, or bloating in your abdomen.  You have trouble breathing or you are breathing very quickly.  Your heart is beating very quickly.  Your skin feels cold and clammy.  You feel confused.  You have pain when you urinate.  You have signs of dehydration, such as: ? Dark urine, very little urine, or no urine. ? Cracked lips. ? Dry mouth. ? Sunken eyes. ? Sleepiness. ? Weakness. This information is not intended to replace advice given to you by your health care provider. Make sure you discuss any questions you have with your health care provider. Document Released: 01/03/2005 Document Revised: 06/17/2015 Document Reviewed: 09/09/2014 Elsevier Interactive Patient Education  [employer].

## 2018-07-02 ENCOUNTER — Ambulatory Visit: Payer: Self-pay | Admitting: Family Medicine

## 2018-07-11 ENCOUNTER — Other Ambulatory Visit: Payer: Self-pay

## 2018-07-11 ENCOUNTER — Ambulatory Visit (INDEPENDENT_AMBULATORY_CARE_PROVIDER_SITE_OTHER): Payer: 59 | Admitting: Nurse Practitioner

## 2018-07-11 ENCOUNTER — Encounter: Payer: Self-pay | Admitting: Nurse Practitioner

## 2018-07-11 DIAGNOSIS — F419 Anxiety disorder, unspecified: Secondary | ICD-10-CM | POA: Diagnosis not present

## 2018-07-11 MED ORDER — CITALOPRAM HYDROBROMIDE 20 MG PO TABS: 20.0000 mg | ORAL_TABLET | Freq: Every day | ORAL | 1 refills | Status: DC

## 2018-07-11 NOTE — Progress Notes (Signed)
   Subjective:  PHONE VISIT    Patient ID: Kristin [name], female    DOB: Oct 26, 1955, 63 y.o.   MRN: [id number]  Patient states she has not checked her blood pressure but feels like it is running good. Patient states she has put on some weight having to stay home.  Presents by phone call today to discuss her anxiety.  Doing very well on Celexa 20 mg daily.  Sleeping well.  Stay active, mainly doing yard work.  Family is noticed her anxiety is much improved.  Has gained some weight during quarantine.  Has lost a few pounds over the past week.  Denies any suicidal or homicidal thoughts or ideation.  Virtual Visit via Video Note  I connected with Kristin [name] on 07/11/18 at  9:20 AM EDT by a video enabled telemedicine application and verified that I am speaking with the correct person using two identifiers.  Location: Patient: home Provider: office   I discussed the limitations of evaluation and management by telemedicine and the availability of in person appointments. The patient expressed understanding and agreed to proceed.  History of Present Illness:    Observations/Objective: Today's visit was via telephone Physical exam was not possible for this visit On phone call, patient is alert and oriented, cheerful affect.  Thoughts logical coherent and relevant.  Assessment and Plan: Problem List Items Addressed This Visit      Other   Anxiety - Primary   Relevant Medications   citalopram (CELEXA) 20 MG tablet       Follow Up Instructions: Continue citalopram as directed.  Continue weight loss efforts and activity.  Reminded about wellness exam due in December.  Call back sooner if any problems.   I discussed the assessment and treatment plan with the patient. The patient was provided an opportunity to ask questions and all were answered. The patient agreed with the plan and demonstrated an understanding of the instructions.   The patient was advised to call back or seek an  in-person evaluation if the symptoms worsen or if the condition fails to improve as anticipated.  I provided 15 minutes of non-face-to-face time during this encounter.       Review of Systems     Objective:   Physical Exam        Assessment & Plan:

## 2019-01-21 ENCOUNTER — Other Ambulatory Visit: Payer: Self-pay | Admitting: Nurse Practitioner

## 2019-01-22 NOTE — Telephone Encounter (Signed)
Last med check 07/11/18

## 2019-02-21 ENCOUNTER — Encounter: Payer: Self-pay | Admitting: Family Medicine

## 2019-07-09 ENCOUNTER — Ambulatory Visit: Payer: Medicare Other | Admitting: Orthopaedic Surgery

## 2019-07-16 ENCOUNTER — Other Ambulatory Visit: Payer: Self-pay | Admitting: Family Medicine

## 2019-07-16 NOTE — Telephone Encounter (Signed)
Scheduled 7/27

## 2019-08-04 ENCOUNTER — Other Ambulatory Visit: Payer: Self-pay

## 2019-08-04 ENCOUNTER — Emergency Department (HOSPITAL_COMMUNITY)
Admission: EM | Admit: 2019-08-04 | Discharge: 2019-08-04 | Disposition: A | Discharge status: Home / Self Care | Payer: Medicare Other | Attending: Emergency Medicine | Admitting: Emergency Medicine

## 2019-08-04 ENCOUNTER — Emergency Department (HOSPITAL_COMMUNITY): Payer: Medicare Other

## 2019-08-04 ENCOUNTER — Encounter (HOSPITAL_COMMUNITY): Payer: Self-pay | Admitting: *Deleted

## 2019-08-04 DIAGNOSIS — S8991XA Unspecified injury of right lower leg, initial encounter: Secondary | ICD-10-CM | POA: Insufficient documentation

## 2019-08-04 DIAGNOSIS — I10 Essential (primary) hypertension: Secondary | ICD-10-CM | POA: Diagnosis not present

## 2019-08-04 DIAGNOSIS — Y999 Unspecified external cause status: Secondary | ICD-10-CM | POA: Insufficient documentation

## 2019-08-04 DIAGNOSIS — Y9301 Activity, walking, marching and hiking: Secondary | ICD-10-CM | POA: Diagnosis not present

## 2019-08-04 DIAGNOSIS — Y9289 Other specified places as the place of occurrence of the external cause: Secondary | ICD-10-CM | POA: Diagnosis not present

## 2019-08-04 DIAGNOSIS — W010XXA Fall on same level from slipping, tripping and stumbling without subsequent striking against object, initial encounter: Secondary | ICD-10-CM | POA: Diagnosis not present

## 2019-08-04 MED ORDER — ACETAMINOPHEN 325 MG PO TABS
650.0000 mg | ORAL_TABLET | Freq: Once | ORAL | Status: AC
  Administered 2019-08-04: 16:00:00 650 mg via ORAL
  Filled 2019-08-04: qty 2

## 2019-08-04 NOTE — ED Provider Notes (Signed)
[facility] Provider Note   CSN: [id number] Arrival date & time: 08/04/19  1332     History Chief Complaint  Patient presents with  . Leg Pain    right    Kristin [name] is a 64 y.o. female with a hsitory of hypertension, anemia, hyperlipidemia, & prior stroke who presents to the ED with complaints of R lower leg pain S/p fall @ 09:30 this AM.  Patient states that she was walking in high heels when she slipped and fell injuring the right lower leg.  She denies any prodromal lightheadedness, dizziness, chest pain, or dyspnea.  Patient states pain is located to the R lower anterior leg, worse with attempts to bear weight/move, alleviated some with 2 tablets of ibuprofen taken shortly PTA. Patient denies any other areas of injury. Denies head injury or LOC.  Denies numbness, tingling, weakness, neck pain, back pain, chest pain, or abdominal pain.  HPI     Past Medical History:  Diagnosis Date  . Adenocarcinoma of colon ([place]) 2008  . Anemia   . 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 Complication of anesthesia    "need childsized tube to go down my throat/dr in 2008" (05/14/2012)  . Hyperlipidemia   . Hyperparathyroidism ([place]) 01/2012  . Hypertension 05/14/2012   hx of being on hypertensive meds off meds since 2017   . Lyme disease 2010  . Peripheral vascular disease (HCC)    varcisoe veins in left leg   . Stroke [facility]) 2013   " Carolynn Sayers - had since birth "     Patient Active Problem List   Diagnosis Date Noted  . Osteopenia 04/06/2016  . Anxiety 03/09/2015  . Varicose veins of left lower extremity 03/09/2015  . Venous stasis dermatitis 11/03/2013  . Vitamin D deficiency 04/11/2013  . Hyperparathyroidism ([place]) 06/21/2012  . Other and unspecified hyperlipidemia 06/21/2012  . TIA (transient ischemic attack) 05/14/2012  . Stroke, hemorrhagic ([place]) 05/14/2012  . HTN (hypertension) 05/14/2012  . Adenocarcinoma of colon ([place]) 09/02/2010  . Lyme disease 09/02/2010    Past  Surgical History:  Procedure Laterality Date  . COLON RESECTION  2008  . COLON SURGERY    . COLONOSCOPY  10/12/2010   Procedure: COLONOSCOPY;  Surgeon: Jamesetta So;  [facility];  Service: Gastroenterology;  Laterality: N/A;  . COLONOSCOPY N/A 02/23/2016   Procedure: COLONOSCOPY;  Surgeon: Aviva Signs, MD;  Location[facility];  Service: Gastroenterology;  Laterality: N/A;  . FACIAL COSMETIC SURGERY     eyelids  . PARATHYROIDECTOMY Right 03/30/2017   Procedure: RIGHT INFERIOR PARATHYROIDECTOMY, BIOPSY OF RIGHT SUPERIOR PARATHYROIDECTIMY;  Surgeon: Armandina Gemma, MD;  Location: [facility];  Service: General;  Laterality: Right;  . WISDOM TOOTH EXTRACTION  2000's   "2" (05/14/2012)     OB History   No obstetric history on file.     Family History  Problem Relation Age of Onset  . Cancer Maternal Grandfather   . Hypertension Mother   . Heart attack Father     Social History   Tobacco Use  . Smoking status: Never Smoker  . Smokeless tobacco: Never Used  Vaping Use  . Vaping Use: Never used  Substance Use Topics  . Alcohol use: No  . Drug use: No    Home Medications Prior to Admission medications   Medication Sig Start Date End Date Taking? Authorizing Provider  Cholecalciferol (VITAMIN D) 2000 UNITS CAPS Take 2,000 Units by mouth daily.     [provider]  citalopram (CELEXA) 20 MG tablet TAKE 1 TABLET(20 MG) BY MOUTH AT BEDTIME 07/16/19   Lovena Le, Malena M, DO  ibuprofen (ADVIL,MOTRIN) 200 MG tablet Take 400-600 mg by mouth every 6 (six) hours as needed for headache or moderate pain.    [provider]  Polyethyl Glycol-Propyl Glycol (SYSTANE OP) Apply 1 drop to eye 4 (four) times daily as needed (dry eyes).    [provider]    Allergies    Keflex [cephalexin]  Review of Systems   Review of Systems  Constitutional: Negative for chills and fever.  Eyes: Negative for visual disturbance.  Respiratory: Negative for shortness of  breath.   Cardiovascular: Positive for leg swelling. Negative for chest pain.  Gastrointestinal: Negative for vomiting.  Musculoskeletal: Positive for myalgias. Negative for back pain and neck pain.  Skin: Negative for color change and wound.  Neurological: Negative for dizziness, syncope, weakness, light-headedness, numbness and headaches.  All other systems reviewed and are negative.   Physical Exam Updated Vital Signs BP (!) 167/106 (BP Location: Right Arm)   Pulse 84   Temp 98.5 F (36.9 C) (Oral)   Resp 20   Ht 5\' 7"  (1.702 m)   Wt 81.6 kg   SpO2 99%   BMI 28.19 kg/m   Physical Exam Vitals and nursing note reviewed.  Constitutional:      General: She is not in acute distress.    Appearance: She is well-developed. She is not toxic-appearing.  HENT:     Head: Normocephalic and atraumatic.  Eyes:     General:        Right eye: No discharge.        Left eye: No discharge.     Conjunctiva/sclera: Conjunctivae normal.     Pupils: Pupils are equal, round, and reactive to light.  Neck:     Comments: No midline spinal tenderness. Cardiovascular:     Rate and Rhythm: Normal rate and regular rhythm.     Comments: 2+ symmetric DP pulses. Pulmonary:     Effort: Pulmonary effort is normal. No respiratory distress.     Breath sounds: Normal breath sounds. No wheezing, rhonchi or rales.  Chest:     Chest wall: No tenderness.  Abdominal:     General: There is no distension.     Palpations: Abdomen is soft.     Tenderness: There is no abdominal tenderness. There is no guarding or rebound.  Musculoskeletal:     Cervical back: Normal range of motion and neck supple.     Comments: Upper extremities: No focal bony tenderness Back: No midline tenderness or palpable step-off Lower extremities: Patient has bruising with soft tissue swelling to the distal 1/3rd of the right lower leg.  This area is tender to palpation anteriorly.  Patient has intact active range of motion throughout  the lower extremities.  She is tender to palpation over the area of swelling/bruising, otherwise nontender.  No calf tenderness.  Skin:    General: Skin is warm and dry.     Findings: No rash.  Neurological:     Mental Status: She is alert.     Comments: Clear speech.  Sensation grossly intact bilateral lower extremities.  5-5 symmetric strength with plantar dorsiflexion bilaterally.  Psychiatric:        Behavior: Behavior normal.    ED Results / Procedures / Treatments   Labs (all labs ordered are listed, but only abnormal results are displayed) Labs Reviewed - No data to display  EKG None  Radiology DG Tibia/Fibula Right  Result Date: 08/04/2019 CLINICAL DATA:  Fall down stairs, distal lower leg tenderness EXAM: RIGHT TIBIA AND FIBULA - 2 VIEW COMPARISON:  None. FINDINGS: The tibia and fibula are intact. Alignment at the knee is grossly preserved with mild tricompartmental degenerative changes. Findings at the ankle are better detailed on dedicated ankle radiographs. There is soft tissue swelling of the lower leg most pronounced towards the anterior distal lower leg and ankle albeit with more mild circumferential swelling to the level of the mid calf. No soft tissue gas or foreign body. IMPRESSION: 1. Soft tissue swelling of the anterior lower leg and ankle with more mild circumferential swelling to the level of the mid calf. No soft tissue gas or foreign body. 2. Mild tricompartmental degenerative changes in the knee. Electronically Signed   By: Lovena Le M.D.   On: 08/04/2019 15:06   DG Ankle Complete Right  Result Date: 08/04/2019 CLINICAL DATA:  Fall down stairs injuring right lower leg EXAM: RIGHT ANKLE - COMPLETE 3+ VIEW COMPARISON:  Concurrent tibia/fibular radiographs. FINDINGS: No acute bony abnormality. Specifically, no fracture, subluxation, or dislocation. Ankle mortise remains congruent. Circumferential swelling of the distal lower leg more pronounced anteriorly at the  level of the distal shin. No sizable ankle joint effusion. Plantar calcaneal spur. Midfoot and hindfoot alignment is grossly preserved though incompletely assessed on nondedicated, nonweightbearing films. IMPRESSION: Soft tissue swelling of the distal lower leg more pronounced anteriorly at the level of the distal shin. No acute osseous abnormality. Electronically Signed   By: Lovena Le M.D.   On: 08/04/2019 15:08    Procedures Procedures (including critical care time)  Medications Ordered in ED Medications - No data to display  ED Course  I have reviewed the triage vital signs and the nursing notes.  Pertinent labs & imaging results that were available during my care of the patient were reviewed by me and considered in my medical decision making (see chart for details).    MDM Rules/Calculators/A&P                         Patient presents to the ED with complaints of right lower leg pain S/p fall this AM. Nontoxic, vitals WNL with the exception of elevated BP- low suspicion for HTN emergency.  Additional history per nursing note & chart review.  Imaging Studies ordered:  X-ray of the R lower leg/ankle ordered per triage, I independently visualized and interpreted imaging which showed  Soft tissue swelling of the anterior lower leg and ankle with more mild circumferential swelling to the level of the mid calf. No soft tissue gas or foreign body. Mild tricompartmental degenerative changes in the knee  No fractures or dislocation noted on x-ray.  Patient does have a notable degree of swelling with bruising to the left lower anterior leg.  She does have good range of motion, no neuro deficits, and symmetric palpable pulses distally with soft compartments, does not seem consistent with compartment syndrome at this time. No other areas of injury noted on exam status post fall.  Will place in a walking boot with recommendation for motrin/tylenol (last renal function WNL), with orthopedics  follow-up. I discussed results, treatment plan, need for follow-up, and return precautions with the patient. Provided opportunity for questions, patient confirmed understanding and is in agreement with plan.   Portions of this note were generated with [employer]. Dictation errors may occur despite best attempts  at proofreading.  Findings and plan of care discussed with supervising physician Dr. Langston Masker who has evaluated patient as a shared visit and is in agreement.   Final Clinical Impression(s) / ED Diagnoses Final diagnoses:  Injury of right lower extremity, initial encounter    Rx / DC Orders ED Discharge Orders    None       Amaryllis Dyke, PA-C 08/04/19 1552    Wyvonnia Dusky, MD 08/04/19 [phone]

## 2019-08-04 NOTE — Discharge Instructions (Addendum)
Please read and follow all provided instructions.  You have been seen today for an injury to your right lower leg.  We have placed you in a walking boot to help with stabilization and compression.  Please wear this especially when up and moving around.  Please use crutches as needed.  Tests performed today include: An x-ray of the affected area - does NOT show any broken bones or dislocations.  Vital signs. See below for your results today.   Home care instructions: -- *PRICE in the first 24-48 hours after injury: Protect (with brace, splint, sling), if given by your provider Rest Ice- Do not apply ice pack directly to your skin, place towel or similar between your skin and ice/ice pack. Apply ice for 20 min, then remove for 40 min while awake Compression- Wear brace, elastic bandage, splint as directed by your provider Elevate affected extremity above the level of your heart when not walking around for the first 24-48 hours   Medications:  Please take Motrin and/or Tylenol per over-the-counter dosing to help with discomfort.  Follow-up instructions: Please follow-up with an orthopedist within 3 days for reevaluation.  We have provided you with Dr. Ruthe Mannan information who is local in [place] as well as Dr. Nona Dell information who is in [place]. Return instructions:  Please return if your digits or extremity are numb or tingling, appear gray or blue, or you have severe pain (also elevate the extremity and loosen splint or wrap if you were given one) Please return if you have redness or fevers.  Please return to the Emergency Department if you experience worsening symptoms.  Please return if you have any other emergent concerns. Additional Information:  Your vital signs today were: BP (!) 167/106 (BP Location: Right Arm)   Pulse 84   Temp 98.5 F (36.9 C) (Oral)   Resp 20   Ht 5\' 7"  (1.702 m)   Wt 81.6 kg   SpO2 99%   BMI 28.19 kg/m  If your blood pressure (BP) was  elevated above 135/85 this visit, please have this repeated by your doctor within one month. ---------------

## 2019-08-04 NOTE — ED Triage Notes (Signed)
Patient presents to the ED after a fall around 0900 this morning while walking in high heels.  Patient fell onto right leg.  Patient unable to bear weight on right leg.

## 2019-08-06 ENCOUNTER — Encounter: Payer: Self-pay | Admitting: Orthopaedic Surgery

## 2019-08-06 ENCOUNTER — Other Ambulatory Visit: Payer: Self-pay

## 2019-08-06 ENCOUNTER — Ambulatory Visit (INDEPENDENT_AMBULATORY_CARE_PROVIDER_SITE_OTHER): Payer: Medicare Other | Admitting: Orthopaedic Surgery

## 2019-08-06 VITALS — BP 163/100 | HR 67 | Ht 67.0 in | Wt 180.0 lb

## 2019-08-06 DIAGNOSIS — S8011XA Contusion of right lower leg, initial encounter: Secondary | ICD-10-CM | POA: Diagnosis not present

## 2019-08-06 DIAGNOSIS — S96911A Strain of unspecified muscle and tendon at ankle and foot level, right foot, initial encounter: Secondary | ICD-10-CM | POA: Diagnosis not present

## 2019-08-06 DIAGNOSIS — W108XXA Fall (on) (from) other stairs and steps, initial encounter: Secondary | ICD-10-CM

## 2019-08-06 NOTE — Progress Notes (Signed)
Subjective:    Patient ID: Kristin [name], female    DOB: April 12, 1955, 64 y.o.   MRN: [id number]  HPI She fell on her steps Sunday, 08-04-2019.  She hurt her right ankle and right lower leg.  She hit the steps with her lower leg and has developed swelling and pain.  She was seen in the ER and had x-rays done.  I have reviewed the notes from the ER.  I have independently reviewed and interpreted x-rays of this patient done at another site by another physician or qualified health professional.  She was given a CAM walker and crutches.  She has no fracture present.  She has some pain of the right hip area but it is better.  She has no other injury.  Her leg has bruising but swelling is going down.  She has no redness or fever.   Review of Systems  Constitutional: Positive for activity change.  Musculoskeletal: Positive for arthralgias, gait problem and joint swelling.  All other systems reviewed and are negative.  For Review of Systems, all other systems reviewed and are negative.  The following is a summary of the past history medically, past history surgically, known current medicines, social history and family history.  This information is gathered electronically by the computer from prior information and documentation.  I review this each visit and have found including this information at this point in the chart is beneficial and informative.   Past Medical History:  Diagnosis Date  . Adenocarcinoma of colon ([place]) 2008  . Anemia   . 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 Complication of anesthesia    "need childsized tube to go down my throat/dr in 2008" (05/14/2012)  . Hyperlipidemia   . Hyperparathyroidism ([place]) 01/2012  . Hypertension 05/14/2012   hx of being on hypertensive meds off meds since 2017   . Lyme disease 2010  . Peripheral vascular disease (HCC)    varcisoe veins in left leg   . Stroke [facility]) 2013   " Carolynn Sayers - had since birth "     Past Surgical History:  Procedure  Laterality Date  . COLON RESECTION  2008  . COLON SURGERY    . COLONOSCOPY  10/12/2010   Procedure: COLONOSCOPY;  Surgeon: Jamesetta So;  [facility];  Service: Gastroenterology;  Laterality: N/A;  . COLONOSCOPY N/A 02/23/2016   Procedure: COLONOSCOPY;  Surgeon: Aviva Signs, MD;  [facility];  Service: Gastroenterology;  Laterality: N/A;  . FACIAL COSMETIC SURGERY     eyelids  . PARATHYROIDECTOMY Right 03/30/2017   Procedure: RIGHT INFERIOR PARATHYROIDECTOMY, BIOPSY OF RIGHT SUPERIOR PARATHYROIDECTIMY;  Surgeon: Armandina Gemma, MD;  Location[facility];  Service: General;  Laterality: Right;  . WISDOM TOOTH EXTRACTION  2000's   "2" (05/14/2012)    Current Outpatient Medications on File Prior to Visit  Medication Sig Dispense Refill  . Cholecalciferol (VITAMIN D) 2000 UNITS CAPS Take 2,000 Units by mouth daily.     . citalopram (CELEXA) 20 MG tablet TAKE 1 TABLET(20 MG) BY MOUTH AT BEDTIME 90 tablet 0  . ibuprofen (ADVIL,MOTRIN) 200 MG tablet Take 400-600 mg by mouth every 6 (six) hours as needed for headache or moderate pain.    [name] Glycol-Propyl Glycol (SYSTANE OP) Apply 1 drop to eye 4 (four) times daily as needed (dry eyes).     No current facility-administered medications on file prior to visit.    Social History   Socioeconomic History  . Marital status: Married  Spouse name: Not on file  . Number of children: Not on file  . Years of education: Not on file  . Highest education level: Not on file  Occupational History  . Not on file  Tobacco Use  . Smoking status: Never Smoker  . Smokeless tobacco: Never Used  Vaping Use  . Vaping Use: Never used  Substance and Sexual Activity  . Alcohol use: No  . Drug use: No  . Sexual activity: Yes  Other Topics Concern  . Not on file  Social History Narrative  . Not on file   Social Determinants of Health   Financial Resource Strain:   . Difficulty of Paying Living Expenses:   Food Insecurity:     . Worried About Charity fundraiser in the Last Year:   . Arboriculturist in the Last Year:   Transportation Needs:   . Film/video editor (Medical):   Marland Kitchen Lack of Transportation (Non-Medical):   Physical Activity:   . Days of Exercise per Week:   . Minutes of Exercise per Session:   Stress:   . Feeling of Stress :   Social Connections:   . Frequency of Communication with Friends and Family:   . Frequency of Social Gatherings with Friends and Family:   . Attends Religious Services:   . Active Member of Clubs or Organizations:   . Attends Archivist Meetings:   Marland Kitchen Marital Status:   Intimate Partner Violence:   . Fear of Current or Ex-Partner:   . Emotionally Abused:   Marland Kitchen Physically Abused:   . Sexually Abused:     Family History  Problem Relation Age of Onset  . Cancer Maternal Grandfather   . Hypertension Mother   . Heart attack Father     BP (!) 163/100   Pulse 67   Ht 5\' 7"  (1.702 m)   Wt 180 lb (81.6 kg)   BMI 28.19 kg/m   Body mass index is 28.19 kg/m.     Objective:   Physical Exam Vitals and nursing note reviewed.  Constitutional:      Appearance: She is well-developed.  HENT:     Head: Normocephalic and atraumatic.  Eyes:     Conjunctiva/sclera: Conjunctivae normal.     Pupils: Pupils are equal, round, and reactive to light.  Cardiovascular:     Rate and Rhythm: Normal rate and regular rhythm.  Pulmonary:     Effort: Pulmonary effort is normal.  Abdominal:     Palpations: Abdomen is soft.  Musculoskeletal:     Cervical back: Normal range of motion and neck supple.       Legs:  Skin:    General: Skin is warm and dry.  Neurological:     Mental Status: She is alert and oriented to person, place, and time.     Cranial Nerves: No cranial nerve deficit.     Motor: No abnormal muscle tone.     Coordination: Coordination normal.     Deep Tendon Reflexes: Reflexes are normal and symmetric. Reflexes normal.  Psychiatric:        Behavior:  Behavior normal.        Thought Content: Thought content normal.        Judgment: Judgment normal.           Assessment & Plan:   Encounter Diagnoses  Name Primary?  . Strain of right ankle, initial encounter Yes  . Hematoma of leg, right, initial encounter  Elevate and use ice.  Use crutches and CAM walker.  Instructions for Contrast Baths given.  Return in one week.  Begin one baby aspirin a day.  Move ankle and elevate.  Call if any problem.  Precautions discussed.   Electronically Signed Sanjuana Kava, MD 7/20/20212:53 PM

## 2019-08-13 ENCOUNTER — Ambulatory Visit (INDEPENDENT_AMBULATORY_CARE_PROVIDER_SITE_OTHER): Payer: Medicare Other | Admitting: Orthopaedic Surgery

## 2019-08-13 ENCOUNTER — Encounter: Payer: Self-pay | Admitting: Family Medicine

## 2019-08-13 ENCOUNTER — Ambulatory Visit (INDEPENDENT_AMBULATORY_CARE_PROVIDER_SITE_OTHER): Payer: Medicare Other | Admitting: Family Medicine

## 2019-08-13 ENCOUNTER — Encounter: Payer: Self-pay | Admitting: Orthopaedic Surgery

## 2019-08-13 ENCOUNTER — Other Ambulatory Visit: Payer: Self-pay

## 2019-08-13 ENCOUNTER — Encounter: Payer: Self-pay | Admitting: Radiology

## 2019-08-13 VITALS — BP 122/70 | Ht 67.0 in | Wt 180.0 lb

## 2019-08-13 VITALS — BP 122/70 | HR 79 | Temp 94.0°F

## 2019-08-13 DIAGNOSIS — Z13220 Encounter for screening for lipoid disorders: Secondary | ICD-10-CM

## 2019-08-13 DIAGNOSIS — F419 Anxiety disorder, unspecified: Secondary | ICD-10-CM

## 2019-08-13 DIAGNOSIS — Z131 Encounter for screening for diabetes mellitus: Secondary | ICD-10-CM | POA: Diagnosis not present

## 2019-08-13 DIAGNOSIS — L03115 Cellulitis of right lower limb: Secondary | ICD-10-CM | POA: Diagnosis not present

## 2019-08-13 DIAGNOSIS — S8011XA Contusion of right lower leg, initial encounter: Secondary | ICD-10-CM | POA: Diagnosis not present

## 2019-08-13 DIAGNOSIS — Z130 Encounter for screening for diseases of the blood and blood-forming organs and certain disorders involving the immune mechanism: Secondary | ICD-10-CM

## 2019-08-13 DIAGNOSIS — M8580 Other specified disorders of bone density and structure, unspecified site: Secondary | ICD-10-CM

## 2019-08-13 DIAGNOSIS — S96911A Strain of unspecified muscle and tendon at ankle and foot level, right foot, initial encounter: Secondary | ICD-10-CM

## 2019-08-13 DIAGNOSIS — Z1231 Encounter for screening mammogram for malignant neoplasm of breast: Secondary | ICD-10-CM

## 2019-08-13 DIAGNOSIS — Z1329 Encounter for screening for other suspected endocrine disorder: Secondary | ICD-10-CM

## 2019-08-13 MED ORDER — CITALOPRAM HYDROBROMIDE 20 MG PO TABS: 20.0000 mg | ORAL_TABLET | Freq: Every day | ORAL | 1 refills | Status: DC

## 2019-08-13 MED ORDER — SULFAMETHOXAZOLE-TRIMETHOPRIM 800-160 MG PO TABS: 1.0000 | ORAL_TABLET | Freq: Two times a day (BID) | ORAL | 1 refills | Status: DC

## 2019-08-13 NOTE — Progress Notes (Signed)
Patient Kristin [name], female DOB:1955/03/17, 64 y.o. PZW:[id number]  Chief Complaint  Patient presents with  . Ankle Injury    Rt ankle    HPI  Kristin [name] is a 64 y.o. female who has strain of the right ankle and hematoma. She has developed some redness of the distal right tibia over and near the hematoma.  I feel she has early cellulitis.  I will begin Septra DS.  I have told her that if the redness gets worse, call me or go to the ER.  She has no fever or chills.  The ecchymosis is spreading as one would expect.  She has no new trauma.  She cannot wear the CAM walker.   Body mass index is 28.19 kg/m.  ROS  Review of Systems  Constitutional: Positive for activity change.  Musculoskeletal: Positive for arthralgias, gait problem and joint swelling.  All other systems reviewed and are negative.   All other systems reviewed and are negative.  The following is a summary of the past history medically, past history surgically, known current medicines, social history and family history.  This information is gathered electronically by the computer from prior information and documentation.  I review this each visit and have found including this information at this point in the chart is beneficial and informative.    Past Medical History:  Diagnosis Date  . Adenocarcinoma of colon ([place]) 2008  . Anemia   . 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 Complication of anesthesia    "need childsized tube to go down my throat/dr in 2008" (05/14/2012)  . Hyperlipidemia   . Hyperparathyroidism ([place]) 01/2012  . Hypertension 05/14/2012   hx of being on hypertensive meds off meds since 2017   . Lyme disease 2010  . Peripheral vascular disease (HCC)    varcisoe veins in left leg   . Stroke [facility]) 2013   " Carolynn Sayers - had since birth "     Past Surgical History:  Procedure Laterality Date  . COLON RESECTION  2008  . COLON SURGERY    . COLONOSCOPY  10/12/2010   Procedure: COLONOSCOPY;  Surgeon: Jamesetta So;  [facility];  Service: Gastroenterology;  Laterality: N/A;  . COLONOSCOPY N/A 02/23/2016   Procedure: COLONOSCOPY;  Surgeon: Aviva Signs, MD;  [facility];  Service: Gastroenterology;  Laterality: N/A;  . FACIAL COSMETIC SURGERY     eyelids  . PARATHYROIDECTOMY Right 03/30/2017   Procedure: RIGHT INFERIOR PARATHYROIDECTOMY, BIOPSY OF RIGHT SUPERIOR PARATHYROIDECTIMY;  Surgeon: Armandina Gemma, MD;  [facility];  Service: General;  Laterality: Right;  . WISDOM TOOTH EXTRACTION  2000's   "2" (05/14/2012)    Family History  Problem Relation Age of Onset  . Cancer Maternal Grandfather   . Hypertension Mother   . Heart attack Father     Social History Social History   Tobacco Use  . Smoking status: Never Smoker  . Smokeless tobacco: Never Used  Vaping Use  . Vaping Use: Never used  Substance Use Topics  . Alcohol use: No  . Drug use: No    Allergies  Allergen Reactions  . Keflex [Cephalexin] Hives    Hives and itching    Current Outpatient Medications  Medication Sig Dispense Refill  . Cholecalciferol (VITAMIN D) 2000 UNITS CAPS Take 2,000 Units by mouth daily.     . citalopram (CELEXA) 20 MG tablet Take 1 tablet (20 mg total) by mouth daily. 90 tablet 1  . ibuprofen (ADVIL,MOTRIN) 200  MG tablet Take 400-600 mg by mouth every 6 (six) hours as needed for headache or moderate pain.    [name] Glycol-Propyl Glycol (SYSTANE OP) Apply 1 drop to eye 4 (four) times daily as needed (dry eyes).    Marland Kitchen sulfamethoxazole-trimethoprim (BACTRIM DS) 800-160 MG tablet Take 1 tablet by mouth 2 (two) times daily. 20 tablet 1   No current facility-administered medications for this visit.     Physical Exam  Blood pressure 122/70, height 5\' 7"  (1.702 m), weight 180 lb (81.6 kg).  Constitutional: overall normal hygiene, normal nutrition, well developed, normal grooming, normal body habitus. Assistive device:wheelchair  Musculoskeletal: gait and station  Limp right, muscle tone and strength are normal, no tremors or atrophy is present.  .  Neurological: coordination overall normal.  Deep tendon reflex/nerve stretch intact.  Sensation normal.  Cranial nerves II-XII intact.   Skin:   Normal overall no scars, lesions, ulcers or rashes. No psoriasis.  Psychiatric: Alert and oriented x 3.  Recent memory intact, remote memory unclear.  Normal mood and affect. Well groomed.  Good eye contact.  Cardiovascular: overall no swelling, no varicosities, no edema bilaterally, normal temperatures of the legs and arms, no clubbing, cyanosis and good capillary refill.  Lymphatic: palpation is normal.  Right lower leg with hematoma anterior and laterally with some redness.  Hematoma is size of small cucumber.  NV intact. There is significant ecchymosis more medially and of the dorsum of the foot and toes.  No redness is here.   All other systems reviewed and are negative   The patient has been educated about the nature of the problem(s) and counseled on treatment options.  The patient appeared to understand what I have discussed and is in agreement with it.  Encounter Diagnoses  Name Primary?  . Strain of right ankle, initial encounter Yes  . Hematoma of leg, right, initial encounter   . Cellulitis of right leg     PLAN Call if any problems.  Precautions discussed.  Continue current medications.   Return to clinic 1 week   I called in Septra DS.  Electronically Signed Sanjuana Kava, MD 7/27/202110:38 AM

## 2019-08-13 NOTE — Progress Notes (Signed)
Patient ID: Kristin [name], female    DOB: 1955/06/27, 64 y.o.   MRN: [id number]   Chief Complaint  Patient presents with  . Follow-up  . Anxiety   Subjective:    HPI   Pt here today for med check for anxiety. Pt is taking Celexa 20 mg once at bedtime. No side effects or complications.   Pt had bad sprain on the rt ankle in walking boot.  Seeing Dr. Luna Glasgow. Has appt today. 1 wk ago slipped on wet steps.  8-9 ago.  Saw carolyn np  6/20 over video note.  Doing well over past year.  H/o colon cancer in 2008.  Surgery. No chemo or radiation. No breast or uterine cancer or cervical cancer.   Medical History Rheagan has a past medical history of Adenocarcinoma of colon ([place]) (2008), Anemia, Cavernoma (04/9447), Complication of anesthesia, Hyperlipidemia, Hyperparathyroidism ([place]) (01/2012), Hypertension (05/14/2012), Lyme disease (2010), Peripheral vascular disease ([place]), and Stroke ([place]) (2013).   Outpatient Encounter Medications as of 08/13/2019  Medication Sig  . Cholecalciferol (VITAMIN D) 2000 UNITS CAPS Take 2,000 Units by mouth daily.   . citalopram (CELEXA) 20 MG tablet Take 1 tablet (20 mg total) by mouth daily.  Marland Kitchen ibuprofen (ADVIL,MOTRIN) 200 MG tablet Take 400-600 mg by mouth every 6 (six) hours as needed for headache or moderate pain.  [name] Glycol-Propyl Glycol (SYSTANE OP) Apply 1 drop to eye 4 (four) times daily as needed (dry eyes).  . [DISCONTINUED] citalopram (CELEXA) 20 MG tablet TAKE 1 TABLET(20 MG) BY MOUTH AT BEDTIME   No facility-administered encounter medications on file as of 08/13/2019.     Review of Systems  Constitutional: Negative for chills and fever.  HENT: Negative for congestion, rhinorrhea and sore throat.   Respiratory: Negative for cough, shortness of breath and wheezing.   Cardiovascular: Negative for chest pain and leg swelling.  Gastrointestinal: Negative for abdominal pain, diarrhea, nausea and vomiting.  Genitourinary: Negative  for dysuria and frequency.  Musculoskeletal: Negative for arthralgias and back pain.  Skin: Negative for rash.  Neurological: Negative for dizziness, weakness and headaches.  Psychiatric/Behavioral: Negative for dysphoric mood, sleep disturbance and suicidal ideas. The patient is nervous/anxious (controlled).      Vitals BP 122/70   Pulse 79   Temp (!) 94 F (34.4 C)   SpO2 98%   Objective:   Physical Exam Vitals and nursing note reviewed.  Constitutional:      General: She is not in acute distress.    Appearance: Normal appearance.  HENT:     Head: Normocephalic and atraumatic.  Cardiovascular:     Rate and Rhythm: Normal rate and regular rhythm.     Pulses: Normal pulses.     Heart sounds: Normal heart sounds.  Pulmonary:     Effort: Pulmonary effort is normal.     Breath sounds: Normal breath sounds. No wheezing, rhonchi or rales.  Musculoskeletal:        General: Normal range of motion.     Right lower leg: No edema.     Left lower leg: No edema.     Comments: +walking boot on rt foot.   Skin:    General: Skin is warm and dry.     Findings: No lesion or rash.  Neurological:     General: No focal deficit present.     Mental Status: She is alert and oriented to person, place, and time.     Cranial Nerves: No cranial nerve deficit.  Psychiatric:        Mood and Affect: Mood normal.        Behavior: Behavior normal.        Thought Content: Thought content normal.        Judgment: Judgment normal.      Assessment and Plan   1. Anxiety - citalopram (CELEXA) 20 MG tablet; Take 1 tablet (20 mg total) by mouth daily.  Dispense: 90 tablet; Refill: 1  2. Screening for thyroid disorder - TSH; Future  3. Screening for lipid disorders - Lipid panel; Future  4. Screening for diabetes mellitus - CMP14+EGFR; Future  5. Screening for deficiency anemia  6. Screening mammogram, encounter for - MM 3D SCREEN BREAST BILATERAL  7. Osteopenia, unspecified location -  DG Bone Density   Pt to get labs.  Anxiety- doing well on celexa.  Will continue.  F/u [address] prn.  Needing dexa scan and mammo..Marland Kitchen

## 2019-08-20 ENCOUNTER — Other Ambulatory Visit: Payer: Self-pay

## 2019-08-20 ENCOUNTER — Ambulatory Visit (INDEPENDENT_AMBULATORY_CARE_PROVIDER_SITE_OTHER): Payer: Medicare Other | Admitting: Orthopaedic Surgery

## 2019-08-20 ENCOUNTER — Telehealth: Payer: Self-pay | Admitting: Orthopaedic Surgery

## 2019-08-20 ENCOUNTER — Encounter: Payer: Self-pay | Admitting: Orthopaedic Surgery

## 2019-08-20 VITALS — BP 147/97 | HR 75 | Ht 67.0 in | Wt 170.0 lb

## 2019-08-20 DIAGNOSIS — S8011XA Contusion of right lower leg, initial encounter: Secondary | ICD-10-CM

## 2019-08-20 DIAGNOSIS — L03115 Cellulitis of right lower limb: Secondary | ICD-10-CM

## 2019-08-20 DIAGNOSIS — S96911D Strain of unspecified muscle and tendon at ankle and foot level, right foot, subsequent encounter: Secondary | ICD-10-CM

## 2019-08-20 DIAGNOSIS — S96911A Strain of unspecified muscle and tendon at ankle and foot level, right foot, initial encounter: Secondary | ICD-10-CM

## 2019-08-20 DIAGNOSIS — S8011XD Contusion of right lower leg, subsequent encounter: Secondary | ICD-10-CM | POA: Diagnosis not present

## 2019-08-20 NOTE — Telephone Encounter (Signed)
Kristin [name] called and stated that she saw you earlier she stated that she did not like the boot that she was given at [facility].  She said she does not want to wear that one.  She wants to know if she can get a boot from [place] or from our office here.  I told her that I would have to ask you.  Can you send an order for this?  Thanks

## 2019-08-20 NOTE — Progress Notes (Signed)
Patient Kristin [name], female DOB:Dec 03, 1955, 64 y.o. HUD:[id number]  Chief Complaint  Patient presents with  . Ankle Injury    right feels better    HPI  Kristin [name] is a 64 y.o. female who has hematoma right lower leg and then cellulitis.  She has been on the Septra DS this past week and is better. She has less pain.  She is walking better.   Body mass index is 26.63 kg/m.  ROS  Review of Systems  Constitutional: Positive for activity change.  Musculoskeletal: Positive for arthralgias, gait problem and joint swelling.  All other systems reviewed and are negative.   All other systems reviewed and are negative.  The following is a summary of the past history medically, past history surgically, known current medicines, social history and family history.  This information is gathered electronically by the computer from prior information and documentation.  I review this each visit and have found including this information at this point in the chart is beneficial and informative.    Past Medical History:  Diagnosis Date  . Adenocarcinoma of colon ([employer]) 2008  . Anemia   . 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 Complication of anesthesia    "need childsized tube to go down my throat/dr in 2008" (05/14/2012)  . Hyperlipidemia   . Hyperparathyroidism ([place]) 01/2012  . Hypertension 05/14/2012   hx of being on hypertensive meds off meds since 2017   . Lyme disease 2010  . Peripheral vascular disease (HCC)    varcisoe veins in left leg   . Stroke [facility]) 2013   " Kristin [name] - had since birth "     Past Surgical History:  Procedure Laterality Date  . COLON RESECTION  2008  . COLON SURGERY    . COLONOSCOPY  10/12/2010   Procedure: COLONOSCOPY;  Surgeon: Jamesetta So;  [facility];  Service: Gastroenterology;  Laterality: N/A;  . COLONOSCOPY N/A 02/23/2016   Procedure: COLONOSCOPY;  Surgeon: Aviva Signs, MD;  [facility];  Service: Gastroenterology;  Laterality: N/A;   . FACIAL COSMETIC SURGERY     eyelids  . PARATHYROIDECTOMY Right 03/30/2017   Procedure: RIGHT INFERIOR PARATHYROIDECTOMY, BIOPSY OF RIGHT SUPERIOR PARATHYROIDECTIMY;  Surgeon: Armandina Gemma, MD;  [facility];  Service: General;  Laterality: Right;  . WISDOM TOOTH EXTRACTION  2000's   "2" (05/14/2012)    Family History  Problem Relation Age of Onset  . Cancer Maternal Grandfather   . Hypertension Mother   . Heart attack Father     Social History Social History   Tobacco Use  . Smoking status: Never Smoker  . Smokeless tobacco: Never Used  Vaping Use  . Vaping Use: Never used  Substance Use Topics  . Alcohol use: No  . Drug use: No    Allergies  Allergen Reactions  . Keflex [Cephalexin] Hives    Hives and itching    Current Outpatient Medications  Medication Sig Dispense Refill  . Cholecalciferol (VITAMIN D) 2000 UNITS CAPS Take 2,000 Units by mouth daily.     . citalopram (CELEXA) 20 MG tablet Take 1 tablet (20 mg total) by mouth daily. 90 tablet 1  . ibuprofen (ADVIL,MOTRIN) 200 MG tablet Take 400-600 mg by mouth every 6 (six) hours as needed for headache or moderate pain.    [name] Glycol-Propyl Glycol (SYSTANE OP) Apply 1 drop to eye 4 (four) times daily as needed (dry eyes).    Marland Kitchen sulfamethoxazole-trimethoprim (BACTRIM DS) 800-160 MG tablet Take  1 tablet by mouth 2 (two) times daily. 20 tablet 1   No current facility-administered medications for this visit.     Physical Exam  Blood pressure (!) 147/97, pulse 75, height 5\' 7"  (1.702 m), weight 170 lb (77.1 kg).  Constitutional: overall normal hygiene, normal nutrition, well developed, normal grooming, normal body habitus. Assistive device:none  Musculoskeletal: gait and station Limp none, muscle tone and strength are normal, no tremors or atrophy is present.  .  Neurological: coordination overall normal.  Deep tendon reflex/nerve stretch intact.  Sensation normal.  Cranial nerves II-XII intact.    Skin:   Normal overall no scars, lesions, ulcers or rashes. No psoriasis.  Psychiatric: Alert and oriented x 3.  Recent memory intact, remote memory unclear.  Normal mood and affect. Well groomed.  Good eye contact.  Cardiovascular: overall no swelling, no varicosities, no edema bilaterally, normal temperatures of the legs and arms, no clubbing, cyanosis and good capillary refill.  Lymphatic: palpation is normal.  Right lower leg with medial hematoma.  She has no redness today, no increased warmth, no pain. ROM of ankle is full.  NV intact.  No limp.  All other systems reviewed and are negative   The patient has been educated about the nature of the problem(s) and counseled on treatment options.  The patient appeared to understand what I have discussed and is in agreement with it.  Encounter Diagnoses  Name Primary?  . Strain of right ankle, initial encounter Yes  . Hematoma of leg, right, initial encounter   . Cellulitis of right leg     PLAN Call if any problems.  Precautions discussed.  Continue current medications.   Return to clinic 2 weeks   Finish up antibiotics.  Electronically Signed Sanjuana Kava, MD 8/3/202110:28 AM

## 2019-08-21 NOTE — Telephone Encounter (Signed)
Give her order for what she wants.

## 2019-09-03 ENCOUNTER — Other Ambulatory Visit: Payer: Self-pay

## 2019-09-03 ENCOUNTER — Encounter: Payer: Self-pay | Admitting: Orthopaedic Surgery

## 2019-09-03 ENCOUNTER — Ambulatory Visit (INDEPENDENT_AMBULATORY_CARE_PROVIDER_SITE_OTHER): Payer: Medicare Other | Admitting: Orthopaedic Surgery

## 2019-09-03 VITALS — BP 125/85 | HR 70 | Ht 66.0 in | Wt 176.2 lb

## 2019-09-03 DIAGNOSIS — S8011XA Contusion of right lower leg, initial encounter: Secondary | ICD-10-CM

## 2019-09-03 DIAGNOSIS — S96911A Strain of unspecified muscle and tendon at ankle and foot level, right foot, initial encounter: Secondary | ICD-10-CM

## 2019-09-03 NOTE — Progress Notes (Signed)
Patient Kristin [name], female DOB:1955-02-17, 64 y.o. SAY:[id number]  Chief Complaint  Patient presents with  . Ankle Pain    R/hurts    HPI  Kristin [name] is a 64 y.o. female who has resolving hematoma of the right lower leg.  She no longer has any redness or cellulitis.  That has resolved.  She still has swelling from the hematoma.  She gets tired at the end of the day.  She had a CAM walker that she returned but she says it might be best to get a new support now.  She has no new trauma.   Body mass index is 28.45 kg/m.  ROS  Review of Systems  Constitutional: Positive for activity change.  Musculoskeletal: Positive for arthralgias, gait problem and joint swelling.  All other systems reviewed and are negative.   All other systems reviewed and are negative.  The following is a summary of the past history medically, past history surgically, known current medicines, social history and family history.  This information is gathered electronically by the computer from prior information and documentation.  I review this each visit and have found including this information at this point in the chart is beneficial and informative.    Past Medical History:  Diagnosis Date  . Adenocarcinoma of colon ([place]) 2008  . Anemia   . 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 Complication of anesthesia    "need childsized tube to go down my throat/dr in 2008" (05/14/2012)  . Hyperlipidemia   . Hyperparathyroidism ([place]) 01/2012  . Hypertension 05/14/2012   hx of being on hypertensive meds off meds since 2017   . Lyme disease 2010  . Peripheral vascular disease (HCC)    varcisoe veins in left leg   . Stroke [facility]) 2013   " Kristin [name] - had since birth "     Past Surgical History:  Procedure Laterality Date  . COLON RESECTION  2008  . COLON SURGERY    . COLONOSCOPY  10/12/2010   Procedure: COLONOSCOPY;  Surgeon: Jamesetta So;  [facility];  Service: Gastroenterology;  Laterality: N/A;  .  COLONOSCOPY N/A 02/23/2016   Procedure: COLONOSCOPY;  Surgeon: Aviva Signs, MD;  [facility];  Service: Gastroenterology;  Laterality: N/A;  . FACIAL COSMETIC SURGERY     eyelids  . PARATHYROIDECTOMY Right 03/30/2017   Procedure: RIGHT INFERIOR PARATHYROIDECTOMY, BIOPSY OF RIGHT SUPERIOR PARATHYROIDECTIMY;  Surgeon: Armandina Gemma, MD;  [facility];  Service: General;  Laterality: Right;  . WISDOM TOOTH EXTRACTION  2000's   "2" (05/14/2012)    Family History  Problem Relation Age of Onset  . Cancer Maternal Grandfather   . Hypertension Mother   . Heart attack Father     Social History Social History   Tobacco Use  . Smoking status: Never Smoker  . Smokeless tobacco: Never Used  Vaping Use  . Vaping Use: Never used  Substance Use Topics  . Alcohol use: No  . Drug use: No    Allergies  Allergen Reactions  . Keflex [Cephalexin] Hives    Hives and itching    Current Outpatient Medications  Medication Sig Dispense Refill  . Cholecalciferol (VITAMIN D) 2000 UNITS CAPS Take 2,000 Units by mouth daily.     . citalopram (CELEXA) 20 MG tablet Take 1 tablet (20 mg total) by mouth daily. 90 tablet 1  . ibuprofen (ADVIL,MOTRIN) 200 MG tablet Take 400-600 mg by mouth every 6 (six) hours as needed for headache or moderate  pain.    . Polyethyl Glycol-Propyl Glycol (SYSTANE OP) Apply 1 drop to eye 4 (four) times daily as needed (dry eyes).    Marland Kitchen sulfamethoxazole-trimethoprim (BACTRIM DS) 800-160 MG tablet Take 1 tablet by mouth 2 (two) times daily. (Patient not taking: Reported on 09/03/2019) 20 tablet 1   No current facility-administered medications for this visit.     Physical Exam  Blood pressure 125/85, pulse 70, height 5\' 6"  (1.676 m), weight 176 lb 4 oz (79.9 kg).  Constitutional: overall normal hygiene, normal nutrition, well developed, normal grooming, normal body habitus. Assistive device:none  Musculoskeletal: gait and station Limp right, muscle tone and  strength are normal, no tremors or atrophy is present.  .  Neurological: coordination overall normal.  Deep tendon reflex/nerve stretch intact.  Sensation normal.  Cranial nerves II-XII intact.   Skin:   Normal overall no scars, lesions, ulcers or rashes. No psoriasis.  Psychiatric: Alert and oriented x 3.  Recent memory intact, remote memory unclear.  Normal mood and affect. Well groomed.  Good eye contact.  Cardiovascular: overall no swelling, no varicosities, no edema bilaterally, normal temperatures of the legs and arms, no clubbing, cyanosis and good capillary refill.  Lymphatic: palpation is normal.  She has resolving hematoma on the medial side of the distal right lower leg.  It is not red.  It is less swollen than before but still present.  All other systems reviewed and are negative   The patient has been educated about the nature of the problem(s) and counseled on treatment options.  The patient appeared to understand what I have discussed and is in agreement with it.  Encounter Diagnoses  Name Primary?  . Strain of right ankle, initial encounter Yes  . Hematoma of leg, right, initial encounter     PLAN Call if any problems.  Precautions discussed.  Continue current medications.   Return to clinic 1 month   CAM walker given.  Electronically Signed Sanjuana Kava, MD 8/17/202110:39 AM

## 2019-09-18 ENCOUNTER — Ambulatory Visit (HOSPITAL_COMMUNITY)
Admission: RE | Admit: 2019-09-18 | Discharge: 2019-09-18 | Disposition: A | Discharge status: Home / Self Care | Payer: Medicare Other | Source: Ambulatory Visit | Attending: Family Medicine | Admitting: Family Medicine

## 2019-09-18 ENCOUNTER — Other Ambulatory Visit: Payer: Self-pay

## 2019-09-18 DIAGNOSIS — Z13820 Encounter for screening for osteoporosis: Secondary | ICD-10-CM | POA: Diagnosis not present

## 2019-09-18 DIAGNOSIS — Z1231 Encounter for screening mammogram for malignant neoplasm of breast: Secondary | ICD-10-CM | POA: Insufficient documentation

## 2019-09-18 DIAGNOSIS — E213 Hyperparathyroidism, unspecified: Secondary | ICD-10-CM | POA: Diagnosis not present

## 2019-09-18 DIAGNOSIS — Z780 Asymptomatic menopausal state: Secondary | ICD-10-CM | POA: Insufficient documentation

## 2019-09-18 DIAGNOSIS — M8580 Other specified disorders of bone density and structure, unspecified site: Secondary | ICD-10-CM | POA: Diagnosis not present

## 2019-10-01 ENCOUNTER — Ambulatory Visit: Payer: Medicare Other | Admitting: Orthopaedic Surgery

## 2020-01-20 ENCOUNTER — Other Ambulatory Visit: Payer: Self-pay | Admitting: *Deleted

## 2020-01-20 ENCOUNTER — Telehealth: Payer: Self-pay | Admitting: Family Medicine

## 2020-01-20 DIAGNOSIS — Z131 Encounter for screening for diabetes mellitus: Secondary | ICD-10-CM

## 2020-01-20 DIAGNOSIS — Z13220 Encounter for screening for lipoid disorders: Secondary | ICD-10-CM

## 2020-01-20 DIAGNOSIS — Z1329 Encounter for screening for other suspected endocrine disorder: Secondary | ICD-10-CM

## 2020-01-20 NOTE — Telephone Encounter (Signed)
Pt returned call and verbalized understanding  

## 2020-01-20 NOTE — Telephone Encounter (Signed)
Orders already put in for future back in July. I released the orders and left message for pt to return call

## 2020-01-20 NOTE — Telephone Encounter (Signed)
Patient has physical on 1/11 and needing labs done 

## 2020-01-24 LAB — CMP14+EGFR
ALT: 41 IU/L — ABNORMAL HIGH (ref 0–32)
AST: 39 IU/L (ref 0–40)
Albumin/Globulin Ratio: 1.4 (ref 1.2–2.2)
Albumin: 3.7 g/dL — ABNORMAL LOW (ref 3.8–4.8)
Alkaline Phosphatase: 91 IU/L (ref 44–121)
BUN/Creatinine Ratio: 13 (ref 12–28)
BUN: 12 mg/dL (ref 8–27)
Bilirubin Total: 0.4 mg/dL (ref 0.0–1.2)
CO2: 20 mmol/L (ref 20–29)
Calcium: 9.1 mg/dL (ref 8.7–10.3)
Chloride: 99 mmol/L (ref 96–106)
Creatinine, Ser: 0.91 mg/dL (ref 0.57–1.00)
GFR calc Af Amer: 77 mL/min/{1.73_m2} (ref >59)
GFR calc non Af Amer: 66 mL/min/{1.73_m2} (ref >59)
Globulin, Total: 2.7 g/dL (ref 1.5–4.5)
Glucose: 107 mg/dL — ABNORMAL HIGH (ref 65–99)
Potassium: 4.2 mmol/L (ref 3.5–5.2)
Sodium: 135 mmol/L (ref 134–144)
Total Protein: 6.4 g/dL (ref 6.0–8.5)

## 2020-01-24 LAB — LIPID PANEL
Chol/HDL Ratio: 4.7 ratio — ABNORMAL HIGH (ref 0.0–4.4)
Cholesterol, Total: 201 mg/dL — ABNORMAL HIGH (ref 100–199)
HDL: 43 mg/dL (ref >39)
LDL Chol Calc (NIH): 134 mg/dL — ABNORMAL HIGH (ref 0–99)
Triglycerides: 132 mg/dL (ref 0–149)
VLDL Cholesterol Cal: 24 mg/dL (ref 5–40)

## 2020-01-24 LAB — TSH: TSH: 3.27 u[IU]/mL (ref 0.450–4.500)

## 2020-01-28 ENCOUNTER — Encounter: Payer: Self-pay | Admitting: Family Medicine

## 2020-01-28 ENCOUNTER — Other Ambulatory Visit: Payer: Self-pay

## 2020-01-28 ENCOUNTER — Ambulatory Visit (INDEPENDENT_AMBULATORY_CARE_PROVIDER_SITE_OTHER): Payer: Medicare Other | Admitting: Family Medicine

## 2020-01-28 VITALS — BP 122/80 | HR 76 | Temp 97.5°F | Ht 64.5 in | Wt 178.8 lb

## 2020-01-28 DIAGNOSIS — F419 Anxiety disorder, unspecified: Secondary | ICD-10-CM | POA: Diagnosis not present

## 2020-01-28 DIAGNOSIS — R748 Abnormal levels of other serum enzymes: Secondary | ICD-10-CM | POA: Diagnosis not present

## 2020-01-28 DIAGNOSIS — Z0000 Encounter for general adult medical examination without abnormal findings: Secondary | ICD-10-CM | POA: Diagnosis not present

## 2020-01-28 MED ORDER — CITALOPRAM HYDROBROMIDE 20 MG PO TABS: 20.0000 mg | ORAL_TABLET | Freq: Every day | ORAL | 1 refills | Status: DC
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
## 2020-01-28 NOTE — Patient Instructions (Addendum)
Increase fresh fruits and vegetables. Avoid alcohol intake Recheck liver function in 6 months.     Preventive Care 66 Years and Older, Female Preventive care refers to lifestyle choices and visits with your health care provider that can promote health and wellness. This includes:  A yearly physical exam. This is also called an annual wellness visit.  Regular dental and eye exams.  Immunizations.  Screening for certain conditions.  Healthy lifestyle choices, such as: ? Eating a healthy diet. ? Getting regular exercise. ? Not using drugs or products that contain nicotine and tobacco. ? Limiting alcohol use. What can I expect for my preventive care visit? Physical exam Your health care provider will check your:  Height and weight. These may be used to calculate your BMI (body mass index). BMI is a measurement that tells if you are at a healthy weight.  Heart rate and blood pressure.  Body temperature.  Skin for abnormal spots. Counseling Your health care provider may ask you questions about your:  Past medical problems.  Family's medical history.  Alcohol, tobacco, and drug use.  Emotional well-being.  Home life and relationship well-being.  Sexual activity.  Diet, exercise, and sleep habits.  History of falls.  Memory and ability to understand (cognition).  Work and work Statistician.  Pregnancy and menstrual history.  Access to firearms. What immunizations do I need? Vaccines are usually given at various ages, according to a schedule. Your health care provider will recommend vaccines for you based on your age, medical history, and lifestyle or other factors, such as travel or where you work.   What tests do I need? Blood tests  Lipid and cholesterol levels. These may be checked every 5 years, or more often depending on your overall health.  Hepatitis C test.  Hepatitis B test. Screening  Lung cancer screening. You may have this screening every year  starting at age 49 if you have a 30-pack-year history of smoking and currently smoke or have quit within the past 15 years.  Colorectal cancer screening. ? All adults should have this screening starting at age 71 and continuing until age 3. ? Your health care provider may recommend screening at age 56 if you are at increased risk. ? You will have tests every 1-10 years, depending on your results and the type of screening test.  Diabetes screening. ? This is done by checking your blood sugar (glucose) after you have not eaten for a while (fasting). ? You may have this done every 1-3 years.  Mammogram. ? This may be done every 1-2 years. ? Talk with your health care provider about how often you should have regular mammograms.  Abdominal aortic aneurysm (AAA) screening. You may need this if you are a current or former smoker.  BRCA-related cancer screening. This may be done if you have a family history of breast, ovarian, tubal, or peritoneal cancers. Other tests  STD (sexually transmitted disease) testing, if you are at risk.  Bone density scan. This is done to screen for osteoporosis. You may have this done starting at age 53. Talk with your health care provider about your test results, treatment options, and if necessary, the need for more tests. Follow these instructions at home: Eating and drinking  Eat a diet that includes fresh fruits and vegetables, whole grains, lean protein, and low-fat dairy products. Limit your intake of foods with high amounts of sugar, saturated fats, and salt.  Take vitamin and mineral supplements as recommended by your health  care provider.  Do not drink alcohol if your health care provider tells you not to drink.  If you drink alcohol: ? Limit how much you have to 0-1 drink a day. ? Be aware of how much alcohol is in your drink. In the [place] one drink equals one 12 oz bottle of beer (355 mL), one 5 oz glass of wine (148 mL), or one 1 oz glass of  hard liquor (44 mL).   Lifestyle  Take daily care of your teeth and gums. Brush your teeth every morning and night with fluoride toothpaste. Floss one time each day.  Stay active. Exercise for at least 30 minutes 5 or more days each week.  Do not use any products that contain nicotine or tobacco, such as cigarettes, e-cigarettes, and chewing tobacco. If you need help quitting, ask your health care provider.  Do not use drugs.  If you are sexually active, practice safe sex. Use a condom or other form of protection in order to prevent STIs (sexually transmitted infections).  Talk with your health care provider about taking a low-dose aspirin or statin.  Find healthy ways to cope with stress, such as: ? Meditation, yoga, or listening to music. ? Journaling. ? Talking to a trusted person. ? Spending time with friends and family. Safety  Always wear your seat belt while driving or riding in a vehicle.  Do not drive: ? If you have been drinking alcohol. Do not ride with someone who has been drinking. ? When you are tired or distracted. ? While texting.  Wear a helmet and other protective equipment during sports activities.  If you have firearms in your house, make sure you follow all gun safety procedures. What's next?  Visit your health care provider once a year for an annual wellness visit.  Ask your health care provider how often you should have your eyes and teeth checked.  Stay up to date on all vaccines. This information is not intended to replace advice given to you by your health care provider. Make sure you discuss any questions you have with your health care provider. Document Revised: 12/25/2019 Document Reviewed: 12/28/2017 Elsevier Patient Education  2021 [place] Eating Plan DASH stands for Dietary Approaches to Stop Hypertension. The DASH eating plan is a healthy eating plan that has been shown to:  Reduce high blood pressure  (hypertension).  Reduce your risk for type 2 diabetes, heart disease, and stroke.  Help with weight loss. What are tips for following this plan? Reading food labels  Check food labels for the amount of salt (sodium) per serving. Choose foods with less than 5 percent of the Daily Value of sodium. Generally, foods with less than 300 milligrams (mg) of sodium per serving fit into this eating plan.  To find whole grains, look for the word "whole" as the first word in the ingredient list. Shopping  Buy products labeled as "low-sodium" or "no salt added."  Buy fresh foods. Avoid canned foods and pre-made or frozen meals. Cooking  Avoid adding salt when cooking. Use salt-free seasonings or herbs instead of table salt or sea salt. Check with your health care provider or pharmacist before using salt substitutes.  Do not fry foods. Cook foods using healthy methods such as baking, boiling, grilling, roasting, and broiling instead.  Cook with heart-healthy oils, such as olive, canola, avocado, soybean, or sunflower oil. Meal planning  Eat a balanced diet that includes: ? 4 or more  servings of fruits and 4 or more servings of vegetables each day. Try to fill one-half of your plate with fruits and vegetables. ? 6-8 servings of whole grains each day. ? Less than 6 oz (170 g) of lean meat, poultry, or fish each day. A 3-oz (85-g) serving of meat is about the same size as a deck of cards. One egg equals 1 oz (28 g). ? 2-3 servings of low-fat dairy each day. One serving is 1 cup (237 mL). ? 1 serving of nuts, seeds, or beans 5 times each week. ? 2-3 servings of heart-healthy fats. Healthy fats called omega-3 fatty acids are found in foods such as walnuts, flaxseeds, fortified milks, and eggs. These fats are also found in cold-water fish, such as sardines, salmon, and mackerel.  Limit how much you eat of: ? Canned or prepackaged foods. ? Food that is high in trans fat, such as some fried  foods. ? Food that is high in saturated fat, such as fatty meat. ? Desserts and other sweets, sugary drinks, and other foods with added sugar. ? Full-fat dairy products.  Do not salt foods before eating.  Do not eat more than 4 egg yolks a week.  Try to eat at least 2 vegetarian meals a week.  Eat more home-cooked food and less restaurant, buffet, and fast food.   Lifestyle  When eating at a restaurant, ask that your food be prepared with less salt or no salt, if possible.  If you drink alcohol: ? Limit how much you use to:  0-1 drink a day for women who are not pregnant.  0-2 drinks a day for men. ? Be aware of how much alcohol is in your drink. In the [place] one drink equals one 12 oz bottle of beer (355 mL), one 5 oz glass of wine (148 mL), or one 1 oz glass of hard liquor (44 mL). General information  Avoid eating more than 2,300 mg of salt a day. If you have hypertension, you may need to reduce your sodium intake to 1,500 mg a day.  Work with your health care provider to maintain a healthy body weight or to lose weight. Ask what an ideal weight is for you.  Get at least 30 minutes of exercise that causes your heart to beat faster (aerobic exercise) most days of the week. Activities may include walking, swimming, or biking.  Work with your health care provider or dietitian to adjust your eating plan to your individual calorie needs. What foods should I eat? Fruits All fresh, dried, or frozen fruit. Canned fruit in natural juice (without added sugar). Vegetables Fresh or frozen vegetables (raw, steamed, roasted, or grilled). Low-sodium or reduced-sodium tomato and vegetable juice. Low-sodium or reduced-sodium tomato sauce and tomato paste. Low-sodium or reduced-sodium canned vegetables. Grains Whole-grain or whole-wheat bread. Whole-grain or whole-wheat pasta. Brown rice. [place]. Bulgur. Whole-grain and low-sodium cereals. Pita bread. Low-fat, low-sodium crackers.  Whole-wheat flour tortillas. Meats and other proteins Skinless chicken or [place]. Ground chicken or [place]. Pork with fat trimmed off. Fish and seafood. Egg whites. Dried beans, peas, or lentils. Unsalted nuts, nut butters, and seeds. Unsalted canned beans. Lean cuts of beef with fat trimmed off. Low-sodium, lean precooked or cured meat, such as sausages or meat loaves. Dairy Low-fat (1%) or fat-free (skim) milk. Reduced-fat, low-fat, or fat-free cheeses. Nonfat, low-sodium ricotta or cottage cheese. Low-fat or nonfat yogurt. Low-fat, low-sodium cheese. Fats and oils Soft margarine without trans fats. Vegetable oil. Reduced-fat, low-fat, or  light mayonnaise and salad dressings (reduced-sodium). Canola, safflower, olive, avocado, soybean, and sunflower oils. Avocado. Seasonings and condiments Herbs. Spices. Seasoning mixes without salt. Other foods Unsalted popcorn and pretzels. Fat-free sweets. The items listed above may not be a complete list of foods and beverages you can eat. Contact a dietitian for more information. What foods should I avoid? Fruits Canned fruit in a light or heavy syrup. Fried fruit. Fruit in cream or butter sauce. Vegetables Creamed or fried vegetables. Vegetables in a cheese sauce. Regular canned vegetables (not low-sodium or reduced-sodium). Regular canned tomato sauce and paste (not low-sodium or reduced-sodium). Regular tomato and vegetable juice (not low-sodium or reduced-sodium). Angie Fava. Olives. Grains Baked goods made with fat, such as croissants, muffins, or some breads. Dry pasta or rice meal packs. Meats and other proteins Fatty cuts of meat. Ribs. Fried meat. Berniece Salines. Bologna, salami, and other precooked or cured meats, such as sausages or meat loaves. Fat from the back of a pig (fatback). Bratwurst. Salted nuts and seeds. Canned beans with added salt. Canned or smoked fish. Whole eggs or egg yolks. Chicken or [place] with skin. Dairy Whole or 2% milk, cream, and  half-and-half. Whole or full-fat cream cheese. Whole-fat or sweetened yogurt. Full-fat cheese. Nondairy creamers. Whipped toppings. Processed cheese and cheese spreads. Fats and oils Butter. Stick margarine. Lard. Shortening. Ghee. Bacon fat. Tropical oils, such as coconut, palm kernel, or palm oil. Seasonings and condiments Onion salt, garlic salt, seasoned salt, table salt, and sea salt. Worcestershire sauce. Tartar sauce. Barbecue sauce. Teriyaki sauce. Soy sauce, including reduced-sodium. Steak sauce. Canned and packaged gravies. Fish sauce. Oyster sauce. Cocktail sauce. Store-bought horseradish. Ketchup. Mustard. Meat flavorings and tenderizers. Bouillon cubes. Hot sauces. Pre-made or packaged marinades. Pre-made or packaged taco seasonings. Relishes. Regular salad dressings. Other foods Salted popcorn and pretzels. The items listed above may not be a complete list of foods and beverages you should avoid. Contact a dietitian for more information. Where to find more information  National Heart, Lung, and Blood Institute: https://wilson-eaton.com/  American Heart Association: www.heart.org  Academy of Nutrition and Dietetics: www.eatright.[place]: www.kidney.org Summary  The DASH eating plan is a healthy eating plan that has been shown to reduce high blood pressure (hypertension). It may also reduce your risk for type 2 diabetes, heart disease, and stroke.  When on the DASH eating plan, aim to eat more fresh fruits and vegetables, whole grains, lean proteins, low-fat dairy, and heart-healthy fats.  With the DASH eating plan, you should limit salt (sodium) intake to 2,300 mg a day. If you have hypertension, you may need to reduce your sodium intake to 1,500 mg a day.  Work with your health care provider or dietitian to adjust your eating plan to your individual calorie needs. This information is not intended to replace advice given to you by your health care provider. Make  sure you discuss any questions you have with your health care provider. Document Revised: 12/07/2018 Document Reviewed: 12/07/2018 Elsevier Patient Education  2021 [employer].

## 2020-02-12 ENCOUNTER — Other Ambulatory Visit: Payer: Self-pay

## 2020-02-12 ENCOUNTER — Encounter: Payer: Self-pay | Admitting: Family Medicine

## 2020-02-12 ENCOUNTER — Telehealth: Payer: Self-pay | Admitting: *Deleted

## 2020-02-12 ENCOUNTER — Ambulatory Visit (INDEPENDENT_AMBULATORY_CARE_PROVIDER_SITE_OTHER): Payer: Medicare Other | Admitting: Family Medicine

## 2020-02-12 ENCOUNTER — Ambulatory Visit (HOSPITAL_COMMUNITY)
Admission: RE | Admit: 2020-02-12 | Discharge: 2020-02-12 | Disposition: A | Discharge status: Home / Self Care | Payer: Medicare Other | Source: Ambulatory Visit | Attending: Family Medicine | Admitting: Family Medicine

## 2020-02-12 VITALS — BP 158/92 | HR 92 | Temp 96.9°F | Wt 175.8 lb

## 2020-02-12 DIAGNOSIS — M79606 Pain in leg, unspecified: Secondary | ICD-10-CM | POA: Insufficient documentation

## 2020-02-12 DIAGNOSIS — Z8679 Personal history of other diseases of the circulatory system: Secondary | ICD-10-CM | POA: Diagnosis not present

## 2020-02-12 NOTE — Telephone Encounter (Signed)
Stat u/s results ready for review

## 2020-02-12 NOTE — Telephone Encounter (Signed)
See result note. Thx dr. Darene Lamer

## 2020-02-12 NOTE — Progress Notes (Signed)
Patient ID: Kristin [name], female    DOB: 08/19/1955, 65 y.o.   MRN: [id number]   Chief Complaint  Patient presents with  . Knee Pain    Patient reports pain in her lower leg that started Sunday. Monday started moving up behind her knee. Red, warm and hurts to touch.    Subjective:    HPI  CC- left knee pain.  Left knee painful.  Started 3 days ago. Has h/o varicose veins, the last few years with itching on the leg.  No long car rides or plane rides,  Fall in summer, where she injured her shin, across the shin on left.  No h/o dvt.  Not on HRTs H/o colon cancer in 2008. Had surgery only. No h/o radiation or chemotherapy. No other immobility form illness or surgery.   Medical History Kristin [name] has a past medical history of Adenocarcinoma of colon ([place]) (2008), Anemia, Cavernoma (04/979), Complication of anesthesia, Hyperlipidemia, Hyperparathyroidism ([place]) (01/2012), Hypertension (05/14/2012), Lyme disease (2010), Peripheral vascular disease ([place]), and Stroke ([place]) (2013).   Outpatient Encounter Medications as of 02/12/2020  Medication Sig  . Cholecalciferol (VITAMIN D) 2000 UNITS CAPS Take 2,000 Units by mouth daily.   . citalopram (CELEXA) 20 MG tablet Take 1 tablet (20 mg total) by mouth daily.  [name] Glycol-Propyl Glycol (SYSTANE OP) Apply 1 drop to eye 4 (four) times daily as needed (dry eyes).   No facility-administered encounter medications on file as of 02/12/2020.     Review of Systems  Constitutional: Negative for chills and fever.  HENT: Negative for congestion, rhinorrhea and sore throat.   Respiratory: Negative for cough, shortness of breath and wheezing.   Cardiovascular: Negative for chest pain and leg swelling.  Gastrointestinal: Negative for abdominal pain, diarrhea, nausea and vomiting.  Genitourinary: Negative for dysuria and frequency.  Musculoskeletal: Positive for arthralgias (left knee pain). Negative for back pain.  Skin: Negative for rash.   Neurological: Negative for dizziness, weakness and headaches.     Vitals BP (!) 158/92   Pulse 92   Temp (!) 96.9 F (36.1 C)   Wt 175 lb 12.8 oz (79.7 kg)   SpO2 95%   BMI 29.71 kg/m   Objective:   Physical Exam Vitals and nursing note reviewed.  Constitutional:      Appearance: Normal appearance.  HENT:     Head: Normocephalic and atraumatic.     Nose: Nose normal.     Mouth/Throat:     Mouth: Mucous membranes are moist.     Pharynx: Oropharynx is clear.  Eyes:     Extraocular Movements: Extraocular movements intact.     Conjunctiva/sclera: Conjunctivae normal.     Pupils: Pupils are equal, round, and reactive to light.  Cardiovascular:     Rate and Rhythm: Normal rate and regular rhythm.     Pulses: Normal pulses.     Heart sounds: Normal heart sounds.  Pulmonary:     Effort: Pulmonary effort is normal.     Breath sounds: Normal breath sounds. No wheezing, rhonchi or rales.  Musculoskeletal:        General: Tenderness (left medial calf, with ttp over varicose vein.  no pain with rom of left knee..  mild erythema and warmth to medial left knee. ) present. Normal range of motion.     Right lower leg: No edema.     Left lower leg: No edema.  Skin:    General: Skin is warm and dry.  Findings: No lesion or rash.  Neurological:     General: No focal deficit present.     Mental Status: She is alert and oriented to person, place, and time.     Cranial Nerves: No cranial nerve deficit.     Motor: No weakness.  Psychiatric:        Mood and Affect: Mood normal.        Behavior: Behavior normal.        Thought Content: Thought content normal.        Judgment: Judgment normal.      Assessment and Plan   1. Pain of lower extremity, unspecified laterality - US Venous Img Lower Unilateral Left  2. History of varicose veins of lower extremity  Pt to take tylenol or ibuprofen, rest and elevate the leg.    Concern for dvt vs. Superficial venous thrombosis.   Stat u/s lower left LE.  Will call pt with results.   F/u prn.

## 2020-02-12 NOTE — Telephone Encounter (Signed)
[facility] called and notified pt of results

## 2020-02-13 ENCOUNTER — Encounter: Payer: Medicare Other | Admitting: Family Medicine

## 2020-02-13 ENCOUNTER — Ambulatory Visit: Payer: Medicare Other | Admitting: Family Medicine

## 2020-02-24 ENCOUNTER — Ambulatory Visit: Payer: Medicare Other | Admitting: Family Medicine

## 2020-07-27 ENCOUNTER — Ambulatory Visit: Payer: Medicare Other | Admitting: Family Medicine

## 2020-07-27 ENCOUNTER — Other Ambulatory Visit: Payer: Self-pay

## 2020-07-27 ENCOUNTER — Ambulatory Visit (INDEPENDENT_AMBULATORY_CARE_PROVIDER_SITE_OTHER): Payer: Medicare Other | Admitting: Family Medicine

## 2020-07-27 ENCOUNTER — Encounter: Payer: Self-pay | Admitting: Family Medicine

## 2020-07-27 ENCOUNTER — Other Ambulatory Visit: Payer: Self-pay | Admitting: Family Medicine

## 2020-07-27 VITALS — BP 148/95 | HR 71 | Temp 93.9°F | Ht 64.5 in | Wt 175.6 lb

## 2020-07-27 DIAGNOSIS — F419 Anxiety disorder, unspecified: Secondary | ICD-10-CM

## 2020-07-27 DIAGNOSIS — I10 Essential (primary) hypertension: Secondary | ICD-10-CM

## 2020-07-27 DIAGNOSIS — Z8673 Personal history of transient ischemic attack (TIA), and cerebral infarction without residual deficits: Secondary | ICD-10-CM | POA: Insufficient documentation

## 2020-07-27 DIAGNOSIS — R7301 Impaired fasting glucose: Secondary | ICD-10-CM | POA: Diagnosis not present

## 2020-07-27 MED ORDER — CITALOPRAM HYDROBROMIDE 20 MG PO TABS: 20.0000 mg | ORAL_TABLET | Freq: Every day | ORAL | 1 refills | Status: DC

## 2020-07-27 MED ORDER — LISINOPRIL 10 MG PO TABS: 10.0000 mg | ORAL_TABLET | Freq: Every day | ORAL | 1 refills | Status: DC

## 2020-07-27 NOTE — Progress Notes (Signed)
Patient ID: Kristin [name], female    DOB: May 27, 1955, 65 y.o.   MRN: [id number]   Chief Complaint  Patient presents with   Anxiety   Subjective:    HPI  Pt here for follow up on anxiety. Pt states she is doing well. Taking Celexa 20 mg daily.   H/o htn, cva- Used to be on pravastatin and labetalol.   Pt doing well and liking the celexa. Anxiety is more controlled.   Impaired fasting gluc- last time.  No symptoms.  HTN Pt not currently on meds.  Used to be on labetalon.  Pt had h/o cva in past. No SEs Denies chest pain, sob, LE swelling, or blurry vision.  Htn- pt had it and was on it in past and had h/o tia/stroke.  HLD- doing well no new concerns.  Compliant with meds. No chest pain, palpitations, myalgias or joint pains.   Medical History Kristin [name] has a past medical history of Adenocarcinoma of colon ([place]) (2008), Anemia, Cavernoma (0/3474), Complication of anesthesia, Hyperlipidemia, Hyperparathyroidism ([place]) (01/2012), Hypertension (05/14/2012), Lyme disease (2010), Peripheral vascular disease ([place]), and Stroke ([place]) (2013).   Outpatient Encounter Medications as of 07/27/2020  Medication Sig   Cholecalciferol (VITAMIN D) 2000 UNITS CAPS Take 2,000 Units by mouth daily.    lisinopril (ZESTRIL) 10 MG tablet Take 1 tablet (10 mg total) by mouth daily.   Polyethyl Glycol-Propyl Glycol (SYSTANE OP) Apply 1 drop to eye 4 (four) times daily as needed (dry eyes).   [DISCONTINUED] citalopram (CELEXA) 20 MG tablet Take 1 tablet (20 mg total) by mouth daily.   citalopram (CELEXA) 20 MG tablet Take 1 tablet (20 mg total) by mouth daily.   No facility-administered encounter medications on file as of 07/27/2020.     Review of Systems  Constitutional:  Negative for chills and fever.  HENT:  Negative for congestion, rhinorrhea and sore throat.   Respiratory:  Negative for cough, shortness of breath and wheezing.   Cardiovascular:  Negative for chest pain and leg swelling.   Gastrointestinal:  Negative for abdominal pain, diarrhea, nausea and vomiting.  Genitourinary:  Negative for dysuria and frequency.  Musculoskeletal:  Negative for arthralgias and back pain.  Skin:  Negative for rash.  Neurological:  Negative for dizziness, weakness and headaches.    Vitals BP (!) 148/95   Pulse 71   Temp (!) 93.9 F (34.4 C)   Ht 5' 4.5" (1.638 m)   Wt 175 lb 9.6 oz (79.7 kg)   SpO2 96%   BMI 29.68 kg/m   Objective:   Physical Exam Vitals and nursing note reviewed.  Constitutional:      General: She is not in acute distress.    Appearance: Normal appearance. She is not ill-appearing.  HENT:     Head: Normocephalic and atraumatic.     Nose: Nose normal.     Mouth/Throat:     Mouth: Mucous membranes are moist.     Pharynx: Oropharynx is clear.  Eyes:     Extraocular Movements: Extraocular movements intact.     Conjunctiva/sclera: Conjunctivae normal.     Pupils: Pupils are equal, round, and reactive to light.  Cardiovascular:     Rate and Rhythm: Normal rate and regular rhythm.     Pulses: Normal pulses.     Heart sounds: Normal heart sounds.  Pulmonary:     Effort: Pulmonary effort is normal.     Breath sounds: Normal breath sounds. No wheezing, rhonchi or rales.  Musculoskeletal:        General: Normal range of motion.     Right lower leg: No edema.     Left lower leg: No edema.  Skin:    General: Skin is warm and dry.     Findings: No lesion or rash.  Neurological:     General: No focal deficit present.     Mental Status: She is alert and oriented to person, place, and time.  Psychiatric:        Mood and Affect: Mood normal.        Behavior: Behavior normal.     Assessment and Plan   1. Primary hypertension - lisinopril (ZESTRIL) 10 MG tablet; Take 1 tablet (10 mg total) by mouth daily.  Dispense: 90 tablet; Refill: 1 - Lipid panel  2. Impaired fasting glucose - CMP14+EGFR - Hemoglobin A1c - Lipid panel  3. History of  cardioembolic cerebrovascular accident (CVA) - Lipid panel  4. Anxiety - citalopram (CELEXA) 20 MG tablet; Take 1 tablet (20 mg total) by mouth daily.  Dispense: 90 tablet; Refill: 1   -gave script for bp cuff.  Ordered labs.   Htn- not controlled. Recommending starting lisinopril.   Hld- with h/o cva- recommending restarting pravastatin.  Ordered labs.  Anxiety- stable. Cont celexa.  Impaired fasting glucose- cont to monitor and dec carbs.  Ordered labs to recheck.  F/u 67mo   Return in about 6 months (around 01/27/2021) for f/u htn, impaired fasting gluc.  BP Readings from Last 3 Encounters:  07/27/20 (!) 148/95  02/12/20 (!) 158/92  01/28/20 122/80   Wt Readings from Last 3 Encounters:  07/27/20 175 lb 9.6 oz (79.7 kg)  02/12/20 175 lb 12.8 oz (79.7 kg)  01/28/20 178 lb 12.8 oz (81.1 kg)

## 2020-08-10 ENCOUNTER — Telehealth: Payer: Self-pay | Admitting: Family Medicine

## 2020-08-10 NOTE — Telephone Encounter (Signed)
Patient states she was unable to get labs last week but does plan on having them done this week.

## 2020-08-10 NOTE — Telephone Encounter (Signed)
Pls call pt to see when can go to get labs ordered last visit. To check cholestrol and kidney function.   Thx.   Dr. Lovena Le

## 2020-08-10 NOTE — Telephone Encounter (Signed)
Left message to return call 

## 2020-08-14 LAB — LIPID PANEL
Chol/HDL Ratio: 4 ratio (ref 0.0–4.4)
Cholesterol, Total: 244 mg/dL — ABNORMAL HIGH (ref 100–199)
HDL: 61 mg/dL (ref >39)
LDL Chol Calc (NIH): 166 mg/dL — ABNORMAL HIGH (ref 0–99)
Triglycerides: 96 mg/dL (ref 0–149)
VLDL Cholesterol Cal: 17 mg/dL (ref 5–40)

## 2020-08-14 LAB — CMP14+EGFR
ALT: 19 IU/L (ref 0–32)
AST: 23 IU/L (ref 0–40)
Albumin/Globulin Ratio: 1.9 (ref 1.2–2.2)
Albumin: 4.3 g/dL (ref 3.8–4.8)
Alkaline Phosphatase: 67 IU/L (ref 44–121)
BUN/Creatinine Ratio: 18 (ref 12–28)
BUN: 14 mg/dL (ref 8–27)
Bilirubin Total: 0.4 mg/dL (ref 0.0–1.2)
CO2: 22 mmol/L (ref 20–29)
Calcium: 10 mg/dL (ref 8.7–10.3)
Chloride: 103 mmol/L (ref 96–106)
Creatinine, Ser: 0.77 mg/dL (ref 0.57–1.00)
Globulin, Total: 2.3 g/dL (ref 1.5–4.5)
Glucose: 81 mg/dL (ref 65–99)
Potassium: 5.3 mmol/L — ABNORMAL HIGH (ref 3.5–5.2)
Sodium: 138 mmol/L (ref 134–144)
Total Protein: 6.6 g/dL (ref 6.0–8.5)
eGFR: 85 mL/min/{1.73_m2} (ref >59)

## 2020-08-14 LAB — HEMOGLOBIN A1C
Est. average glucose Bld gHb Est-mCnc: 126 mg/dL
Hgb A1c MFr Bld: 6 % — ABNORMAL HIGH (ref 4.8–5.6)

## 2020-08-19 ENCOUNTER — Telehealth: Payer: Self-pay | Admitting: Family Medicine

## 2020-08-19 MED ORDER — PRAVASTATIN SODIUM 40 MG PO TABS: 40.0000 mg | ORAL_TABLET | Freq: Every day | ORAL | 1 refills | Status: DC

## 2020-08-19 NOTE — Addendum Note (Signed)
Addended by: Erven Colla on: 08/19/2020 03:00 PM   Modules accepted: Orders

## 2020-08-19 NOTE — Telephone Encounter (Signed)
Lovena Le, Malena M, DO  Vicente Males, LPN Pls have pt f/u with new provider in 4moto recheck cholesterol. Thx. Dr. TLovena Le

## 2021-02-05 ENCOUNTER — Other Ambulatory Visit: Payer: Self-pay | Admitting: Family Medicine

## 2021-02-05 DIAGNOSIS — F419 Anxiety disorder, unspecified: Secondary | ICD-10-CM

## 2021-02-08 NOTE — Telephone Encounter (Signed)
Has appointment 02/09/21

## 2021-02-09 ENCOUNTER — Encounter: Payer: Self-pay | Admitting: Family Medicine

## 2021-02-09 ENCOUNTER — Other Ambulatory Visit: Payer: Self-pay

## 2021-02-09 ENCOUNTER — Ambulatory Visit (INDEPENDENT_AMBULATORY_CARE_PROVIDER_SITE_OTHER): Payer: Medicare Other | Admitting: Family Medicine

## 2021-02-09 VITALS — BP 164/99 | HR 76 | Temp 97.8°F | Wt 179.0 lb

## 2021-02-09 DIAGNOSIS — Z85038 Personal history of other malignant neoplasm of large intestine: Secondary | ICD-10-CM | POA: Diagnosis not present

## 2021-02-09 DIAGNOSIS — E559 Vitamin D deficiency, unspecified: Secondary | ICD-10-CM

## 2021-02-09 DIAGNOSIS — I10 Essential (primary) hypertension: Secondary | ICD-10-CM | POA: Diagnosis not present

## 2021-02-09 DIAGNOSIS — Z130 Encounter for screening for diseases of the blood and blood-forming organs and certain disorders involving the immune mechanism: Secondary | ICD-10-CM

## 2021-02-09 DIAGNOSIS — E785 Hyperlipidemia, unspecified: Secondary | ICD-10-CM

## 2021-02-09 DIAGNOSIS — F419 Anxiety disorder, unspecified: Secondary | ICD-10-CM | POA: Diagnosis not present

## 2021-02-09 DIAGNOSIS — R7303 Prediabetes: Secondary | ICD-10-CM

## 2021-02-09 DIAGNOSIS — Q283 Other malformations of cerebral vessels: Secondary | ICD-10-CM | POA: Insufficient documentation

## 2021-02-09 MED ORDER — PRAVASTATIN SODIUM 40 MG PO TABS: 40.0000 mg | ORAL_TABLET | Freq: Every day | ORAL | 3 refills | Status: DC

## 2021-02-09 MED ORDER — AMLODIPINE BESYLATE 10 MG PO TABS: 10.0000 mg | ORAL_TABLET | Freq: Every day | ORAL | 3 refills | Status: DC

## 2021-02-09 MED ORDER — CITALOPRAM HYDROBROMIDE 20 MG PO TABS: 20.0000 mg | ORAL_TABLET | Freq: Every day | ORAL | 3 refills | Status: DC

## 2021-02-09 NOTE — Assessment & Plan Note (Signed)
Uncontrolled.  Advised compliance with pravastatin.  Refilled today.

## 2021-02-09 NOTE — Patient Instructions (Signed)
Medication as directed.  Check BP daily.  Labs in 1-2 months.  I will arrange referral for colonoscopy.  Follow up in 3 months.  Take care  Dr. Lacinda Axon

## 2021-02-09 NOTE — Assessment & Plan Note (Signed)
Blood pressure is uncontrolled.  Stopping lisinopril.  Starting amlodipine.  Advised to check blood pressure regularly at home.

## 2021-02-09 NOTE — Assessment & Plan Note (Signed)
Due for colonoscopy this year.  Referring to Dr. Arnoldo Morale as he did her prior surgery and her most recent colonoscopy.

## 2021-02-09 NOTE — Assessment & Plan Note (Signed)
Stable.  Continue Celexa.  Refilled today.

## 2021-02-09 NOTE — Progress Notes (Signed)
Subjective:  Patient ID: Kristin [name], female    DOB: Dec 13, 1955  Age: 66 y.o. MRN: [id number]  CC: Chief Complaint  Patient presents with   Establish Care    Pt needs mammogram. Not taking any blood pressure meds; trying to work on BP naturally.     HPI:  66 year old female with a history of hyperparathyroidism/parathyroid adenoma status post right parathyroidectomy, history of colon cancer, osteopenia, hyperlipidemia, anxiety, history of TIA, cerebral cavernoma, hypertension presents for follow-up.  Hypertension is uncontrolled.  Patient has stopped taking her lisinopril.  Patient states that she was trying to manage her blood pressure naturally with diet and exercise.  BP elevated today.  Patient states that she felt like lisinopril gave her issues with her mood.  Patient not taking her pravastatin regularly.  Last lipid panel revealed an LDL of 166.  Given her prior history, needs better control.  We will discuss today.  Patient has a known history of colon cancer.  Had surgical resection in 2008.  Last colonoscopy was in 2018.  She is due for colonoscopy this year.  Patient also states that she needs a mammogram and a refill on her Celexa.  Anxiety is stable.  Patient denies any chest pain or shortness of breath.  She is feeling well.  Patient Active Problem List   Diagnosis Date Noted   Cerebral cavernoma 02/09/2021   History of TIA (transient ischemic attack) 07/27/2020   Osteopenia 04/06/2016   Anxiety 03/09/2015   Hyperparathyroidism ([place]) 06/21/2012   Hyperlipidemia 06/21/2012   HTN (hypertension) 05/14/2012   History of colon cancer 09/02/2010    Social Hx   Social History   Socioeconomic History   Marital status: Married    Spouse name: Not on file   Number of children: Not on file   Years of education: Not on file   Highest education level: Not on file  Occupational History   Not on file  Tobacco Use   Smoking status: Never   Smokeless tobacco: Never   Vaping Use   Vaping Use: Never used  Substance and Sexual Activity   Alcohol use: No   Drug use: No   Sexual activity: Yes  Other Topics Concern   Not on file  Social History Narrative   Not on file   Social Determinants of Health   Financial Resource Strain: Not on file  Food Insecurity: Not on file  Transportation Needs: Not on file  Physical Activity: Not on file  Stress: Not on file  Social Connections: Not on file    Review of Systems  Constitutional: Negative.   Respiratory: Negative.    Cardiovascular: Negative.   Gastrointestinal: Negative.    Objective:  BP (!) 164/99    Pulse 76    Temp 97.8 F (36.6 C)    Wt 179 lb (81.2 kg)    SpO2 98%    BMI 30.25 kg/m   BP/Weight 02/09/2021 07/27/2020 6/59/9357  Systolic BP 017 793 903  Diastolic BP 99 95 92  Wt. (Lbs) 179 175.6 175.8  BMI 30.25 29.68 29.71    Physical Exam Vitals and nursing note reviewed.  Constitutional:      General: She is not in acute distress.    Appearance: Normal appearance. She is not ill-appearing.  HENT:     Head: Normocephalic and atraumatic.  Eyes:     General:        Right eye: No discharge.        Left eye: No  discharge.     Conjunctiva/sclera: Conjunctivae normal.  Cardiovascular:     Rate and Rhythm: Normal rate and regular rhythm.     Heart sounds: No murmur heard. Pulmonary:     Effort: Pulmonary effort is normal.     Breath sounds: Normal breath sounds. No wheezing, rhonchi or rales.  Abdominal:     General: There is no distension.     Palpations: Abdomen is soft.     Tenderness: There is no abdominal tenderness.  Neurological:     Mental Status: She is alert.  Psychiatric:        Mood and Affect: Mood normal.        Behavior: Behavior normal.    Lab Results  Component Value Date   WBC 6.3 03/24/2017   HGB 13.9 03/24/2017   HCT 42.9 03/24/2017   PLT 252 03/24/2017   GLUCOSE 81 08/13/2020   CHOL 244 (H) 08/13/2020   TRIG 96 08/13/2020   HDL 61 08/13/2020    LDLCALC 166 (H) 08/13/2020   ALT 19 08/13/2020   AST 23 08/13/2020   NA 138 08/13/2020   K 5.3 (H) 08/13/2020   CL 103 08/13/2020   CREATININE 0.77 08/13/2020   BUN 14 08/13/2020   CO2 22 08/13/2020   TSH 3.270 01/23/2020   HGBA1C 6.0 (H) 08/13/2020     Assessment & Plan:   Problem List Items Addressed This Visit       Cardiovascular and Mediastinum   HTN (hypertension) - Primary    Blood pressure is uncontrolled.  Stopping lisinopril.  Starting amlodipine.  Advised to check blood pressure regularly at home.       Relevant Medications   amLODipine (NORVASC) 10 MG tablet   pravastatin (PRAVACHOL) 40 MG tablet   Other Relevant Orders   CMP14+EGFR     Other   History of colon cancer    Due for colonoscopy this year.  Referring to Dr. Arnoldo Morale as he did her prior surgery and her most recent colonoscopy.      Relevant Orders   CBC   Ambulatory referral to General Surgery   Hyperlipidemia    Uncontrolled.  Advised compliance with pravastatin.  Refilled today.      Relevant Medications   amLODipine (NORVASC) 10 MG tablet   pravastatin (PRAVACHOL) 40 MG tablet   Other Relevant Orders   Lipid panel   Anxiety    Stable.  Continue Celexa.  Refilled today.      Relevant Medications   citalopram (CELEXA) 20 MG tablet   Other Visit Diagnoses     Vitamin D deficiency       Relevant Orders   Vitamin D (25 hydroxy)   Prediabetes       Relevant Orders   Hemoglobin A1c   Screening for deficiency anemia       Relevant Orders   CBC       Meds ordered this encounter  Medications   citalopram (CELEXA) 20 MG tablet    Sig: Take 1 tablet (20 mg total) by mouth daily.    Dispense:  90 tablet    Refill:  3   amLODipine (NORVASC) 10 MG tablet    Sig: Take 1 tablet (10 mg total) by mouth daily.    Dispense:  90 tablet    Refill:  3   pravastatin (PRAVACHOL) 40 MG tablet    Sig: Take 1 tablet (40 mg total) by mouth daily.    Dispense:  90 tablet  Refill:  3     Follow-up:  Return in about 3 months (around 05/10/2021).  [place]

## 2021-02-11 ENCOUNTER — Ambulatory Visit: Payer: Medicare Other | Admitting: Family Medicine

## 2021-02-18 ENCOUNTER — Encounter: Payer: Self-pay | Admitting: General Surgery

## 2021-02-18 ENCOUNTER — Other Ambulatory Visit: Payer: Self-pay

## 2021-02-18 ENCOUNTER — Ambulatory Visit (INDEPENDENT_AMBULATORY_CARE_PROVIDER_SITE_OTHER): Payer: Medicare Other | Admitting: General Surgery

## 2021-02-18 VITALS — BP 127/80 | HR 87 | Temp 97.4°F | Resp 14 | Ht 64.5 in | Wt 175.0 lb

## 2021-02-18 DIAGNOSIS — Z85038 Personal history of other malignant neoplasm of large intestine: Secondary | ICD-10-CM

## 2021-02-18 MED ORDER — SUTAB 1479-225-188 MG PO TABS: 1.0000 | ORAL_TABLET | Freq: Once | ORAL | 0 refills | Status: AC

## 2021-02-19 NOTE — H&P (Signed)
Kristin [name]; [id number]; Jun 10, 1955   HPI Patient is a 66 year old white female presents back to my care for a follow-up colonoscopy.  Patient has a history of colon carcinoma and requires every 5-year colonoscopy.  Her last colonoscopy in 2017 was unremarkable.  Patient denies any blood in her stools.  She denies any recent weight loss, diarrhea, or constipation. Past Medical History:  Diagnosis Date   Adenocarcinoma of colon ([place]) 2008   Anemia    Cavernoma 08/6576   Complication of anesthesia    "need childsized tube to go down my throat/dr in 2008" (05/14/2012)   Hyperlipidemia    Hyperparathyroidism ([place]) 01/2012   Hypertension 05/14/2012   hx of being on hypertensive meds off meds since 2017    Lyme disease 2010   Peripheral vascular disease ([place])    varcisoe veins in left leg    Stroke [facility]) 2013   " [place] - had since birth "     Past Surgical History:  Procedure Laterality Date   COLON RESECTION  2008   COLON SURGERY     COLONOSCOPY  10/12/2010   Procedure: COLONOSCOPY;  Surgeon: Jamesetta So;  Location: [facility];  Service: Gastroenterology;  Laterality: N/A;   COLONOSCOPY N/A 02/23/2016   Procedure: COLONOSCOPY;  Surgeon: Aviva Signs, MD;  Location: [facility];  Service: Gastroenterology;  Laterality: N/A;   FACIAL COSMETIC SURGERY     eyelids   PARATHYROIDECTOMY Right 03/30/2017   Procedure: RIGHT INFERIOR PARATHYROIDECTOMY, BIOPSY OF RIGHT SUPERIOR PARATHYROIDECTIMY;  Surgeon: Armandina Gemma, MD;  Location: [facility];  Service: General;  Laterality: Right;   WISDOM TOOTH EXTRACTION  2000's   "2" (05/14/2012)    Family History  Problem Relation Age of Onset   Cancer Maternal Grandfather    Hypertension Mother    Heart attack Father     Current Outpatient Medications on File Prior to Visit  Medication Sig Dispense Refill   amLODipine (NORVASC) 10 MG tablet Take 1 tablet (10 mg total) by mouth daily. 90 tablet 3   Cholecalciferol (VITAMIN D) 2000 UNITS  CAPS Take 2,000 Units by mouth daily.      citalopram (CELEXA) 20 MG tablet Take 1 tablet (20 mg total) by mouth daily. 90 tablet 3   Cyanocobalamin (VITAMIN B 12 PO) Take by mouth.     pravastatin (PRAVACHOL) 40 MG tablet Take 1 tablet (40 mg total) by mouth daily. 90 tablet 3   No current facility-administered medications on file prior to visit.    Allergies  Allergen Reactions   Keflex [Cephalexin] Hives    Hives and itching    Social History   Substance and Sexual Activity  Alcohol Use No    Social History   Tobacco Use  Smoking Status Never  Smokeless Tobacco Never    Review of Systems  Constitutional: Negative.   HENT: Negative.    Eyes: Negative.   Respiratory: Negative.    Cardiovascular: Negative.   Gastrointestinal: Negative.   Genitourinary: Negative.   Musculoskeletal: Negative.   Skin: Negative.   Neurological: Negative.   Endo/Heme/Allergies: Negative.   Psychiatric/Behavioral: Negative.     Objective   Vitals:   02/18/21 1104  BP: 127/80  Pulse: 87  Resp: 14  Temp: (!) 97.4 F (36.3 C)  SpO2: 94%    Physical Exam Vitals reviewed.  Constitutional:      Appearance: Normal appearance. She is not ill-appearing.  HENT:     Head: Normocephalic and atraumatic.  Cardiovascular:  Rate and Rhythm: Normal rate and regular rhythm.     Heart sounds: Normal heart sounds. No murmur heard.   No friction rub. No gallop.  Pulmonary:     Effort: No respiratory distress.     Breath sounds: Normal breath sounds. No stridor. No wheezing, rhonchi or rales.  Abdominal:     General: Bowel sounds are normal. There is no distension.     Palpations: Abdomen is soft. There is no mass.     Tenderness: There is no abdominal tenderness. There is no guarding or rebound.     Hernia: No hernia is present.  Skin:    General: Skin is warm and dry.  Neurological:     Mental Status: She is alert and oriented to person, place, and time.   Previous colonoscopy  report reviewed Assessment  History of colon cancer, need for follow-up colonoscopy Plan  Patient is scheduled for a colonoscopy on 03/04/2021.  The risks and benefits of the procedure including bleeding and perforation were fully explained to the patient, who gave informed consent.  Sutabs have been prescribed for bowel preparation.

## 2021-02-19 NOTE — Progress Notes (Signed)
Kristin [name]; [id number]; October 04, 1955   HPI Patient is a 66 year old white female presents back to my care for a follow-up colonoscopy.  Patient has a history of colon carcinoma and requires every 5-year colonoscopy.  Her last colonoscopy in 2017 was unremarkable.  Patient denies any blood in her stools.  She denies any recent weight loss, diarrhea, or constipation. Past Medical History:  Diagnosis Date   Adenocarcinoma of colon ([place]) 2008   Anemia    Cavernoma 01/7492   Complication of anesthesia    "need childsized tube to go down my throat/dr in 2008" (05/14/2012)   Hyperlipidemia    Hyperparathyroidism ([place]) 01/2012   Hypertension 05/14/2012   hx of being on hypertensive meds off meds since 2017    Lyme disease 2010   Peripheral vascular disease ([place])    varcisoe veins in left leg    Stroke [facility]) 2013   " [place] - had since birth "     Past Surgical History:  Procedure Laterality Date   COLON RESECTION  2008   COLON SURGERY     COLONOSCOPY  10/12/2010   Procedure: COLONOSCOPY;  Surgeon: Jamesetta So;  [facility];  Service: Gastroenterology;  Laterality: N/A;   COLONOSCOPY N/A 02/23/2016   Procedure: COLONOSCOPY;  Surgeon: Aviva Signs, MD;  Location: [facility];  Service: Gastroenterology;  Laterality: N/A;   FACIAL COSMETIC SURGERY     eyelids   PARATHYROIDECTOMY Right 03/30/2017   Procedure: RIGHT INFERIOR PARATHYROIDECTOMY, BIOPSY OF RIGHT SUPERIOR PARATHYROIDECTIMY;  Surgeon: Armandina Gemma, MD;  Location: [facility];  Service: General;  Laterality: Right;   WISDOM TOOTH EXTRACTION  2000's   "2" (05/14/2012)    Family History  Problem Relation Age of Onset   Cancer Maternal Grandfather    Hypertension Mother    Heart attack Father     Current Outpatient Medications on File Prior to Visit  Medication Sig Dispense Refill   amLODipine (NORVASC) 10 MG tablet Take 1 tablet (10 mg total) by mouth daily. 90 tablet 3   Cholecalciferol (VITAMIN D) 2000 UNITS  CAPS Take 2,000 Units by mouth daily.      citalopram (CELEXA) 20 MG tablet Take 1 tablet (20 mg total) by mouth daily. 90 tablet 3   Cyanocobalamin (VITAMIN B 12 PO) Take by mouth.     pravastatin (PRAVACHOL) 40 MG tablet Take 1 tablet (40 mg total) by mouth daily. 90 tablet 3   No current facility-administered medications on file prior to visit.    Allergies  Allergen Reactions   Keflex [Cephalexin] Hives    Hives and itching    Social History   Substance and Sexual Activity  Alcohol Use No    Social History   Tobacco Use  Smoking Status Never  Smokeless Tobacco Never    Review of Systems  Constitutional: Negative.   HENT: Negative.    Eyes: Negative.   Respiratory: Negative.    Cardiovascular: Negative.   Gastrointestinal: Negative.   Genitourinary: Negative.   Musculoskeletal: Negative.   Skin: Negative.   Neurological: Negative.   Endo/Heme/Allergies: Negative.   Psychiatric/Behavioral: Negative.     Objective   Vitals:   02/18/21 1104  BP: 127/80  Pulse: 87  Resp: 14  Temp: (!) 97.4 F (36.3 C)  SpO2: 94%    Physical Exam Vitals reviewed.  Constitutional:      Appearance: Normal appearance. She is not ill-appearing.  HENT:     Head: Normocephalic and atraumatic.  Cardiovascular:  Rate and Rhythm: Normal rate and regular rhythm.     Heart sounds: Normal heart sounds. No murmur heard.   No friction rub. No gallop.  Pulmonary:     Effort: No respiratory distress.     Breath sounds: Normal breath sounds. No stridor. No wheezing, rhonchi or rales.  Abdominal:     General: Bowel sounds are normal. There is no distension.     Palpations: Abdomen is soft. There is no mass.     Tenderness: There is no abdominal tenderness. There is no guarding or rebound.     Hernia: No hernia is present.  Skin:    General: Skin is warm and dry.  Neurological:     Mental Status: She is alert and oriented to person, place, and time.   Previous colonoscopy  report reviewed Assessment  History of colon cancer, need for follow-up colonoscopy Plan  Patient is scheduled for a colonoscopy on 03/04/2021.  The risks and benefits of the procedure including bleeding and perforation were fully explained to the patient, who gave informed consent.  Sutabs have been prescribed for bowel preparation.

## 2021-03-04 ENCOUNTER — Other Ambulatory Visit: Payer: Self-pay

## 2021-03-04 ENCOUNTER — Encounter (HOSPITAL_COMMUNITY): Payer: Self-pay | Admitting: General Surgery

## 2021-03-04 ENCOUNTER — Ambulatory Visit (HOSPITAL_COMMUNITY): Payer: Medicare Other | Admitting: Anesthesiology

## 2021-03-04 ENCOUNTER — Ambulatory Visit (HOSPITAL_BASED_OUTPATIENT_CLINIC_OR_DEPARTMENT_OTHER): Payer: Medicare Other | Admitting: Anesthesiology

## 2021-03-04 ENCOUNTER — Ambulatory Visit (HOSPITAL_COMMUNITY)
Admission: RE | Admit: 2021-03-04 | Discharge: 2021-03-04 | Disposition: A | Discharge status: Home / Self Care | Payer: Medicare Other | Attending: General Surgery | Admitting: General Surgery

## 2021-03-04 ENCOUNTER — Encounter (HOSPITAL_COMMUNITY)
Admission: RE | Disposition: A | Discharge status: Home / Self Care | Payer: Self-pay | Source: Home / Self Care | Attending: General Surgery

## 2021-03-04 DIAGNOSIS — D649 Anemia, unspecified: Secondary | ICD-10-CM

## 2021-03-04 DIAGNOSIS — I739 Peripheral vascular disease, unspecified: Secondary | ICD-10-CM | POA: Diagnosis not present

## 2021-03-04 DIAGNOSIS — I10 Essential (primary) hypertension: Secondary | ICD-10-CM | POA: Insufficient documentation

## 2021-03-04 DIAGNOSIS — Z1211 Encounter for screening for malignant neoplasm of colon: Secondary | ICD-10-CM

## 2021-03-04 DIAGNOSIS — K6389 Other specified diseases of intestine: Secondary | ICD-10-CM | POA: Diagnosis not present

## 2021-03-04 DIAGNOSIS — F419 Anxiety disorder, unspecified: Secondary | ICD-10-CM | POA: Diagnosis not present

## 2021-03-04 DIAGNOSIS — Z85038 Personal history of other malignant neoplasm of large intestine: Secondary | ICD-10-CM | POA: Insufficient documentation

## 2021-03-04 DIAGNOSIS — E785 Hyperlipidemia, unspecified: Secondary | ICD-10-CM | POA: Diagnosis not present

## 2021-03-04 HISTORY — PX: COLONOSCOPY WITH PROPOFOL: SHX5780

## 2021-03-04 SURGERY — COLONOSCOPY WITH PROPOFOL
Anesthesia: General

## 2021-03-04 MED ORDER — LACTATED RINGERS IV SOLN: INTRAVENOUS | Status: DC

## 2021-03-04 MED ORDER — CHLORHEXIDINE GLUCONATE CLOTH 2 % EX PADS: 6.0000 | MEDICATED_PAD | Freq: Once | CUTANEOUS | Status: DC

## 2021-03-04 MED ORDER — LACTATED RINGERS IV SOLN: INTRAVENOUS | Status: DC | PRN

## 2021-03-04 MED ORDER — LIDOCAINE HCL (PF) 2 % IJ SOLN
INTRAMUSCULAR | Status: AC
  Filled 2021-03-04: qty 5

## 2021-03-04 MED ORDER — PROPOFOL 500 MG/50ML IV EMUL
INTRAVENOUS | Status: DC | PRN
  Administered 2021-03-04: 200 ug/kg/min via INTRAVENOUS

## 2021-03-04 MED ORDER — LIDOCAINE 2% (20 MG/ML) 5 ML SYRINGE
INTRAMUSCULAR | Status: DC | PRN
  Administered 2021-03-04: 50 mg via INTRAVENOUS

## 2021-03-04 MED ORDER — PROPOFOL 500 MG/50ML IV EMUL
INTRAVENOUS | Status: AC
  Filled 2021-03-04: qty 50

## 2021-03-04 MED ORDER — PROPOFOL 10 MG/ML IV BOLUS
INTRAVENOUS | Status: DC | PRN
  Administered 2021-03-04 (×2): 10 mg via INTRAVENOUS
  Administered 2021-03-04: 70 mg via INTRAVENOUS

## 2021-03-04 NOTE — Anesthesia Postprocedure Evaluation (Signed)
Anesthesia Post Note  Patient: Kristin [name]  Procedure(s) Performed: COLONOSCOPY WITH PROPOFOL  Patient location during evaluation: Phase II Anesthesia Type: General Level of consciousness: awake Pain management: pain level controlled Vital Signs Assessment: post-procedure vital signs reviewed and stable Respiratory status: spontaneous breathing and respiratory function stable Cardiovascular status: blood pressure returned to baseline and stable Postop Assessment: no headache and no apparent nausea or vomiting Anesthetic complications: no Comments: Late entry   No notable events documented.   Last Vitals:  Vitals:   03/04/21 0744 03/04/21 0749  BP: (!) 97/58 112/62  Pulse: 81   Resp: 18   Temp: 36.6 C   SpO2: 97%     Last Pain:  Vitals:   03/04/21 0744  TempSrc: Oral  PainSc: 0-No pain                 Louann Sjogren

## 2021-03-04 NOTE — Op Note (Signed)
[facility] Patient Name: Kristin [name] Procedure Date: 03/04/2021 7:03 AM MRN: [id number] Date of Birth: [birth year] Attending MD: Aviva Signs , MD CSN: [id number] Age: 66 Admit Type: Outpatient Procedure:                Colonoscopy Indications:              High risk colon cancer surveillance: Personal                            history of colon cancer Providers:                Aviva Signs, MD, Crystal Page, Aram Candela Referring MD:              Medicines:                Propofol per Anesthesia Complications:            No immediate complications. Estimated Blood Loss:     Estimated blood loss: none. Procedure:                Pre-Anesthesia Assessment:                           - Prior to the procedure, a History and Physical                            was performed, and patient medications and                            allergies were reviewed. The patient is competent.                            The risks and benefits of the procedure and the                            sedation options and risks were discussed with the                            patient. All questions were answered and informed                            consent was obtained. Patient identification and                            proposed procedure were verified by the physician,                            the nurse, the anesthetist and the technician in                            the endoscopy suite. Mental Status Examination:                            alert and oriented. Airway Examination: normal  oropharyngeal airway and neck mobility. Respiratory                            Examination: clear to auscultation. CV Examination:                            RRR, no murmurs, no S3 or S4. Prophylactic                            Antibiotics: The patient does not require                            prophylactic antibiotics. Prior Anticoagulants: The                            patient  has taken no previous anticoagulant or                            antiplatelet agents. ASA Grade Assessment: II - A                            patient with mild systemic disease. After reviewing                            the risks and benefits, the patient was deemed in                            satisfactory condition to undergo the procedure.                            The anesthesia plan was to use monitored anesthesia                            care (MAC). Immediately prior to administration of                            medications, the patient was re-assessed for                            adequacy to receive sedatives. The heart rate,                            respiratory rate, oxygen saturations, blood                            pressure, adequacy of pulmonary ventilation, and                            response to care were monitored throughout the                            procedure. The physical status of the patient was  re-assessed after the procedure.                           After obtaining informed consent, the colonoscope                            was passed under direct vision. Throughout the                            procedure, the patient's blood pressure, pulse, and                            oxygen saturations were monitored continuously. The                            [id number]) scope was introduced through the                            anus and advanced to the the ileocolonic                            anastomosis. No anatomical landmarks were                            photographed. The entire colon was well visualized.                            The colonoscopy was performed without difficulty.                            The patient tolerated the procedure well. The                            quality of the bowel preparation was adequate. The                            total duration of the procedure was 10 minutes. Scope  In: 7:33:42 AM Scope Out: 7:39:16 AM Scope Withdrawal Time: 0 hours 2 minutes 40 seconds  Total Procedure Duration: 0 hours 5 minutes 34 seconds  Findings:      The perianal and digital rectal examinations were normal.      The entire examined colon appeared normal on direct and retroflexion       views. Anastomosis widely patent at ileocolic juncture. No lesions in       terminal ileum. Impression:               - The entire examined colon is normal on direct and                            retroflexion views.                           - No specimens collected. Moderate Sedation:      Moderate (conscious) sedation was personally administered by an       anesthesia professional. The following parameters were  monitored: oxygen       saturation, heart rate, blood pressure, and response to care. Recommendation:           - Written discharge instructions were provided to                            the patient.                           - The signs and symptoms of potential delayed                            complications were discussed with the patient.                           - Patient has a contact number available for                            emergencies.                           - Return to normal activities tomorrow.                           - Resume previous diet.                           - Continue present medications.                           - Repeat colonoscopy in 5 years for surveillance. Procedure Code(s):        --- Professional ---                           [phone], Colonoscopy, flexible; diagnostic, including                            collection of specimen(s) by brushing or washing,                            when performed (separate procedure) Diagnosis Code(s):        --- Professional ---                           Q22.979, Personal history of other malignant                            neoplasm of large intestine CPT copyright 2019 American Medical Association. All  rights reserved. The codes documented in this report are preliminary and upon coder review may  be revised to meet current compliance requirements. Aviva Signs, MD Aviva Signs, MD 03/04/2021 7:44:40 AM This report has been signed electronically. Number of Addenda: 0

## 2021-03-04 NOTE — Transfer of Care (Signed)
Immediate Anesthesia Transfer of Care Note  Patient: Kristin [name]  Procedure(s) Performed: COLONOSCOPY WITH PROPOFOL  Patient Location: Endoscopy Unit  Anesthesia Type:MAC  Level of Consciousness: awake, alert , oriented and patient cooperative  Airway & Oxygen Therapy: Patient Spontanous Breathing  Post-op Assessment: Report given to RN, Post -op Vital signs reviewed and stable and Patient moving all extremities  Post vital signs: Reviewed and stable  Last Vitals:  Vitals Value Taken Time  BP    Temp    Pulse    Resp    SpO2      Last Pain:  Vitals:   03/04/21 0730  TempSrc:   PainSc: 0-No pain      Patients Stated Pain Goal: 8 (61/68/37 2902)  Complications: No notable events documented.

## 2021-03-04 NOTE — Interval H&P Note (Signed)
History and Physical Interval Note:  03/04/2021 7:22 AM  Kristin [name]  has presented today for surgery, with the diagnosis of History of colon cancer.  The various methods of treatment have been discussed with the patient and family. After consideration of risks, benefits and other options for treatment, the patient has consented to  Procedure(s) with comments: COLONOSCOPY WITH PROPOFOL (N/A) - OK per Threasa Beards as a surgical intervention.  The patient's history has been reviewed, patient examined, no change in status, stable for surgery.  I have reviewed the patient's chart and labs.  Questions were answered to the patient's satisfaction.     Aviva Signs

## 2021-03-04 NOTE — Anesthesia Preprocedure Evaluation (Signed)
Anesthesia Evaluation  Patient identified by MRN, date of birth, ID band Patient awake    Reviewed: Allergy & Precautions, H&P , NPO status , Patient's Chart, lab work & pertinent test results, reviewed documented beta blocker date and time   History of Anesthesia Complications (+) history of anesthetic complications (Requires small ETT)  Airway Mallampati: II  TM Distance: >3 FB Neck ROM: full    Dental no notable dental hx.    Pulmonary neg pulmonary ROS,    Pulmonary exam normal breath sounds clear to auscultation       Cardiovascular Exercise Tolerance: Good hypertension, + Peripheral Vascular Disease   Rhythm:regular Rate:Normal     Neuro/Psych PSYCHIATRIC DISORDERS Anxiety CVA    GI/Hepatic negative GI ROS, Neg liver ROS,   Endo/Other  negative endocrine ROS  Renal/GU negative Renal ROS  negative genitourinary   Musculoskeletal   Abdominal   Peds  Hematology  (+) Blood dyscrasia, anemia ,   Anesthesia Other Findings   Reproductive/Obstetrics negative OB ROS                             Anesthesia Physical Anesthesia Plan  ASA: 3  Anesthesia Plan: General   Post-op Pain Management:    Induction:   PONV Risk Score and Plan: Propofol infusion  Airway Management Planned:   Additional Equipment:   Intra-op Plan:   Post-operative Plan:   Informed Consent: I have reviewed the patients History and Physical, chart, labs and discussed the procedure including the risks, benefits and alternatives for the proposed anesthesia with the patient or authorized representative who has indicated his/her understanding and acceptance.     Dental Advisory Given  Plan Discussed with: CRNA  Anesthesia Plan Comments:         Anesthesia Quick Evaluation

## 2021-03-08 ENCOUNTER — Encounter (HOSPITAL_COMMUNITY): Payer: Self-pay | Admitting: General Surgery

## 2021-04-22 ENCOUNTER — Other Ambulatory Visit: Payer: Self-pay | Admitting: Family Medicine

## 2021-05-10 ENCOUNTER — Ambulatory Visit: Payer: Medicare Other | Admitting: Family Medicine

## 2021-05-13 ENCOUNTER — Ambulatory Visit
Admission: EM | Admit: 2021-05-13 | Discharge: 2021-05-13 | Disposition: A | Discharge status: Home / Self Care | Payer: Medicare Other | Attending: Family Medicine | Admitting: Family Medicine

## 2021-05-13 DIAGNOSIS — J069 Acute upper respiratory infection, unspecified: Secondary | ICD-10-CM | POA: Diagnosis not present

## 2021-05-13 MED ORDER — HYDROCODONE BIT-HOMATROP MBR 5-1.5 MG/5ML PO SOLN: 5.0000 mL | Freq: Four times a day (QID) | ORAL | 0 refills | Status: DC | PRN

## 2021-05-13 NOTE — ED Provider Notes (Signed)
?[facility] ? ? ?[phone] ?05/13/21 Arrival Time: 7824 ? ?ASSESSMENT & PLAN: ? ?1. Viral URI with cough   ? ?Discussed typical duration of viral illnesses. ?No resp distress. ?OTC symptom care as needed. ? ?New Prescriptions  ? HYDROCODONE BIT-HOMATROPINE (HYCODAN) 5-1.5 MG/5ML SYRUP    Take 5 mLs by mouth every 6 (six) hours as needed for cough.  ? ? ? Follow-up Information   ? ? Cook, Barwick G, DO.   ?Specialty: Family Medicine ?Why: As needed. ?Contact information: ?[address] ?[address] ?[place] [address] ?[phone] ? ? ?  ?  ? ?  ?  ? ?  ? ? ?Reviewed expectations re: course of current medical issues. Questions answered. ?Outlined signs and symptoms indicating need for more acute intervention. ?Understanding verbalized. ?After Visit Summary given. ? ? ?SUBJECTIVE: ?History from: Patient. ?Kristin [name] is a 66 y.o. female. Reports: cough, nasal congestion, fatigue; x 4 days; abrupt onset. Denies: fever. Normal PO intake without n/v/d. ? ?OBJECTIVE: ? ?Vitals:  ? 05/13/21 1401  ?BP: 130/81  ?Pulse: 74  ?Resp: 18  ?Temp: 98.3 ?F (36.8 ?C)  ?TempSrc: Oral  ?SpO2: 98%  ?  ?General appearance: alert; no distress ?Eyes: PERRLA; EOMI; conjunctiva normal ?HENT: [place]; AT; with nasal congestion ?Neck: supple  ?Lungs: speaks full sentences without difficulty; unlabored; CTAB; dry cough ?Extremities: no edema ?Skin: warm and dry ?Neurologic: normal gait ?Psychological: alert and cooperative; normal mood and affect ? ?Allergies  ?Allergen Reactions  ? Keflex [Cephalexin] Hives and Itching  ? ? ?Past Medical History:  ?Diagnosis Date  ? Adenocarcinoma of colon [facility]) 2008  ? Anemia   ? Cavernoma 04/2012  ? Complication of anesthesia   ? "need childsized tube to go down my throat/dr in 2008" (05/14/2012)  ? Hyperlipidemia   ? Hyperparathyroidism ([place]) 01/2012  ? Hypertension 05/14/2012  ? hx of being on hypertensive meds off meds since 2017   ? Lyme disease 2010  ? Peripheral vascular disease ([place])   ? varcisoe  veins in left leg   ? Stroke [facility]) 2013  ? " [place] - had since birth "   ? ?Social History  ? ?Socioeconomic History  ? Marital status: Married  ?  Spouse name: Not on file  ? Number of children: Not on file  ? Years of education: Not on file  ? Highest education level: Not on file  ?Occupational History  ? Not on file  ?Tobacco Use  ? Smoking status: Never  ? Smokeless tobacco: Never  ?Vaping Use  ? Vaping Use: Never used  ?Substance and Sexual Activity  ? Alcohol use: No  ? Drug use: No  ? Sexual activity: Yes  ?Other Topics Concern  ? Not on file  ?Social History Narrative  ? Not on file  ? ?Social Determinants of Health  ? ?Financial Resource Strain: Not on file  ?Food Insecurity: Not on file  ?Transportation Needs: Not on file  ?Physical Activity: Not on file  ?Stress: Not on file  ?Social Connections: Not on file  ?Intimate Partner Violence: Not on file  ? ?Family History  ?Problem Relation Age of Onset  ? Cancer Maternal Grandfather   ? Hypertension Mother   ? Heart attack Father   ? ?Past Surgical History:  ?Procedure Laterality Date  ? COLON RESECTION  2008  ? COLON SURGERY    ? COLONOSCOPY  10/12/2010  ? Procedure: COLONOSCOPY;  Surgeon: Jamesetta So;  [facility];  Service: Gastroenterology;  Laterality: N/A;  ? COLONOSCOPY N/A 02/23/2016  ? Procedure: COLONOSCOPY;  Surgeon: Aviva Signs, MD;  [facility];  Service: Gastroenterology;  Laterality: N/A;  ? COLONOSCOPY WITH PROPOFOL N/A 03/04/2021  ? Procedure: COLONOSCOPY WITH PROPOFOL;  Surgeon: Aviva Signs, MD;  [facility];  Service: Gastroenterology;  Laterality: N/A;  OK per Threasa Beards  ? FACIAL COSMETIC SURGERY    ? eyelids  ? PARATHYROIDECTOMY Right 03/30/2017  ? Procedure: RIGHT INFERIOR PARATHYROIDECTOMY, BIOPSY OF RIGHT SUPERIOR PARATHYROIDECTIMY;  Surgeon: Armandina Gemma, MD;  [facility];  Service: General;  Laterality: Right;  ? WISDOM TOOTH EXTRACTION  2000's  ? "2" (05/14/2012)  ? ?  ?Vanessa Kick,  MD ?05/13/21 1440 ? ?

## 2021-05-13 NOTE — Discharge Instructions (Signed)
Be aware, your cough medication may cause drowsiness. Please do not drive, operate heavy machinery or make important decisions while on this medication, it can cloud your judgement.  

## 2021-05-13 NOTE — ED Triage Notes (Signed)
Pt reports cough, nasal congestion and fatigue  x 5 days; discharge in left ye since this morning. ?

## 2021-05-21 ENCOUNTER — Ambulatory Visit: Payer: Medicare Other | Admitting: Family Medicine

## 2021-09-09 ENCOUNTER — Other Ambulatory Visit: Payer: Self-pay | Admitting: *Deleted

## 2021-09-09 DIAGNOSIS — F419 Anxiety disorder, unspecified: Secondary | ICD-10-CM

## 2021-09-09 MED ORDER — CITALOPRAM HYDROBROMIDE 20 MG PO TABS: 20.0000 mg | ORAL_TABLET | Freq: Every day | ORAL | 0 refills | Status: DC

## 2021-09-24 ENCOUNTER — Other Ambulatory Visit: Payer: Self-pay | Admitting: *Deleted

## 2021-09-24 DIAGNOSIS — Z1231 Encounter for screening mammogram for malignant neoplasm of breast: Secondary | ICD-10-CM

## 2021-10-06 ENCOUNTER — Inpatient Hospital Stay (HOSPITAL_COMMUNITY): Admission: RE | Admit: 2021-10-06 | Payer: Medicare Other | Source: Ambulatory Visit

## 2021-10-13 ENCOUNTER — Ambulatory Visit (HOSPITAL_COMMUNITY)
Admission: RE | Admit: 2021-10-13 | Discharge: 2021-10-13 | Disposition: A | Discharge status: Home / Self Care | Payer: Medicare Other | Source: Ambulatory Visit | Attending: Family Medicine | Admitting: Family Medicine

## 2021-10-13 DIAGNOSIS — Z1231 Encounter for screening mammogram for malignant neoplasm of breast: Secondary | ICD-10-CM | POA: Insufficient documentation

## 2021-10-25 ENCOUNTER — Other Ambulatory Visit: Payer: Self-pay

## 2021-10-25 MED ORDER — PRAVASTATIN SODIUM 40 MG PO TABS: ORAL_TABLET | ORAL | 0 refills | Status: DC

## 2021-11-11 ENCOUNTER — Ambulatory Visit (INDEPENDENT_AMBULATORY_CARE_PROVIDER_SITE_OTHER): Payer: Medicare Other

## 2021-11-11 VITALS — Ht 64.5 in | Wt 174.0 lb

## 2021-11-11 DIAGNOSIS — Z0000 Encounter for general adult medical examination without abnormal findings: Secondary | ICD-10-CM | POA: Diagnosis not present

## 2021-11-11 NOTE — Patient Instructions (Signed)
Kristin [name] , Thank you for taking time to come for your Medicare Wellness Visit. I appreciate your ongoing commitment to your health goals. Please review the following plan we discussed and let me know if I can assist you in the future.   Screening recommendations/referrals: Colonoscopy: 03/04/21 Mammogram: 10/13/21 Bone Density: 09/18/19, every 5 years Recommended yearly ophthalmology/optometry visit for glaucoma screening and checkup Recommended yearly dental visit for hygiene and checkup  Vaccinations: Influenza vaccine: n/d Pneumococcal vaccine: n/d Tdap vaccine: 01/02/18 Shingles vaccine: n/d   Covid-19:n/d  Advanced directives: no  Conditions/risks identified: none  Next appointment: Follow up in one year for your annual wellness visit 11/22/22 @ 3:30 pm by phone   Preventive Care 65 Years and Older, Female Preventive care refers to lifestyle choices and visits with your health care provider that can promote health and wellness. What does preventive care include? A yearly physical exam. This is also called an annual well check. Dental exams once or twice a year. Routine eye exams. Ask your health care provider how often you should have your eyes checked. Personal lifestyle choices, including: Daily care of your teeth and gums. Regular physical activity. Eating a healthy diet. Avoiding tobacco and drug use. Limiting alcohol use. Practicing safe sex. Taking low-dose aspirin every day. Taking vitamin and mineral supplements as recommended by your health care provider. What happens during an annual well check? The services and screenings done by your health care provider during your annual well check will depend on your age, overall health, lifestyle risk factors, and family history of disease. Counseling  Your health care provider may ask you questions about your: Alcohol use. Tobacco use. Drug use. Emotional well-being. Home and relationship well-being. Sexual  activity. Eating habits. History of falls. Memory and ability to understand (cognition). Work and work Statistician. Reproductive health. Screening  You may have the following tests or measurements: Height, weight, and BMI. Blood pressure. Lipid and cholesterol levels. These may be checked every 5 years, or more frequently if you are over 22 years old. Skin check. Lung cancer screening. You may have this screening every year starting at age 77 if you have a 30-pack-year history of smoking and currently smoke or have quit within the past 15 years. Fecal occult blood test (FOBT) of the stool. You may have this test every year starting at age 28. Flexible sigmoidoscopy or colonoscopy. You may have a sigmoidoscopy every 5 years or a colonoscopy every 10 years starting at age 33. Hepatitis C blood test. Hepatitis B blood test. Sexually transmitted disease (STD) testing. Diabetes screening. This is done by checking your blood sugar (glucose) after you have not eaten for a while (fasting). You may have this done every 1-3 years. Bone density scan. This is done to screen for osteoporosis. You may have this done starting at age 17. Mammogram. This may be done every 1-2 years. Talk to your health care provider about how often you should have regular mammograms. Talk with your health care provider about your test results, treatment options, and if necessary, the need for more tests. Vaccines  Your health care provider may recommend certain vaccines, such as: Influenza vaccine. This is recommended every year. Tetanus, diphtheria, and acellular pertussis (Tdap, Td) vaccine. You may need a Td booster every 10 years. Zoster vaccine. You may need this after age 19. Pneumococcal 13-valent conjugate (PCV13) vaccine. One dose is recommended after age 27. Pneumococcal polysaccharide (PPSV23) vaccine. One dose is recommended after age 11. Talk to your health  care provider about which screenings and vaccines  you need and how often you need them. This information is not intended to replace advice given to you by your health care provider. Make sure you discuss any questions you have with your health care provider. Document Released: 01/30/2015 Document Revised: 09/23/2015 Document Reviewed: 11/04/2014 Elsevier Interactive Patient Education  2017 [employer] Prevention in the Home Falls can cause injuries. They can happen to people of all ages. There are many things you can do to make your home safe and to help prevent falls. What can I do on the outside of my home? Regularly fix the edges of walkways and driveways and fix any cracks. Remove anything that might make you trip as you walk through a door, such as a raised step or threshold. Trim any bushes or trees on the path to your home. Use bright outdoor lighting. Clear any walking paths of anything that might make someone trip, such as rocks or tools. Regularly check to see if handrails are loose or broken. Make sure that both sides of any steps have handrails. Any raised decks and porches should have guardrails on the edges. Have any leaves, snow, or ice cleared regularly. Use sand or salt on walking paths during winter. Clean up any spills in your garage right away. This includes oil or grease spills. What can I do in the bathroom? Use night lights. Install grab bars by the toilet and in the tub and shower. Do not use towel bars as grab bars. Use non-skid mats or decals in the tub or shower. If you need to sit down in the shower, use a plastic, non-slip stool. Keep the floor dry. Clean up any water that spills on the floor as soon as it happens. Remove soap buildup in the tub or shower regularly. Attach bath mats securely with double-sided non-slip rug tape. Do not have throw rugs and other things on the floor that can make you trip. What can I do in the bedroom? Use night lights. Make sure that you have a light by your bed that  is easy to reach. Do not use any sheets or blankets that are too big for your bed. They should not hang down onto the floor. Have a firm chair that has side arms. You can use this for support while you get dressed. Do not have throw rugs and other things on the floor that can make you trip. What can I do in the kitchen? Clean up any spills right away. Avoid walking on wet floors. Keep items that you use a lot in easy-to-reach places. If you need to reach something above you, use a strong step stool that has a grab bar. Keep electrical cords out of the way. Do not use floor polish or wax that makes floors slippery. If you must use wax, use non-skid floor wax. Do not have throw rugs and other things on the floor that can make you trip. What can I do with my stairs? Do not leave any items on the stairs. Make sure that there are handrails on both sides of the stairs and use them. Fix handrails that are broken or loose. Make sure that handrails are as long as the stairways. Check any carpeting to make sure that it is firmly attached to the stairs. Fix any carpet that is loose or worn. Avoid having throw rugs at the top or bottom of the stairs. If you do have throw rugs, attach them to  the floor with carpet tape. Make sure that you have a light switch at the top of the stairs and the bottom of the stairs. If you do not have them, ask someone to add them for you. What else can I do to help prevent falls? Wear shoes that: Do not have high heels. Have rubber bottoms. Are comfortable and fit you well. Are closed at the toe. Do not wear sandals. If you use a stepladder: Make sure that it is fully opened. Do not climb a closed stepladder. Make sure that both sides of the stepladder are locked into place. Ask someone to hold it for you, if possible. Clearly mark and make sure that you can see: Any grab bars or handrails. First and last steps. Where the edge of each step is. Use tools that help you  move around (mobility aids) if they are needed. These include: Canes. Walkers. Scooters. Crutches. Turn on the lights when you go into a dark area. Replace any light bulbs as soon as they burn out. Set up your furniture so you have a clear path. Avoid moving your furniture around. If any of your floors are uneven, fix them. If there are any pets around you, be aware of where they are. Review your medicines with your doctor. Some medicines can make you feel dizzy. This can increase your chance of falling. Ask your doctor what other things that you can do to help prevent falls. This information is not intended to replace advice given to you by your health care provider. Make sure you discuss any questions you have with your health care provider. Document Released: 10/30/2008 Document Revised: 06/11/2015 Document Reviewed: 02/07/2014 Elsevier Interactive Patient Education  2017 [employer].

## 2021-11-11 NOTE — Progress Notes (Signed)
Virtual Visit via Telephone Note  I connected with  Kristin [name] on 11/11/21 at  2:00 PM EDT by telephone and verified that I am speaking with the correct person using two identifiers.  Location: Patient: home Provider: [facility] Persons participating in the virtual visit: patient/Nurse Health Advisor   I discussed the limitations, risks, security and privacy concerns of performing an evaluation and management service by telephone and the availability of in person appointments. The patient expressed understanding and agreed to proceed.  Interactive audio and video telecommunications were attempted between this nurse and patient, however failed, due to patient having technical difficulties OR patient did not have access to video capability.  We continued and completed visit with audio only.  Some vital signs may be absent or patient reported.   Kristin David, LPN  Subjective:   Kristin [name] is a 66 y.o. female who presents for Medicare Annual (Subsequent) preventive examination.  Review of Systems     Cardiac Risk Factors include: advanced age (>48mn, >>53women);dyslipidemia;hypertension     Objective:    Today's Vitals   11/11/21 1359  PainSc: 6    There is no height or weight on file to calculate BMI.     11/11/2021    2:05 PM 03/04/2021    6:38 AM 03/24/2017    9:28 AM 01/05/2017    9:36 AM 02/23/2016    6:42 AM 01/08/2016   10:38 AM 12/04/2014    9:35 AM  Advanced Directives  Does Patient Have a Medical Advance Directive? No No No No No No No  Would patient like information on creating a medical advance directive? No - Patient declined No - Patient declined No - Patient declined No - Patient declined No - Patient declined No - Patient declined No - patient declined information    Current Medications (verified) Outpatient Encounter Medications as of 11/11/2021  Medication Sig   amLODipine (NORVASC) 10 MG tablet Take 1 tablet (10 mg total) by mouth daily.    cholecalciferol (VITAMIN D3) 25 MCG (1000 UNIT) tablet Take 1,000 Units by mouth daily.   citalopram (CELEXA) 20 MG tablet Take 1 tablet (20 mg total) by mouth daily.   ibuprofen (ADVIL) 200 MG tablet Take 600 mg by mouth every 6 (six) hours as needed for headache.   Ketotifen Fumarate (ALLERGY EYE DROPS OP) Place 1 drop into both eyes daily as needed (allergies).   pravastatin (PRAVACHOL) 40 MG tablet TAKE 1 TABLET(40 MG) BY MOUTH DAILY   vitamin B-12 (CYANOCOBALAMIN) 1000 MCG tablet Take 1,000 mcg by mouth 2 (two) times daily.   [DISCONTINUED] HYDROcodone bit-homatropine (HYCODAN) 5-1.5 MG/5ML syrup Take 5 mLs by mouth every 6 (six) hours as needed for cough.   No facility-administered encounter medications on file as of 11/11/2021.    Allergies (verified) Keflex [cephalexin]   History: Past Medical History:  Diagnosis Date   [place]   Anemia    Cavernoma 41/6109  Complication of anesthesia    "need childsized tube to go down my throat/dr in 2008" (05/14/2012)   Hyperlipidemia    [place]parathyroidism ([place] 01/2012   Hypertension 05/14/2012   hx of being on hypertensive meds off meds since 2017    Lyme disease 2010   Peripheral vascular disease ([facility]    varcisoe veins in left leg    Stroke ([facility] 2013   " [place] - had since birth "    Past Surgical History:  Procedure Laterality Date   COLON  RESECTION  2008   COLON SURGERY     COLONOSCOPY  10/12/2010   Procedure: COLONOSCOPY;  Surgeon: Jamesetta So;  [facility];  Service: Gastroenterology;  Laterality: N/A;   COLONOSCOPY N/A 02/23/2016   Procedure: COLONOSCOPY;  Surgeon: Aviva Signs, MD;  Location: [facility];  Service: Gastroenterology;  Laterality: N/A;   COLONOSCOPY WITH PROPOFOL N/A 03/04/2021   Procedure: COLONOSCOPY WITH PROPOFOL;  Surgeon: Aviva Signs, MD;  Location: [facility];  Service: Gastroenterology;  Laterality: N/A;  OK per Melanie   FACIAL COSMETIC SURGERY      eyelids   PARATHYROIDECTOMY Right 03/30/2017   Procedure: RIGHT INFERIOR PARATHYROIDECTOMY, BIOPSY OF RIGHT SUPERIOR PARATHYROIDECTIMY;  Surgeon: Armandina Gemma, MD;  Location: [facility];  Service: General;  Laterality: Right;   WISDOM TOOTH EXTRACTION  2000's   "2" (05/14/2012)   Family History  Problem Relation Age of Onset   Cancer Maternal Grandfather    Hypertension Mother    Heart attack Father    Social History   Socioeconomic History   Marital status: Married    Spouse name: Not on file   Number of children: Not on file   Years of education: Not on file   Highest education level: Not on file  Occupational History   Not on file  Tobacco Use   Smoking status: Never   Smokeless tobacco: Never  Vaping Use   Vaping Use: Never used  Substance and Sexual Activity   Alcohol use: No   Drug use: No   Sexual activity: Yes  Other Topics Concern   Not on file  Social History Narrative   Not on file   Social Determinants of Health   Financial Resource Strain: Low Risk  (11/11/2021)   Overall Financial Resource Strain (CARDIA)    Difficulty of Paying Living Expenses: Not hard at all  Food Insecurity: No Food Insecurity (11/11/2021)   Hunger Vital Sign    Worried About Running Out of Food in the Last Year: Never true    [place] in the Last Year: Never true  Transportation Needs: No Transportation Needs (11/11/2021)   PRAPARE - Hydrologist (Medical): No    Lack of Transportation (Non-Medical): No  Physical Activity: Insufficiently Active (11/11/2021)   Exercise Vital Sign    Days of Exercise per Week: 2 days    Minutes of Exercise per Session: 20 min  Stress: No Stress Concern Present (11/11/2021)   [place]    Feeling of Stress : Not at all  Social Connections: Moderately Integrated (11/11/2021)   Social Connection and Isolation Panel [NHANES]    Frequency of  Communication with Friends and Family: More than three times a week    Frequency of Social Gatherings with Friends and Family: More than three times a week    Attends Religious Services: More than 4 times per year    Active Member of [employer] or Organizations: No    Attends Music therapist: Never    Marital Status: Married    Tobacco Counseling Counseling given: Not Answered   Clinical Intake:  Pre-visit preparation completed: Yes  Pain : 0-10 Pain Score: 6  Pain Type: Chronic pain Pain Location: Knee Pain Orientation: Left Pain Descriptors / Indicators: Aching, Burning, Sore     Diabetes: No  How often do you need to have someone help you when you read instructions, pamphlets, or other written materials  from your doctor or pharmacy?: 1 - Never  Diabetic?no  Interpreter Needed?: No  Information entered by :: Kirke Shaggy, LPN   Activities of Daily Living    11/11/2021    2:07 PM 11/11/2021    1:38 PM  In your present state of health, do you have any difficulty performing the following activities:  Hearing? 0 0  Vision? 0 0  Difficulty concentrating or making decisions? 0 1  Walking or climbing stairs? 1 1  Dressing or bathing? 0 0  Doing errands, shopping? 0 0  Preparing Food and eating ? N N  Using the Toilet? N N  In the past six months, have you accidently leaked urine? Y Y  Do you have problems with loss of bowel control? N N  Managing your Medications? N N  Managing your Finances? N N  Housekeeping or managing your Housekeeping? N N    Patient Care Team: Coral Spikes, DO as PCP - General (Family Medicine)  Indicate any recent Medical Services you may have received from other than [facility] providers in the past year (date may be approximate).     Assessment:   This is a routine wellness examination for Kristin [name].  Hearing/Vision screen Hearing Screening - Comments:: No aids Vision Screening - Comments:: Wears readers- Dr.Groat  Dietary  issues and exercise activities discussed: Current Exercise Habits: Home exercise routine, Type of exercise: walking, Time (Minutes): 20, Frequency (Times/Week): 2, Weekly Exercise (Minutes/Week): 40, Intensity: Mild   Goals Addressed             This Visit's Progress    DIET - EAT MORE FRUITS AND VEGETABLES         Depression Screen    11/11/2021    2:03 PM 07/27/2020    9:50 AM 01/28/2020   11:21 AM 08/13/2019    8:58 AM 01/02/2018   10:04 AM  PHQ 2/9 Scores  PHQ - 2 Score 0 0 0 0 2  PHQ- 9 Score 0 0 0 0 5    Fall Risk    11/11/2021    2:06 PM 11/11/2021    1:38 PM 02/18/2021   11:04 AM 07/27/2020    8:49 AM 01/28/2020   10:36 AM  Fall Risk   Falls in the past year? 0 0 0 1 1  Number falls in past yr: 0 0  0 0  Injury with Fall? 0 0  1 1  Comment     hurt right leg  Risk for fall due to : No Fall Risks   Impaired balance/gait Other (Comment)  Risk for fall due to: Comment     slipped on wet steps  Follow up Falls prevention discussed;Falls evaluation completed  Falls evaluation completed Education provided;Falls evaluation completed Falls evaluation completed    FALL RISK PREVENTION PERTAINING TO THE HOME:  Any stairs in or around the home? Yes  If so, are there any without handrails? No  Home free of loose throw rugs in walkways, pet beds, electrical cords, etc? Yes  Adequate lighting in your home to reduce risk of falls? Yes   ASSISTIVE DEVICES UTILIZED TO PREVENT FALLS:  Life alert? No  Use of a cane, walker or w/c? No  Grab bars in the bathroom? No  Shower chair or bench in shower? No  Elevated toilet seat or a handicapped toilet? No   Cognitive Function:        11/11/2021    2:07 PM  6CIT Screen  What Year? 0  points  What month? 0 points  What time? 0 points  Count back from 20 0 points  Months in reverse 0 points  Repeat phrase 0 points  Total Score 0 points    Immunizations Immunization History  Administered Date(s) Administered    Influenza,inj,Quad PF,6+ Mos 01/02/2018   Tdap 01/02/2018    TDAP status: Up to date  Flu Vaccine status: Due, Education has been provided regarding the importance of this vaccine. Advised may receive this vaccine at local pharmacy or Health Dept. Aware to provide a copy of the vaccination record if obtained from local pharmacy or Health Dept. Verbalized acceptance and understanding.  Pneumococcal vaccine status: Declined,  Education has been provided regarding the importance of this vaccine but patient still declined. Advised may receive this vaccine at local pharmacy or Health Dept. Aware to provide a copy of the vaccination record if obtained from local pharmacy or Health Dept. Verbalized acceptance and understanding.   Covid-19 vaccine status: Declined, Education has been provided regarding the importance of this vaccine but patient still declined. Advised may receive this vaccine at local pharmacy or Health Dept.or vaccine clinic. Aware to provide a copy of the vaccination record if obtained from local pharmacy or Health Dept. Verbalized acceptance and understanding.  Qualifies for Shingles Vaccine? Yes   Zostavax completed No   Shingrix Completed?: No.    Education has been provided regarding the importance of this vaccine. Patient has been advised to call insurance company to determine out of pocket expense if they have not yet received this vaccine. Advised may also receive vaccine at local pharmacy or Health Dept. Verbalized acceptance and understanding.  Screening Tests Health Maintenance  Topic Date Due   Zoster Vaccines- Shingrix (1 of 2) Never done   Pneumonia Vaccine 55+ Years old (1 - PCV) Never done   MAMMOGRAM  10/14/2022   Medicare Annual Wellness (AWV)  12/12/2022   COLONOSCOPY (Pts 45-45yr Insurance coverage will need to be confirmed)  03/04/2026   TETANUS/TDAP  01/03/2028   DEXA SCAN  Completed   Hepatitis C Screening  Completed   HPV VACCINES  Aged Out   INFLUENZA  VACCINE  Discontinued   COVID-19 Vaccine  Discontinued    Health Maintenance  Health Maintenance Due  Topic Date Due   Zoster Vaccines- Shingrix (1 of 2) Never done   Pneumonia Vaccine 67 Years old (1 - PCV) Never done    Colorectal cancer screening: Type of screening: Colonoscopy. Completed 03/04/21. Repeat every 5 years  Mammogram status: Completed 10/13/21. Repeat every year  Bone Density status: Completed 09/18/19. Results reflect: Bone density results: OSTEOPENIA. Repeat every 5 years.  Lung Cancer Screening: (Low Dose CT Chest recommended if Age 67-80years, 30 pack-year currently smoking OR have quit w/in 15years.) does not qualify.   Additional Screening:  Hepatitis C Screening: does qualify; Completed 03/16/15  Vision Screening: Recommended annual ophthalmology exams for early detection of glaucoma and other disorders of the eye. Is the patient up to date with their annual eye exam?  Yes  Who is the provider or what is the name of the office in which the patient attends annual eye exams? Dr.Groat If pt is not established with a provider, would they like to be referred to a provider to establish care? No .   Dental Screening: Recommended annual dental exams for proper oral hygiene  Community Resource Referral / Chronic Care Management: CRR required this visit?  No   CCM required this visit?  No  Plan:     I have personally reviewed and noted the following in the patient's chart:   Medical and social history Use of alcohol, tobacco or illicit drugs  Current medications and supplements including opioid prescriptions. Patient is not currently taking opioid prescriptions. Functional ability and status Nutritional status Physical activity Advanced directives List of other physicians Hospitalizations, surgeries, and ER visits in previous 12 months Vitals Screenings to include cognitive, depression, and falls Referrals and appointments  In addition, I have  reviewed and discussed with patient certain preventive protocols, quality metrics, and best practice recommendations. A written personalized care plan for preventive services as well as general preventive health recommendations were provided to patient.     Kristin David, LPN   [id number]   Nurse Notes: none

## 2022-01-21 ENCOUNTER — Other Ambulatory Visit: Payer: Self-pay | Admitting: Family Medicine

## 2022-02-11 ENCOUNTER — Other Ambulatory Visit: Payer: Self-pay | Admitting: Family Medicine

## 2022-02-24 NOTE — Telephone Encounter (Signed)
Patient has appointment on 2/28 for medication follow up

## 2022-03-16 ENCOUNTER — Ambulatory Visit (INDEPENDENT_AMBULATORY_CARE_PROVIDER_SITE_OTHER): Payer: Medicare Other | Admitting: Family Medicine

## 2022-03-16 ENCOUNTER — Ambulatory Visit (HOSPITAL_COMMUNITY)
Admission: RE | Admit: 2022-03-16 | Discharge: 2022-03-16 | Disposition: A | Discharge status: Home / Self Care | Payer: Medicare Other | Source: Ambulatory Visit | Attending: Family Medicine | Admitting: Family Medicine

## 2022-03-16 DIAGNOSIS — M8580 Other specified disorders of bone density and structure, unspecified site: Secondary | ICD-10-CM | POA: Diagnosis not present

## 2022-03-16 DIAGNOSIS — I10 Essential (primary) hypertension: Secondary | ICD-10-CM

## 2022-03-16 DIAGNOSIS — G459 Transient cerebral ischemic attack, unspecified: Secondary | ICD-10-CM | POA: Diagnosis present

## 2022-03-16 DIAGNOSIS — E785 Hyperlipidemia, unspecified: Secondary | ICD-10-CM

## 2022-03-16 DIAGNOSIS — R7303 Prediabetes: Secondary | ICD-10-CM | POA: Diagnosis not present

## 2022-03-16 NOTE — Assessment & Plan Note (Signed)
Labs today.  MRI imaging for further evaluation.  Also obtaining carotid Dopplers.

## 2022-03-16 NOTE — Progress Notes (Signed)
Subjective:  Patient ID: Kristin [name], female    DOB: October 18, 1955  Age: 67 y.o. MRN: [id number]  CC: Chief Complaint  Patient presents with   Hypertension   recent episode    Dizzy, blurred vision, searching for words 02/20/22 hx of stroke 2014 MRI -    HPI:  67 year old female with hypertension, history of cerebral cavernoma, history of hyperparathyroidism, osteopenia, hyperlipidemia, anxiety, history of colon cancer and history of TIA presents for follow-up.  Patient reports that she is out of her blood pressure medication.  Blood pressure currently fairly well-controlled.  Patient reports that earlier this month on February 4, she had an event where she had dizziness, difficulty speaking, blurry vision, and generalized weakness.  Patient states that she did not seek medical attention.  She states that it took several days before she was back to her baseline.  Patient has had no recent labs.  She did not get her labs as recommended at her prior visit.  Patient Active Problem List   Diagnosis Date Noted   TIA (transient ischemic attack) 03/16/2022   Cerebral cavernoma 02/09/2021   Osteopenia 04/06/2016   Anxiety 03/09/2015   Hyperparathyroidism ([place]) 06/21/2012   Hyperlipidemia 06/21/2012   HTN (hypertension) 05/14/2012   History of colon cancer 09/02/2010    Social Hx   Social History   Socioeconomic History   Marital status: Married    Spouse name: Not on file   Number of children: Not on file   Years of education: Not on file   Highest education level: Not on file  Occupational History   Not on file  Tobacco Use   Smoking status: Never   Smokeless tobacco: Never  Vaping Use   Vaping Use: Never used  Substance and Sexual Activity   Alcohol use: No   Drug use: No   Sexual activity: Yes  Other Topics Concern   Not on file  Social History Narrative   Not on file   Social Determinants of Health   Financial Resource Strain: Low Risk  (11/11/2021)   Overall  Financial Resource Strain (CARDIA)    Difficulty of Paying Living Expenses: Not hard at all  Food Insecurity: No Food Insecurity (11/11/2021)   Hunger Vital Sign    Worried About Running Out of Food in the Last Year: Never true    Ran Out of Food in the Last Year: Never true  Transportation Needs: No Transportation Needs (11/11/2021)   PRAPARE - Hydrologist (Medical): No    Lack of Transportation (Non-Medical): No  Physical Activity: Insufficiently Active (11/11/2021)   Exercise Vital Sign    Days of Exercise per Week: 2 days    Minutes of Exercise per Session: 20 min  Stress: No Stress Concern Present (11/11/2021)   [place]    Feeling of Stress : Not at all  Social Connections: Moderately Integrated (11/11/2021)   Social Connection and Isolation Panel [NHANES]    Frequency of Communication with Friends and Family: More than three times a week    Frequency of Social Gatherings with Friends and Family: More than three times a week    Attends Religious Services: More than 4 times per year    Active Member of [employer] or Organizations: No    Attends Archivist Meetings: Never    Marital Status: Married    Review of Systems Per HPI  Objective:  BP 128/88   Pulse  77   Temp 97.9 F (36.6 C)   Ht 5' 4.75" (1.645 m)   Wt 179 lb (81.2 kg)   SpO2 99%   BMI 30.02 kg/m      03/16/2022   10:01 AM 11/11/2021    2:18 PM 05/13/2021    2:01 PM  BP/Weight  Systolic BP 0000000  [id number]  Diastolic BP 88  81  Wt. (Lbs) 179 174   BMI 30.02 kg/m2 29.41 kg/m2     Physical Exam Vitals and nursing note reviewed.  Constitutional:      General: She is not in acute distress.    Appearance: Normal appearance.  HENT:     Head: Normocephalic and atraumatic.     Mouth/Throat:     Pharynx: Oropharynx is clear.  Eyes:     General:        Right eye: No discharge.        Left eye: No discharge.      Conjunctiva/sclera: Conjunctivae normal.     Pupils: Pupils are equal, round, and reactive to light.  Cardiovascular:     Rate and Rhythm: Normal rate and regular rhythm.     Heart sounds: No murmur heard. Pulmonary:     Effort: Pulmonary effort is normal.     Breath sounds: No wheezing, rhonchi or rales.  Neurological:     General: No focal deficit present.     Mental Status: She is alert.  Psychiatric:        Mood and Affect: Mood normal.        Behavior: Behavior normal.     Lab Results  Component Value Date   WBC 6.3 03/24/2017   HGB 13.9 03/24/2017   HCT 42.9 03/24/2017   PLT 252 03/24/2017   GLUCOSE 81 08/13/2020   CHOL 244 (H) 08/13/2020   TRIG 96 08/13/2020   HDL 61 08/13/2020   LDLCALC 166 (H) 08/13/2020   ALT 19 08/13/2020   AST 23 08/13/2020   NA 138 08/13/2020   K 5.3 (H) 08/13/2020   CL 103 08/13/2020   CREATININE 0.77 08/13/2020   BUN 14 08/13/2020   CO2 22 08/13/2020   TSH 3.270 01/23/2020   HGBA1C 6.0 (H) 08/13/2020     Assessment & Plan:   Problem List Items Addressed This Visit       Cardiovascular and Mediastinum   TIA (transient ischemic attack)    Labs today.  MRI imaging for further evaluation.  Also obtaining carotid Dopplers.      Relevant Orders   MR Brain Wo Contrast   US Carotid Duplex Bilateral   CBC   CMP14+EGFR   HTN (hypertension)    BP stable at this time.  Will continue to monitor.  No pharmacotherapy at this time.  Awaiting labs.        Musculoskeletal and Integument   Osteopenia     Other   Hyperlipidemia    Unsure of control.  Awaiting lipid panel.  Continue pravastatin.      Relevant Orders   Lipid panel   Other Visit Diagnoses     Prediabetes       Relevant Orders   Hemoglobin A1c      Follow-up:  Return in about 2 weeks (around 03/30/2022).  [place]

## 2022-03-16 NOTE — Assessment & Plan Note (Signed)
Unsure of control.  Awaiting lipid panel.  Continue pravastatin.

## 2022-03-16 NOTE — Patient Instructions (Signed)
Labs today.  We will call regarding the MRI and [place].  I will call with results.  Follow up in 2 weeks.

## 2022-03-16 NOTE — Assessment & Plan Note (Signed)
BP stable at this time.  Will continue to monitor.  No pharmacotherapy at this time.  Awaiting labs.

## 2022-03-17 ENCOUNTER — Encounter: Payer: Self-pay | Admitting: Radiology

## 2022-03-17 LAB — LIPID PANEL
Chol/HDL Ratio: 3.2 ratio (ref 0.0–4.4)
Cholesterol, Total: 192 mg/dL (ref 100–199)
HDL: 60 mg/dL (ref >39)
LDL Chol Calc (NIH): 109 mg/dL — ABNORMAL HIGH (ref 0–99)
Triglycerides: 129 mg/dL (ref 0–149)
VLDL Cholesterol Cal: 23 mg/dL (ref 5–40)

## 2022-03-17 LAB — CMP14+EGFR
ALT: 15 IU/L (ref 0–32)
AST: 20 IU/L (ref 0–40)
Albumin/Globulin Ratio: 2 (ref 1.2–2.2)
Albumin: 4.5 g/dL (ref 3.9–4.9)
Alkaline Phosphatase: 86 IU/L (ref 44–121)
BUN/Creatinine Ratio: 17 (ref 12–28)
BUN: 14 mg/dL (ref 8–27)
Bilirubin Total: 0.5 mg/dL (ref 0.0–1.2)
CO2: 24 mmol/L (ref 20–29)
Calcium: 10.4 mg/dL — ABNORMAL HIGH (ref 8.7–10.3)
Chloride: 104 mmol/L (ref 96–106)
Creatinine, Ser: 0.84 mg/dL (ref 0.57–1.00)
Globulin, Total: 2.3 g/dL (ref 1.5–4.5)
Glucose: 86 mg/dL (ref 70–99)
Potassium: 5.1 mmol/L (ref 3.5–5.2)
Sodium: 141 mmol/L (ref 134–144)
Total Protein: 6.8 g/dL (ref 6.0–8.5)
eGFR: 76 mL/min/{1.73_m2} (ref >59)

## 2022-03-17 LAB — CBC
Hematocrit: 41.5 % (ref 34.0–46.6)
Hemoglobin: 13.5 g/dL (ref 11.1–15.9)
MCH: 29.3 pg (ref 26.6–33.0)
MCHC: 32.5 g/dL (ref 31.5–35.7)
MCV: 90 fL (ref 79–97)
Platelets: 234 10*3/uL (ref 150–450)
RBC: 4.61 x10E6/uL (ref 3.77–5.28)
RDW: 13.7 % (ref 11.7–15.4)
WBC: 7.1 10*3/uL (ref 3.4–10.8)

## 2022-03-17 LAB — HEMOGLOBIN A1C
Est. average glucose Bld gHb Est-mCnc: 117 mg/dL
Hgb A1c MFr Bld: 5.7 % — ABNORMAL HIGH (ref 4.8–5.6)

## 2022-03-18 ENCOUNTER — Other Ambulatory Visit: Payer: Self-pay | Admitting: Family Medicine

## 2022-03-18 MED ORDER — ROSUVASTATIN CALCIUM 20 MG PO TABS: 20.0000 mg | ORAL_TABLET | Freq: Every day | ORAL | 3 refills | Status: DC

## 2022-03-18 NOTE — Addendum Note (Signed)
Addended by: Dairl Ponder on: 03/18/2022 10:49 AM   Modules accepted: Orders

## 2022-03-30 ENCOUNTER — Ambulatory Visit (INDEPENDENT_AMBULATORY_CARE_PROVIDER_SITE_OTHER): Payer: Medicare Other | Admitting: Family Medicine

## 2022-03-30 ENCOUNTER — Encounter: Payer: Self-pay | Admitting: Family Medicine

## 2022-03-30 VITALS — BP 130/98 | HR 61 | Temp 97.3°F | Ht 64.75 in | Wt 178.0 lb

## 2022-03-30 DIAGNOSIS — G459 Transient cerebral ischemic attack, unspecified: Secondary | ICD-10-CM

## 2022-03-30 DIAGNOSIS — I10 Essential (primary) hypertension: Secondary | ICD-10-CM | POA: Diagnosis not present

## 2022-03-30 NOTE — Progress Notes (Signed)
Subjective:  Patient ID: Kristin [name], female    DOB: 11/06/1955  Age: 67 y.o. MRN: [id number]  CC: Chief Complaint  Patient presents with   Follow-up    TIA    HPI:  67 year old female presents for follow-up.  MRI imaging essentially unrevealing.  Carotid Dopplers were negative.  Patient is back to her baseline.  Her history is quite suspicious for TIA.  She states that she has had similar episodes in the past but this was worse.  She has an upcoming appointment with neurology.  She is on high intensity statin.  Patient does have a cerebral cavernoma.  Therefore, I am holding off on aspirin therapy until neurology evaluation.  Patient Active Problem List   Diagnosis Date Noted   TIA (transient ischemic attack) 03/16/2022   Cerebral cavernoma 02/09/2021   Osteopenia 04/06/2016   Anxiety 03/09/2015   Hyperlipidemia 06/21/2012   HTN (hypertension) 05/14/2012   History of colon cancer 09/02/2010    Social Hx   Social History   Socioeconomic History   Marital status: Married    Spouse name: Not on file   Number of children: Not on file   Years of education: Not on file   Highest education level: Not on file  Occupational History   Not on file  Tobacco Use   Smoking status: Never   Smokeless tobacco: Never  Vaping Use   Vaping Use: Never used  Substance and Sexual Activity   Alcohol use: No   Drug use: No   Sexual activity: Yes  Other Topics Concern   Not on file  Social History Narrative   Not on file   Social Determinants of Health   Financial Resource Strain: Low Risk  (11/11/2021)   Overall Financial Resource Strain (CARDIA)    Difficulty of Paying Living Expenses: Not hard at all  Food Insecurity: No Food Insecurity (11/11/2021)   Hunger Vital Sign    Worried About Running Out of Food in the Last Year: Never true    Ran Out of Food in the Last Year: Never true  Transportation Needs: No Transportation Needs (11/11/2021)   PRAPARE - Armed forces logistics/support/administrative officer (Medical): No    Lack of Transportation (Non-Medical): No  Physical Activity: Insufficiently Active (11/11/2021)   Exercise Vital Sign    Days of Exercise per Week: 2 days    Minutes of Exercise per Session: 20 min  Stress: No Stress Concern Present (11/11/2021)   [place]    Feeling of Stress : Not at all  Social Connections: Moderately Integrated (11/11/2021)   Social Connection and Isolation Panel [NHANES]    Frequency of Communication with Friends and Family: More than three times a week    Frequency of Social Gatherings with Friends and Family: More than three times a week    Attends Religious Services: More than 4 times per year    Active Member of [employer] or Organizations: No    Attends Music therapist: Never    Marital Status: Married    Review of Systems Per HPI  Objective:  BP (!) 130/98   Pulse 61   Temp (!) 97.3 F (36.3 C)   Ht 5' 4.75" (1.645 m)   Wt 178 lb (80.7 kg)   SpO2 96%   BMI 29.85 kg/m      03/30/2022    8:40 AM 03/30/2022    8:25 AM 03/30/2022  8:20 AM  BP/Weight  Systolic BP [id number] 0000000  Diastolic BP 98 [id number]  Wt. (Lbs)   178  BMI   29.85 kg/m2    Physical Exam Vitals and nursing note reviewed.  Constitutional:      General: She is not in acute distress.    Appearance: Normal appearance.  Eyes:     General:        Right eye: No discharge.        Left eye: No discharge.     Conjunctiva/sclera: Conjunctivae normal.  Cardiovascular:     Rate and Rhythm: Normal rate and regular rhythm.  Pulmonary:     Effort: Pulmonary effort is normal.     Breath sounds: Normal breath sounds. No wheezing, rhonchi or rales.  Neurological:     General: No focal deficit present.     Mental Status: She is alert.  Psychiatric:        Mood and Affect: Mood normal.        Behavior: Behavior normal.     Lab Results  Component Value Date   WBC 7.1  03/16/2022   HGB 13.5 03/16/2022   HCT 41.5 03/16/2022   PLT 234 03/16/2022   GLUCOSE 86 03/16/2022   CHOL 192 03/16/2022   TRIG 129 03/16/2022   HDL 60 03/16/2022   LDLCALC 109 (H) 03/16/2022   ALT 15 03/16/2022   AST 20 03/16/2022   NA 141 03/16/2022   K 5.1 03/16/2022   CL 104 03/16/2022   CREATININE 0.84 03/16/2022   BUN 14 03/16/2022   CO2 24 03/16/2022   TSH 3.270 01/23/2020   HGBA1C 5.7 (H) 03/16/2022     Assessment & Plan:   Problem List Items Addressed This Visit       Cardiovascular and Mediastinum   HTN (hypertension)    BP elevated here today.  At last visit, patient's blood pressure was well-controlled.  Home readings are stable.  Will have patient continue to monitor at home.  If blood pressure remains greater than 130/80 persistently we will proceed with pharmacotherapy.      TIA (transient ischemic attack) - Primary    Patient with recent suspected TIA.  This could be a mimic.  Imaging without acute findings.  Has known cerebral cavernoma and has mild chronic small vessel ischemia.  Continuing high intensity statin.  Holding off on aspirin.  Patient has an upcoming appointment with neurology.       Follow-up:  3-6 months  [place] DO [place]

## 2022-03-30 NOTE — Patient Instructions (Signed)
Continue your medications.  Check BP regularly at home. If persistently elevated >130/80, please let me know so we can start medication.  Follow up in 3-6 months.

## 2022-03-30 NOTE — Assessment & Plan Note (Signed)
Patient with recent suspected TIA.  This could be a mimic.  Imaging without acute findings.  Has known cerebral cavernoma and has mild chronic small vessel ischemia.  Continuing high intensity statin.  Holding off on aspirin.  Patient has an upcoming appointment with neurology.

## 2022-03-30 NOTE — Assessment & Plan Note (Signed)
BP elevated here today.  At last visit, patient's blood pressure was well-controlled.  Home readings are stable.  Will have patient continue to monitor at home.  If blood pressure remains greater than 130/80 persistently we will proceed with pharmacotherapy.

## 2022-04-29 ENCOUNTER — Ambulatory Visit: Payer: Medicare Other | Admitting: Neurology

## 2022-04-29 ENCOUNTER — Encounter: Payer: Self-pay | Admitting: Neurology

## 2022-04-29 ENCOUNTER — Ambulatory Visit (INDEPENDENT_AMBULATORY_CARE_PROVIDER_SITE_OTHER): Payer: Medicare Other | Admitting: Neurology

## 2022-04-29 VITALS — BP 179/104 | HR 75 | Ht 64.0 in | Wt 177.0 lb

## 2022-04-29 DIAGNOSIS — D1800 Hemangioma unspecified site: Secondary | ICD-10-CM

## 2022-04-29 DIAGNOSIS — R404 Transient alteration of awareness: Secondary | ICD-10-CM

## 2022-04-29 NOTE — Progress Notes (Signed)
[facility]  PATIENT: Kristin [name] DOB: [birth year]  REQUESTING CLINICIAN: Tommie Sams, DO HISTORY FROM: Patient and daughter  REASON FOR VISIT: Episode of dizziness, slurred speech    HISTORICAL  CHIEF COMPLAINT:  Chief Complaint  Patient presents with   Transient Ischemic Attack    Rm 13 with daughter Kristin [name]  Pt is well and stable, reports no residual deficits since recent TIA. She has had some speech impairment since first stroke 10 years ago, did not worsen.     HISTORY OF PRESENT ILLNESS:  This is a 67 year old woman with past medical history of hypertension, hyperlipidemia, left frontal cavernoma, who is presenting after an episode of dizziness, slurred speech, could not keep her balance lasting for about 4 days. Patient reports the episode occurred on a Sunday and the next time she felt like her normal self was Thursday. She did not seek medical attention at that time but presented to her PCP later. Initial diagnosis TIA, she completed MRI Brain which was negative for any acute stroke and [place] which did not show any large vessel occlusion.  No other symptoms, she feels like she is her normal self. She reports that her doctor have recommended her to hold her aspirin and Amlodipine until completion of all work up. She checks her BP at home and reports that it is most of the time normal but she does have some elevated readings  Denies any falls. In term of the cavernoma, she has not seen Neurosurgery or Neurology lately, this was diagnosed 10 years ago.    OTHER MEDICAL CONDITIONS: Left frontal cavernoma, Hypertension, Hyperlipidemia    REVIEW OF SYSTEMS: Full 14 system review of systems performed and negative with exception of: As noted in the HPI   ALLERGIES: Allergies  Allergen Reactions   Keflex [Cephalexin] Hives and Itching    HOME MEDICATIONS: Outpatient Medications Prior to Visit  Medication Sig Dispense Refill   cholecalciferol (VITAMIN  D3) 25 MCG (1000 UNIT) tablet Take 1,000 Units by mouth daily.     citalopram (CELEXA) 20 MG tablet Take 1 tablet (20 mg total) by mouth daily. 90 tablet 0   Ketotifen Fumarate (ALLERGY EYE DROPS OP) Place 1 drop into both eyes daily as needed (allergies).     rosuvastatin (CRESTOR) 20 MG tablet Take 1 tablet (20 mg total) by mouth daily. 90 tablet 3   vitamin B-12 (CYANOCOBALAMIN) 1000 MCG tablet Take 1,000 mcg by mouth 2 (two) times daily.     No facility-administered medications prior to visit.    PAST MEDICAL HISTORY: Past Medical History:  Diagnosis Date   Adenocarcinoma of colon 2008   Anemia    Cavernoma 04/2012   Complication of anesthesia    "need childsized tube to go down my throat/dr in 2008" (05/14/2012)   Hyperlipidemia    Hyperparathyroidism 01/2012   Hypertension 05/14/2012   hx of being on hypertensive meds off meds since 2017    Lyme disease 2010   Peripheral vascular disease    varcisoe veins in left leg    Stroke 2013   " [place] - had since birth "     PAST SURGICAL HISTORY: Past Surgical History:  Procedure Laterality Date   COLON RESECTION  2008   COLON SURGERY     COLONOSCOPY  10/12/2010   Procedure: COLONOSCOPY;  Surgeon: Dalia Heading;  Location: [facility];  Service: Gastroenterology;  Laterality: N/A;   COLONOSCOPY N/A 02/23/2016   Procedure: COLONOSCOPY;  Surgeon: Franky Macho, MD;  Location: [facility];  Service: Gastroenterology;  Laterality: N/A;   COLONOSCOPY WITH PROPOFOL N/A 03/04/2021   Procedure: COLONOSCOPY WITH PROPOFOL;  Surgeon: Franky Macho, MD;  Location: [facility];  Service: Gastroenterology;  Laterality: N/A;  OK per Melanie   FACIAL COSMETIC SURGERY     eyelids   PARATHYROIDECTOMY Right 03/30/2017   Procedure: RIGHT INFERIOR PARATHYROIDECTOMY, BIOPSY OF RIGHT SUPERIOR PARATHYROIDECTIMY;  Surgeon: Darnell Level, MD;  Location: [facility];  Service: General;  Laterality: Right;   WISDOM TOOTH EXTRACTION  2000's   "2" (05/14/2012)     FAMILY HISTORY: Family History  Problem Relation Age of Onset   Cancer Maternal Grandfather    Hypertension Mother    Heart attack Father     SOCIAL HISTORY: Social History   Socioeconomic History   Marital status: Married    Spouse name: Not on file   Number of children: Not on file   Years of education: Not on file   Highest education level: Not on file  Occupational History   Not on file  Tobacco Use   Smoking status: Never   Smokeless tobacco: Never  Vaping Use   Vaping Use: Never used  Substance and Sexual Activity   Alcohol use: No   Drug use: No   Sexual activity: Yes  Other Topics Concern   Not on file  Social History Narrative   Not on file   Social Determinants of Health   Financial Resource Strain: Low Risk  (11/11/2021)   Overall Financial Resource Strain (CARDIA)    Difficulty of Paying Living Expenses: Not hard at all  Food Insecurity: No Food Insecurity (11/11/2021)   Hunger Vital Sign    Worried About Running Out of Food in the Last Year: Never true    Ran Out of Food in the Last Year: Never true  Transportation Needs: No Transportation Needs (11/11/2021)   PRAPARE - Administrator, Civil Service (Medical): No    Lack of Transportation (Non-Medical): No  Physical Activity: Insufficiently Active (11/11/2021)   Exercise Vital Sign    Days of Exercise per Week: 2 days    Minutes of Exercise per Session: 20 min  Stress: No Stress Concern Present (11/11/2021)   [employer] of Occupational Health - Occupational Stress Questionnaire    Feeling of Stress : Not at all  Social Connections: Moderately Integrated (11/11/2021)   Social Connection and Isolation Panel [NHANES]    Frequency of Communication with Friends and Family: More than three times a week    Frequency of Social Gatherings with Friends and Family: More than three times a week    Attends Religious Services: More than 4 times per year    Active Member of [employer] or  Organizations: No    Attends Banker Meetings: Never    Marital Status: Married  Catering manager Violence: Not At Risk (11/11/2021)   Humiliation, Afraid, Rape, and Kick questionnaire    Fear of Current or Ex-Partner: No    Emotionally Abused: No    Physically Abused: No    Sexually Abused: No     PHYSICAL EXAM   GENERAL EXAM/CONSTITUTIONAL: Vitals:  Vitals:   04/29/22 0934 04/29/22 0941  BP: (!) 170/101 (!) 179/104  Pulse: 72 75  Weight: 177 lb (80.3 kg)   Height: 5\' 4"  (1.626 m)    Body mass index is 30.38 kg/m. Wt Readings from Last 3 Encounters:  04/29/22 177 lb (80.3 kg)  03/30/22 178 lb (80.7 kg)  03/16/22 179 lb (81.2 kg)   Patient is in no distress; well developed, nourished and groomed; neck is supple  EYES: Visual fields full to confrontation, Extraocular movements intacts,   MUSCULOSKELETAL: Gait, strength, tone, movements noted in Neurologic exam below  NEUROLOGIC: MENTAL STATUS:      No data to display         awake, alert, oriented to person, place and time recent and remote memory intact normal attention and concentration language fluent, comprehension intact, naming intact fund of knowledge appropriate  CRANIAL NERVE:  2nd, 3rd, 4th, 6th - Visual fields full to confrontation, extraocular muscles intact, no nystagmus 5th - facial sensation symmetric 7th - facial strength symmetric 8th - hearing intact 9th - palate elevates symmetrically, uvula midline 11th - shoulder shrug symmetric 12th - tongue protrusion midline  MOTOR:  normal bulk and tone, full strength in the BUE, BLE  SENSORY:  normal and symmetric to light touch  COORDINATION:  finger-nose-finger, fine finger movements normal  GAIT/STATION:  normal     DIAGNOSTIC DATA (LABS, IMAGING, TESTING) - I reviewed patient records, labs, notes, testing and imaging myself where available.  Lab Results  Component Value Date   WBC 7.1 03/16/2022   HGB 13.5  03/16/2022   HCT 41.5 03/16/2022   MCV 90 03/16/2022   PLT 234 03/16/2022      Component Value Date/Time   NA 141 03/16/2022 1048   K 5.1 03/16/2022 1048   CL 104 03/16/2022 1048   CO2 24 03/16/2022 1048   GLUCOSE 86 03/16/2022 1048   GLUCOSE 70 03/24/2017 0938   BUN 14 03/16/2022 1048   CREATININE 0.84 03/16/2022 1048   CALCIUM 10.4 (H) 03/16/2022 1048   PROT 6.8 03/16/2022 1048   ALBUMIN 4.5 03/16/2022 1048   AST 20 03/16/2022 1048   ALT 15 03/16/2022 1048   ALKPHOS 86 03/16/2022 1048   BILITOT 0.5 03/16/2022 1048   GFRNONAA 66 01/23/2020 0927   GFRAA 77 01/23/2020 0927   Lab Results  Component Value Date   CHOL 192 03/16/2022   HDL 60 03/16/2022   LDLCALC 109 (H) 03/16/2022   TRIG 129 03/16/2022   CHOLHDL 3.2 03/16/2022   Lab Results  Component Value Date   HGBA1C 5.7 (H) 03/16/2022   Lab Results  Component Value Date   VITAMINB12 651 05/14/2012   Lab Results  Component Value Date   TSH 3.270 01/23/2020    MRI Brain 03/16/2022 1. No acute finding. 2. Mild chronic small vessel ischemia that is progressed from 2014. 3. Uncomplicated Cavernoma in the left frontal white matter measuring 9 mm.   US Carotids 26/28/2024 1. Right carotid artery system: Patent without significant atherosclerotic plaque formation. 2. Left carotid artery system: Patent without significant atherosclerotic plaque formation. 3.  Vertebral artery system: Patent with antegrade flow bilaterally.  ASSESSMENT AND PLAN  67 y.o. year old female with history of left frontal cavernoma, hypertension, hyperlipidemia, who is presenting after an episode of dizziness, slurred speech, generalized weakness, lasting up to 4 days. Her MRI was negative for any acute stroke, will obtain a routine EEG, she does have risk factor for seizure (left frontal cavernoma).  I will also obtain a CTA head for evaluation of the cavernoma. I have asked patient to hold aspirin until we complete the CTA and strongly  advised her to contact her PCP regarding restarting BP meds. Both patient and daughter are agreeable with plans. Continue to follow up with PCP  and return in a year or sooner if worse.    1. Transient alteration of awareness   2. Cavernoma      Patient Instructions  Routine EEG  CTA for evaluation of the cavernoma  Hold Aspirin until completion of the CTA  Follow up with PCP regarding blood pressure management  Return in a year or sooner if worse   Orders Placed This Encounter  Procedures   CT ANGIO HEAD NECK W WO CM   EEG adult    No orders of the defined types were placed in this encounter.   Return in about 1 year (around 04/29/2023).    Windell Norfolk, MD 04/29/2022, 3:35 PM  [facility] [address] [place], [place] [address] [phone]

## 2022-04-29 NOTE — Patient Instructions (Signed)
Routine EEG  CTA for evaluation of the cavernoma  Hold Aspirin until completion of the CTA  Follow up with PCP regarding blood pressure management  Return in a year or sooner if worse

## 2022-05-02 ENCOUNTER — Telehealth: Payer: Self-pay | Admitting: Neurology

## 2022-05-02 NOTE — Telephone Encounter (Signed)
medicare npr sent to [facility] [phone]

## 2022-05-12 ENCOUNTER — Ambulatory Visit (INDEPENDENT_AMBULATORY_CARE_PROVIDER_SITE_OTHER): Payer: Medicare Other | Admitting: Neurology

## 2022-05-12 DIAGNOSIS — R404 Transient alteration of awareness: Secondary | ICD-10-CM

## 2022-05-12 DIAGNOSIS — R4182 Altered mental status, unspecified: Secondary | ICD-10-CM

## 2022-05-12 NOTE — Procedures (Signed)
    History:  67 year old woman with TIA   EEG classification: Awake and drowsy  Description of the recording: The background rhythms of this recording consists of a fairly well modulated medium amplitude alpha rhythm of 9 Hz that is reactive to eye opening and closure. Present in the anterior head region is a 15-20 Hz beta activity. Photic stimulation was performed, did not show any abnormalities. Hyperventilation was also performed, did not show any abnormalities. Drowsiness was manifested by background fragmentation. No abnormal epileptiform discharges seen during this recording. There was no focal slowing. There were no electrographic seizure identified.   Abnormality: None   Impression: This is a normal EEG recorded while drowsy and awake. No evidence of interictal epileptiform discharges. Normal EEGs, however, do not rule out epilepsy.    Windell Norfolk, MD [facility]

## 2022-05-20 ENCOUNTER — Ambulatory Visit (HOSPITAL_COMMUNITY)
Admission: RE | Admit: 2022-05-20 | Discharge: 2022-05-20 | Disposition: A | Discharge status: Home / Self Care | Payer: Medicare Other | Source: Ambulatory Visit | Attending: Neurology | Admitting: Neurology

## 2022-05-20 DIAGNOSIS — D1800 Hemangioma unspecified site: Secondary | ICD-10-CM | POA: Diagnosis present

## 2022-05-20 MED ORDER — IOHEXOL 350 MG/ML SOLN
75.0000 mL | Freq: Once | INTRAVENOUS | Status: AC | PRN
  Administered 2022-05-20: 75 mL via INTRAVENOUS

## 2022-05-23 NOTE — Progress Notes (Signed)
Please call and advise the patient/daughter that her left frontal cavernoma was stable. No evidence of bleeding. Please advise her to restart her daily aspirin.  No further action is required on this test at this time. Please remind patient to keep any upcoming appointments or tests and to call us with any interim questions, concerns, problems or updates. Thanks,   Windell Norfolk, MD

## 2022-06-23 ENCOUNTER — Other Ambulatory Visit: Payer: Self-pay

## 2022-08-07 ENCOUNTER — Other Ambulatory Visit: Payer: Self-pay | Admitting: Family Medicine

## 2022-08-07 DIAGNOSIS — F419 Anxiety disorder, unspecified: Secondary | ICD-10-CM

## 2022-08-30 ENCOUNTER — Ambulatory Visit (INDEPENDENT_AMBULATORY_CARE_PROVIDER_SITE_OTHER): Payer: Medicare Other | Admitting: Family Medicine

## 2022-08-30 DIAGNOSIS — E785 Hyperlipidemia, unspecified: Secondary | ICD-10-CM | POA: Diagnosis not present

## 2022-08-30 DIAGNOSIS — R404 Transient alteration of awareness: Secondary | ICD-10-CM | POA: Diagnosis not present

## 2022-08-30 DIAGNOSIS — I10 Essential (primary) hypertension: Secondary | ICD-10-CM

## 2022-08-30 DIAGNOSIS — Z23 Encounter for immunization: Secondary | ICD-10-CM

## 2022-08-30 DIAGNOSIS — F419 Anxiety disorder, unspecified: Secondary | ICD-10-CM

## 2022-08-30 MED ORDER — CITALOPRAM HYDROBROMIDE 20 MG PO TABS: 20.0000 mg | ORAL_TABLET | Freq: Every day | ORAL | 3 refills | Status: DC

## 2022-08-30 NOTE — Patient Instructions (Signed)
Lipid panel today.  I will reach out to neurology.  Continue your medication.  Follow up in 6 months.

## 2022-08-30 NOTE — Assessment & Plan Note (Signed)
Stable on Celexa.  Continue. 

## 2022-08-30 NOTE — Assessment & Plan Note (Signed)
Has been seen by neurology.  Has had no additional episodes.  Negative EEG.  Imaging with stable cavernoma.  Will continue to follow closely.

## 2022-08-30 NOTE — Progress Notes (Signed)
Subjective:  Patient ID: Kristin [name], female    DOB: [birth year]  Age: 67 y.o. MRN: [id number]  CC:  Follow up  HPI:  67 year old female with the below mentioned medical problems presents for follow-up.  Patient's blood pressure readings are high at home.  She uses a wrist cuff.  I suspect that this is the culprit for her high readings.  Blood pressure fairly well-controlled here today.  No current pharmacotherapy.  Has been seen by neurology.  Had a negative workup.  Cerebral cavernoma stable.  Patient needs lipid panel to reassess control of lipids.  She is on Crestor and tolerating.  Anxiety stable on Celexa.  Patient due for immunizations.  Flu vaccine not yet available.  Discussed getting shingles vaccine at the pharmacy.  She is amenable to pneumococcal vaccine today.  Patient Active Problem List   Diagnosis Date Noted   Transient alteration of awareness 08/30/2022   Cerebral cavernoma 02/09/2021   Osteopenia 04/06/2016   Anxiety 03/09/2015   Hyperlipidemia 06/21/2012   HTN (hypertension) 05/14/2012   History of colon cancer 09/02/2010    Social Hx   Social History   Socioeconomic History   Marital status: Married    Spouse name: Not on file   Number of children: Not on file   Years of education: Not on file   Highest education level: Not on file  Occupational History   Not on file  Tobacco Use   Smoking status: Never   Smokeless tobacco: Never  Vaping Use   Vaping status: Never Used  Substance and Sexual Activity   Alcohol use: No   Drug use: No   Sexual activity: Yes  Other Topics Concern   Not on file  Social History Narrative   Not on file   Social Determinants of Health   Financial Resource Strain: Low Risk  (11/11/2021)   Overall Financial Resource Strain (CARDIA)    Difficulty of Paying Living Expenses: Not hard at all  Food Insecurity: No Food Insecurity (11/11/2021)   Hunger Vital Sign    Worried About Running Out of Food in the Last  Year: Never true    Ran Out of Food in the Last Year: Never true  Transportation Needs: No Transportation Needs (11/11/2021)   PRAPARE - Administrator, Civil Service (Medical): No    Lack of Transportation (Non-Medical): No  Physical Activity: Insufficiently Active (11/11/2021)   Exercise Vital Sign    Days of Exercise per Week: 2 days    Minutes of Exercise per Session: 20 min  Stress: No Stress Concern Present (11/11/2021)   [employer] of Occupational Health - Occupational Stress Questionnaire    Feeling of Stress : Not at all  Social Connections: Moderately Integrated (11/11/2021)   Social Connection and Isolation Panel [NHANES]    Frequency of Communication with Friends and Family: More than three times a week    Frequency of Social Gatherings with Friends and Family: More than three times a week    Attends Religious Services: More than 4 times per year    Active Member of [employer] or Organizations: No    Attends Banker Meetings: Never    Marital Status: Married    Review of Systems  Constitutional: Negative.   Respiratory: Negative.    Cardiovascular: Negative.      Objective:  BP 126/85   Pulse 74   Temp (!) 97.3 F (36.3 C)   Ht 5\' 4"  (1.626 m)   Wt  181 lb 6.4 oz (82.3 kg)   SpO2 97%   BMI 31.14 kg/m      08/30/2022    9:57 AM 04/29/2022    9:41 AM 04/29/2022    9:34 AM  BP/Weight  Systolic BP 126 179 170  Diastolic BP 85 104 101  Wt. (Lbs) 181.4  177  BMI 31.14 kg/m2  30.38 kg/m2    Physical Exam Vitals and nursing note reviewed.  Constitutional:      General: She is not in acute distress.    Appearance: Normal appearance.  HENT:     Head: Normocephalic and atraumatic.  Eyes:     General:        Right eye: No discharge.        Left eye: No discharge.     Conjunctiva/sclera: Conjunctivae normal.  Cardiovascular:     Rate and Rhythm: Normal rate and regular rhythm.  Pulmonary:     Effort: Pulmonary effort is normal.      Breath sounds: Normal breath sounds. No wheezing, rhonchi or rales.  Neurological:     Mental Status: She is alert.  Psychiatric:        Mood and Affect: Mood normal.        Behavior: Behavior normal.     Lab Results  Component Value Date   WBC 7.1 03/16/2022   HGB 13.5 03/16/2022   HCT 41.5 03/16/2022   PLT 234 03/16/2022   GLUCOSE 86 03/16/2022   CHOL 192 03/16/2022   TRIG 129 03/16/2022   HDL 60 03/16/2022   LDLCALC 109 (H) 03/16/2022   ALT 15 03/16/2022   AST 20 03/16/2022   NA 141 03/16/2022   K 5.1 03/16/2022   CL 104 03/16/2022   CREATININE 0.84 03/16/2022   BUN 14 03/16/2022   CO2 24 03/16/2022   TSH 3.270 01/23/2020   HGBA1C 5.7 (H) 03/16/2022     Assessment & Plan:   Problem List Items Addressed This Visit       Cardiovascular and Mediastinum   HTN (hypertension)    Stable.  Will continue monitor.        Other   Transient alteration of awareness    Has been seen by neurology.  Has had no additional episodes.  Negative EEG.  Imaging with stable cavernoma.  Will continue to follow closely.      Hyperlipidemia    Lipid panel today to assess. Continue Crestor.       Relevant Orders   Lipid panel   Anxiety    Stable on Celexa.  Continue.      Relevant Medications   citalopram (CELEXA) 20 MG tablet   Other Visit Diagnoses     Immunization due       Relevant Orders   Pneumococcal conjugate vaccine 20-valent (Prevnar 20) (Completed)       Meds ordered this encounter  Medications   citalopram (CELEXA) 20 MG tablet    Sig: Take 1 tablet (20 mg total) by mouth daily.    Dispense:  90 tablet    Refill:  3    Follow-up:  No follow-ups on file.  Everlene Other DO [facility]

## 2022-08-30 NOTE — Assessment & Plan Note (Signed)
Stable.  Will continue monitor.

## 2022-08-30 NOTE — Assessment & Plan Note (Signed)
Lipid panel today to assess.  Continue Crestor. 

## 2022-12-02 ENCOUNTER — Ambulatory Visit: Payer: Medicare Other

## 2022-12-02 VITALS — Ht 64.0 in | Wt 181.0 lb

## 2022-12-02 DIAGNOSIS — Z780 Asymptomatic menopausal state: Secondary | ICD-10-CM

## 2022-12-02 DIAGNOSIS — Z0000 Encounter for general adult medical examination without abnormal findings: Secondary | ICD-10-CM

## 2022-12-02 DIAGNOSIS — Z1231 Encounter for screening mammogram for malignant neoplasm of breast: Secondary | ICD-10-CM

## 2022-12-02 NOTE — Patient Instructions (Signed)
Kristin [name] , Thank you for taking time to come for your Medicare Wellness Visit. I appreciate your ongoing commitment to your health goals. Please review the following plan we discussed and let me know if I can assist you in the future.   Referrals/Orders/Follow-Ups/Clinician Recommendations: Aim for 30 minutes of exercise or brisk walking, 6-8 glasses of water, and 5 servings of fruits and vegetables each day.  You have an order for:  []   2D Mammogram  [x]   3D Mammogram  [x]   Bone Density     Please call for appointment:  [facility] at [facility] [address] [place], [place] [address] [phone]  Make sure to wear two-piece clothing.  No lotions, powders, or deodorants the day of the appointment. Make sure to bring picture ID and insurance card.  Bring list of medications you are currently taking including any supplements.   Schedule your [place] screening mammogram through MyChart!   Log into your MyChart account.  Go to 'Visit' (or 'Appointments' if on mobile App) --> Schedule an Appointment  Under 'Select a Reason for Visit' choose the Mammogram Screening option.  Complete the pre-visit questions and select the time and place that best fits your schedule.    This is a list of the screening recommended for you and due dates:  Health Maintenance  Topic Date Due   Zoster (Shingles) Vaccine (1 of 2) Never done   Mammogram  10/14/2022   Flu Shot  04/17/2023*   Medicare Annual Wellness Visit  12/02/2023   Colon Cancer Screening  03/04/2026   DTaP/Tdap/Td vaccine (2 - Td or Tdap) 01/03/2028   Pneumonia Vaccine  Completed   DEXA scan (bone density measurement)  Completed   Hepatitis C Screening  Completed   HPV Vaccine  Aged Out   COVID-19 Vaccine  Discontinued  *Topic was postponed. The date shown is not the original due date.    Advanced directives: (ACP Link)Information on Advanced Care Planning can be found at [facility] Advance Health Care Directives Advance Health Care Directives ([email or web address]   Next Medicare Annual Wellness Visit scheduled for next year: Yes

## 2022-12-02 NOTE — Progress Notes (Signed)
Subjective:   Kristin [name] is a 67 y.o. female who presents for Medicare Annual (Subsequent) preventive examination.  Visit Complete: Virtual I connected with  Kristin [name] on 12/02/22 by a audio enabled telemedicine application and verified that I am speaking with the correct person using two identifiers.  Patient Location: Home  Provider Location: Home Office  I discussed the limitations of evaluation and management by telemedicine. The patient expressed understanding and agreed to proceed.  Vital Signs: Because this visit was a virtual/telehealth visit, some criteria may be missing or patient reported. Any vitals not documented were not able to be obtained and vitals that have been documented are patient reported.  Patient Medicare AWV questionnaire was completed by the patient on 12/01/22; I have confirmed that all information answered by patient is correct and no changes since this date.  Cardiac Risk Factors include: advanced age (>72men, >26 women);dyslipidemia;hypertension     Objective:    Today's Vitals   12/02/22 1556  Weight: 181 lb (82.1 kg)  Height: 5\' 4"  (1.626 m)   Body mass index is 31.07 kg/m.     12/02/2022    4:00 PM 11/11/2021    2:05 PM 03/04/2021    6:38 AM 03/24/2017    9:28 AM 01/05/2017    9:36 AM 02/23/2016    6:42 AM 01/08/2016   10:38 AM  Advanced Directives  Does Patient Have a Medical Advance Directive? No No No No No No No  Would patient like information on creating a medical advance directive? Yes (MAU/Ambulatory/Procedural Areas - Information given) No - Patient declined No - Patient declined No - Patient declined No - Patient declined No - Patient declined No - Patient declined    Current Medications (verified) Outpatient Encounter Medications as of 12/02/2022  Medication Sig   cholecalciferol (VITAMIN D3) 25 MCG (1000 UNIT) tablet Take 1,000 Units by mouth daily.   citalopram (CELEXA) 20 MG tablet Take 1 tablet (20 mg total) by  mouth daily.   Ketotifen Fumarate (ALLERGY EYE DROPS OP) Place 1 drop into both eyes daily as needed (allergies).   rosuvastatin (CRESTOR) 20 MG tablet Take 1 tablet (20 mg total) by mouth daily.   vitamin B-12 (CYANOCOBALAMIN) 1000 MCG tablet Take 1,000 mcg by mouth 2 (two) times daily.   No facility-administered encounter medications on file as of 12/02/2022.    Allergies (verified) Keflex [cephalexin]   History: Past Medical History:  Diagnosis Date   Adenocarcinoma of colon (HCC) 2008   Anemia    Cavernoma 04/2012   Complication of anesthesia    "need childsized tube to go down my throat/dr in 2008" (05/14/2012)   Hyperlipidemia    Hyperparathyroidism (HCC) 01/2012   Hypertension 05/14/2012   hx of being on hypertensive meds off meds since 2017    Lyme disease 2010   Peripheral vascular disease (HCC)    varcisoe veins in left leg    Stroke (HCC) 2013   " [place] - had since birth "    Past Surgical History:  Procedure Laterality Date   COLON RESECTION  2008   COLON SURGERY     COLONOSCOPY  10/12/2010   Procedure: COLONOSCOPY;  Surgeon: Dalia Heading;  Location: [facility];  Service: Gastroenterology;  Laterality: N/A;   COLONOSCOPY N/A 02/23/2016   Procedure: COLONOSCOPY;  Surgeon: Franky Macho, MD;  Location: [facility];  Service: Gastroenterology;  Laterality: N/A;   COLONOSCOPY WITH PROPOFOL N/A 03/04/2021   Procedure: COLONOSCOPY WITH PROPOFOL;  Surgeon: Franky Macho, MD;  Location: [facility];  Service: Gastroenterology;  Laterality: N/A;  OK per Melanie   FACIAL COSMETIC SURGERY     eyelids   PARATHYROIDECTOMY Right 03/30/2017   Procedure: RIGHT INFERIOR PARATHYROIDECTOMY, BIOPSY OF RIGHT SUPERIOR PARATHYROIDECTIMY;  Surgeon: Darnell Level, MD;  Location: [facility];  Service: General;  Laterality: Right;   WISDOM TOOTH EXTRACTION  2000's   "2" (05/14/2012)   Family History  Problem Relation Age of Onset   Cancer Maternal Grandfather    Hypertension Mother     Heart attack Father    Social History   Socioeconomic History   Marital status: Married    Spouse name: Not on file   Number of children: Not on file   Years of education: Not on file   Highest education level: Not on file  Occupational History   Not on file  Tobacco Use   Smoking status: Never   Smokeless tobacco: Never  Vaping Use   Vaping status: Never Used  Substance and Sexual Activity   Alcohol use: No   Drug use: No   Sexual activity: Yes  Other Topics Concern   Not on file  Social History Narrative   Not on file   Social Determinants of Health   Financial Resource Strain: Low Risk  (12/01/2022)   Overall Financial Resource Strain (CARDIA)    Difficulty of Paying Living Expenses: Not hard at all  Food Insecurity: No Food Insecurity (12/01/2022)   Hunger Vital Sign    Worried About Running Out of Food in the Last Year: Never true    Ran Out of Food in the Last Year: Never true  Transportation Needs: No Transportation Needs (12/01/2022)   PRAPARE - Administrator, Civil Service (Medical): No    Lack of Transportation (Non-Medical): No  Physical Activity: Insufficiently Active (12/01/2022)   Exercise Vital Sign    Days of Exercise per Week: 4 days    Minutes of Exercise per Session: 20 min  Stress: No Stress Concern Present (12/01/2022)   [employer] of Occupational Health - Occupational Stress Questionnaire    Feeling of Stress : Not at all  Social Connections: Socially Integrated (12/01/2022)   Social Connection and Isolation Panel [NHANES]    Frequency of Communication with Friends and Family: More than three times a week    Frequency of Social Gatherings with Friends and Family: More than three times a week    Attends Religious Services: More than 4 times per year    Active Member of [employer] or Organizations: Yes    Attends Engineer, structural: More than 4 times per year    Marital Status: Married    Tobacco  Counseling Counseling given: Not Answered   Clinical Intake:  Pre-visit preparation completed: Yes  Pain : No/denies pain     Diabetes: No  How often do you need to have someone help you when you read instructions, pamphlets, or other written materials from your doctor or pharmacy?: 1 - Never  Interpreter Needed?: No  Information entered by :: Kandis Fantasia LPN   Activities of Daily Living    12/01/2022    2:36 PM  In your present state of health, do you have any difficulty performing the following activities:  Hearing? 0  Vision? 0  Difficulty concentrating or making decisions? 1  Walking or climbing stairs? 0  Dressing or bathing? 0  Doing errands, shopping? 0  Preparing Food and eating ? N  Using the Toilet? N  In the past six months, have you accidently leaked urine? Y  Do you have problems with loss of bowel control? Y  Managing your Medications? N  Managing your Finances? N  Housekeeping or managing your Housekeeping? N    Patient Care Team: Tommie Sams, DO as PCP - General (Family Medicine)  Indicate any recent Medical Services you may have received from other than [facility] providers in the past year (date may be approximate).     Assessment:   This is a routine wellness examination for Kristin [name].  Hearing/Vision screen Hearing Screening - Comments:: Denies hearing difficulties   Vision Screening - Comments:: No vision problems; will schedule routine eye exam soon     Goals Addressed   None   Depression Screen    12/02/2022    3:58 PM 03/30/2022    8:26 AM 03/16/2022   10:15 AM 11/11/2021    2:03 PM 07/27/2020    9:50 AM 01/28/2020   11:21 AM 08/13/2019    8:58 AM  PHQ 2/9 Scores  PHQ - 2 Score 0 0 0 0 0 0 0  PHQ- 9 Score  0 2 0 0 0 0    Fall Risk    12/01/2022    2:36 PM 03/16/2022   10:15 AM 11/11/2021    2:06 PM 11/11/2021    1:38 PM 02/18/2021   11:04 AM  Fall Risk   Falls in the past year? 0 0 0 0 0  Number falls in past yr: 0 0 0 0    Injury with Fall? 0 0 0 0   Risk for fall due to :  No Fall Risks No Fall Risks    Follow up   Falls prevention discussed;Falls evaluation completed  Falls evaluation completed    MEDICARE RISK AT HOME: Medicare Risk at Home Any stairs in or around the home?: Yes If so, are there any without handrails?: Yes Home free of loose throw rugs in walkways, pet beds, electrical cords, etc?: Yes Adequate lighting in your home to reduce risk of falls?: Yes Life alert?: No Use of a cane, walker or w/c?: No Grab bars in the bathroom?: No Shower chair or bench in shower?: No Elevated toilet seat or a handicapped toilet?: No  TIMED UP AND GO:  Was the test performed?  No    Cognitive Function:        12/02/2022    4:00 PM 11/11/2021    2:07 PM  6CIT Screen  What Year? 0 points 0 points  What month? 0 points 0 points  What time? 0 points 0 points  Count back from 20 0 points 0 points  Months in reverse 0 points 0 points  Repeat phrase 0 points 0 points  Total Score 0 points 0 points    Immunizations Immunization History  Administered Date(s) Administered   Influenza,inj,Quad PF,6+ Mos 01/02/2018   PNEUMOCOCCAL CONJUGATE-20 08/30/2022   Tdap 01/02/2018    TDAP status: Up to date  Flu Vaccine status: Declined, Education has been provided regarding the importance of this vaccine but patient still declined. Advised may receive this vaccine at local pharmacy or Health Dept. Aware to provide a copy of the vaccination record if obtained from local pharmacy or Health Dept. Verbalized acceptance and understanding.  Pneumococcal vaccine status: Up to date  Covid-19 vaccine status: Information provided on how to obtain vaccines.   Qualifies for Shingles Vaccine? Yes   Zostavax completed No   Shingrix  Completed?: No.    Education has been provided regarding the importance of this vaccine. Patient has been advised to call insurance company to determine out of pocket expense if they have  not yet received this vaccine. Advised may also receive vaccine at local pharmacy or Health Dept. Verbalized acceptance and understanding.  Screening Tests Health Maintenance  Topic Date Due   Zoster Vaccines- Shingrix (1 of 2) Never done   MAMMOGRAM  10/14/2022   INFLUENZA VACCINE  04/17/2023 (Originally 08/18/2022)   Medicare Annual Wellness (AWV)  12/02/2023   Colonoscopy  03/04/2026   DTaP/Tdap/Td (2 - Td or Tdap) 01/03/2028   Pneumonia Vaccine 41+ Years old  Completed   DEXA SCAN  Completed   Hepatitis C Screening  Completed   HPV VACCINES  Aged Out   COVID-19 Vaccine  Discontinued    Health Maintenance  Health Maintenance Due  Topic Date Due   Zoster Vaccines- Shingrix (1 of 2) Never done   MAMMOGRAM  10/14/2022    Colorectal cancer screening: Type of screening: Colonoscopy. Completed 03/04/21. Repeat every 5 years  Mammogram status: Ordered today. Pt provided with contact info and advised to call to schedule appt.   Bone Density status: Ordered today. Pt provided with contact info and advised to call to schedule appt.  Lung Cancer Screening: (Low Dose CT Chest recommended if Age 29-80 years, 20 pack-year currently smoking OR have quit w/in 15years.) does not qualify.   Lung Cancer Screening Referral: n/a  Additional Screening:  Hepatitis C Screening: does qualify; Completed 03/16/15  Vision Screening: Recommended annual ophthalmology exams for early detection of glaucoma and other disorders of the eye. Is the patient up to date with their annual eye exam?  No  Who is the provider or what is the name of the office in which the patient attends annual eye exams? none If pt is not established with a provider, would they like to be referred to a provider to establish care? No .   Dental Screening: Recommended annual dental exams for proper oral hygiene  Community Resource Referral / Chronic Care Management: CRR required this visit?  No   CCM required this visit?   No     Plan:     I have personally reviewed and noted the following in the patient's chart:   Medical and social history Use of alcohol, tobacco or illicit drugs  Current medications and supplements including opioid prescriptions. Patient is not currently taking opioid prescriptions. Functional ability and status Nutritional status Physical activity Advanced directives List of other physicians Hospitalizations, surgeries, and ER visits in previous 12 months Vitals Screenings to include cognitive, depression, and falls Referrals and appointments  In addition, I have reviewed and discussed with patient certain preventive protocols, quality metrics, and best practice recommendations. A written personalized care plan for preventive services as well as general preventive health recommendations were provided to patient.     Kandis Fantasia [place], [place]   21/30/8657   After Visit Summary: (MyChart) Due to this being a telephonic visit, the after visit summary with patients personalized plan was offered to patient via MyChart   Nurse Notes: No concerns at this time

## 2023-02-06 ENCOUNTER — Other Ambulatory Visit: Payer: Self-pay | Admitting: Family Medicine

## 2023-02-06 DIAGNOSIS — E785 Hyperlipidemia, unspecified: Secondary | ICD-10-CM

## 2023-02-07 ENCOUNTER — Other Ambulatory Visit: Payer: Self-pay | Admitting: Family Medicine

## 2023-02-07 DIAGNOSIS — F419 Anxiety disorder, unspecified: Secondary | ICD-10-CM

## 2023-03-02 ENCOUNTER — Ambulatory Visit (INDEPENDENT_AMBULATORY_CARE_PROVIDER_SITE_OTHER): Payer: PPO | Admitting: Family Medicine

## 2023-03-02 DIAGNOSIS — F419 Anxiety disorder, unspecified: Secondary | ICD-10-CM

## 2023-03-02 DIAGNOSIS — I10 Essential (primary) hypertension: Secondary | ICD-10-CM | POA: Diagnosis not present

## 2023-03-02 DIAGNOSIS — Z780 Asymptomatic menopausal state: Secondary | ICD-10-CM

## 2023-03-02 DIAGNOSIS — M8580 Other specified disorders of bone density and structure, unspecified site: Secondary | ICD-10-CM | POA: Diagnosis not present

## 2023-03-02 DIAGNOSIS — E785 Hyperlipidemia, unspecified: Secondary | ICD-10-CM

## 2023-03-02 NOTE — Assessment & Plan Note (Signed)
Stable on Celexa.  Continue.

## 2023-03-02 NOTE — Assessment & Plan Note (Signed)
Stable. Continue Crestor.

## 2023-03-02 NOTE — Patient Instructions (Signed)
Call [phone] to schedule mammogram and bone density.  Continue your medications.  Follow up in 6 months.

## 2023-03-02 NOTE — Progress Notes (Signed)
Subjective:  Patient ID: Kristin [name], female    DOB: March 31, 1955  Age: 68 y.o. MRN: [id number]  CC:   Chief Complaint  Patient presents with   Hyperlipidemia    HPI:  68 year old female presents for follow up.  Hypertension stable.  Patient has osteopenia.  Needs bone density.  Patient is overdue for mammogram.  Patient's lipids have been fairly well-controlled on Crestor.  She is tolerating.  Denies chest pain or shortness of breath.  No complaints or concerns at this time.  Patient Active Problem List   Diagnosis Date Noted   Transient alteration of awareness 08/30/2022   Cerebral cavernoma 02/09/2021   Osteopenia 04/06/2016   Anxiety 03/09/2015   Hyperlipidemia 06/21/2012   HTN (hypertension) 05/14/2012   History of colon cancer 09/02/2010    Social Hx   Social History   Socioeconomic History   Marital status: Married    Spouse name: Not on file   Number of children: Not on file   Years of education: Not on file   Highest education level: Not on file  Occupational History   Not on file  Tobacco Use   Smoking status: Never   Smokeless tobacco: Never  Vaping Use   Vaping status: Never Used  Substance and Sexual Activity   Alcohol use: No   Drug use: No   Sexual activity: Yes  Other Topics Concern   Not on file  Social History Narrative   Not on file   Social Drivers of Health   Financial Resource Strain: Low Risk  (12/01/2022)   Overall Financial Resource Strain (CARDIA)    Difficulty of Paying Living Expenses: Not hard at all  Food Insecurity: No Food Insecurity (12/01/2022)   Hunger Vital Sign    Worried About Running Out of Food in the Last Year: Never true    Ran Out of Food in the Last Year: Never true  Transportation Needs: No Transportation Needs (12/01/2022)   PRAPARE - Administrator, Civil Service (Medical): No    Lack of Transportation (Non-Medical): No  Physical Activity: Insufficiently Active (12/01/2022)   Exercise  Vital Sign    Days of Exercise per Week: 4 days    Minutes of Exercise per Session: 20 min  Stress: No Stress Concern Present (12/01/2022)   [employer] of Occupational Health - Occupational Stress Questionnaire    Feeling of Stress : Not at all  Social Connections: Socially Integrated (12/01/2022)   Social Connection and Isolation Panel [NHANES]    Frequency of Communication with Friends and Family: More than three times a week    Frequency of Social Gatherings with Friends and Family: More than three times a week    Attends Religious Services: More than 4 times per year    Active Member of [employer] or Organizations: Yes    Attends Engineer, structural: More than 4 times per year    Marital Status: Married    Review of Systems Per HPI  Objective:  BP 132/87   Pulse 66   Temp (!) 97.2 F (36.2 C)   Ht 5' 3.78" (1.62 m)   Wt 185 lb (83.9 kg)   SpO2 96%   BMI 31.97 kg/m      03/02/2023    9:57 AM 03/02/2023    9:53 AM 12/02/2022    3:56 PM  BP/Weight  Systolic BP 132 152 --  Diastolic BP 87 92 --  Wt. (Lbs)  185 181  BMI  31.97 kg/m2 31.07 kg/m2    Physical Exam Vitals and nursing note reviewed.  Constitutional:      General: She is not in acute distress.    Appearance: Normal appearance.  HENT:     Head: Normocephalic and atraumatic.  Eyes:     General:        Right eye: No discharge.        Left eye: No discharge.     Conjunctiva/sclera: Conjunctivae normal.  Cardiovascular:     Rate and Rhythm: Normal rate and regular rhythm.  Pulmonary:     Effort: Pulmonary effort is normal.     Breath sounds: Normal breath sounds. No wheezing, rhonchi or rales.  Neurological:     Mental Status: She is alert.  Psychiatric:        Mood and Affect: Mood normal.        Behavior: Behavior normal.     Lab Results  Component Value Date   WBC 7.1 03/16/2022   HGB 13.5 03/16/2022   HCT 41.5 03/16/2022   PLT 234 03/16/2022   GLUCOSE 86 03/16/2022   CHOL  160 08/30/2022   TRIG 120 08/30/2022   HDL 56 08/30/2022   LDLCALC 83 08/30/2022   ALT 15 03/16/2022   AST 20 03/16/2022   NA 141 03/16/2022   K 5.1 03/16/2022   CL 104 03/16/2022   CREATININE 0.84 03/16/2022   BUN 14 03/16/2022   CO2 24 03/16/2022   TSH 3.270 01/23/2020   HGBA1C 5.7 (H) 03/16/2022     Assessment & Plan:   Problem List Items Addressed This Visit       Cardiovascular and Mediastinum   HTN (hypertension)   Stable.        Musculoskeletal and Integument   Osteopenia   Relevant Orders   DG Bone Density     Other   Anxiety   Stable on Celexa.  Continue.      Hyperlipidemia   Stable.  Continue Crestor.      Other Visit Diagnoses       Postmenopausal       Relevant Orders   DG Bone Density       Follow-up: 6 months  Helder Crisafulli Adriana Simas DO [facility]

## 2023-03-02 NOTE — Assessment & Plan Note (Signed)
Stable

## 2023-03-15 ENCOUNTER — Telehealth: Payer: Self-pay

## 2023-03-15 NOTE — Telephone Encounter (Signed)
 Pt came into office today 03/15/2023 asking for lab work

## 2023-03-16 ENCOUNTER — Other Ambulatory Visit: Payer: Self-pay | Admitting: Family Medicine

## 2023-03-17 ENCOUNTER — Other Ambulatory Visit: Payer: Self-pay

## 2023-03-17 DIAGNOSIS — E785 Hyperlipidemia, unspecified: Secondary | ICD-10-CM

## 2023-03-17 DIAGNOSIS — R7303 Prediabetes: Secondary | ICD-10-CM

## 2023-03-17 DIAGNOSIS — I10 Essential (primary) hypertension: Secondary | ICD-10-CM

## 2023-04-21 DIAGNOSIS — E785 Hyperlipidemia, unspecified: Secondary | ICD-10-CM | POA: Diagnosis not present

## 2023-04-21 DIAGNOSIS — R7303 Prediabetes: Secondary | ICD-10-CM | POA: Diagnosis not present

## 2023-04-21 DIAGNOSIS — I10 Essential (primary) hypertension: Secondary | ICD-10-CM | POA: Diagnosis not present

## 2023-04-22 LAB — CMP14+EGFR
ALT: 18 IU/L (ref 0–32)
AST: 24 IU/L (ref 0–40)
Albumin: 4.1 g/dL (ref 3.9–4.9)
Alkaline Phosphatase: 62 IU/L (ref 44–121)
BUN/Creatinine Ratio: 17 (ref 12–28)
BUN: 13 mg/dL (ref 8–27)
Bilirubin Total: 0.5 mg/dL (ref 0.0–1.2)
CO2: 22 mmol/L (ref 20–29)
Calcium: 9.7 mg/dL (ref 8.7–10.3)
Chloride: 104 mmol/L (ref 96–106)
Creatinine, Ser: 0.77 mg/dL (ref 0.57–1.00)
Globulin, Total: 2.1 g/dL (ref 1.5–4.5)
Glucose: 74 mg/dL (ref 70–99)
Potassium: 5.3 mmol/L — ABNORMAL HIGH (ref 3.5–5.2)
Sodium: 139 mmol/L (ref 134–144)
Total Protein: 6.2 g/dL (ref 6.0–8.5)
eGFR: 84 mL/min/{1.73_m2} (ref >59)

## 2023-04-22 LAB — CBC WITH DIFFERENTIAL/PLATELET
Basophils Absolute: 0.1 10*3/uL (ref 0.0–0.2)
Basos: 1 %
EOS (ABSOLUTE): 0.1 10*3/uL (ref 0.0–0.4)
Eos: 2 %
Hematocrit: 42.6 % (ref 34.0–46.6)
Hemoglobin: 13.8 g/dL (ref 11.1–15.9)
Immature Grans (Abs): 0 10*3/uL (ref 0.0–0.1)
Immature Granulocytes: 0 %
Lymphocytes Absolute: 1.5 10*3/uL (ref 0.7–3.1)
Lymphs: 29 %
MCH: 29.1 pg (ref 26.6–33.0)
MCHC: 32.4 g/dL (ref 31.5–35.7)
MCV: 90 fL (ref 79–97)
Monocytes Absolute: 0.4 10*3/uL (ref 0.1–0.9)
Monocytes: 8 %
Neutrophils Absolute: 3 10*3/uL (ref 1.4–7.0)
Neutrophils: 60 %
Platelets: 202 10*3/uL (ref 150–450)
RBC: 4.75 x10E6/uL (ref 3.77–5.28)
RDW: 13.3 % (ref 11.7–15.4)
WBC: 5 10*3/uL (ref 3.4–10.8)

## 2023-04-22 LAB — LIPID PANEL
Chol/HDL Ratio: 2.4 ratio (ref 0.0–4.4)
Cholesterol, Total: 149 mg/dL (ref 100–199)
HDL: 61 mg/dL (ref >39)
LDL Chol Calc (NIH): 74 mg/dL (ref 0–99)
Triglycerides: 69 mg/dL (ref 0–149)
VLDL Cholesterol Cal: 14 mg/dL (ref 5–40)

## 2023-04-22 LAB — HEMOGLOBIN A1C
Est. average glucose Bld gHb Est-mCnc: 120 mg/dL
Hgb A1c MFr Bld: 5.8 % — ABNORMAL HIGH (ref 4.8–5.6)

## 2023-04-23 ENCOUNTER — Encounter: Payer: Self-pay | Admitting: Family Medicine

## 2023-05-01 ENCOUNTER — Ambulatory Visit (HOSPITAL_COMMUNITY)

## 2023-05-01 ENCOUNTER — Encounter: Payer: Self-pay | Admitting: Neurology

## 2023-05-01 ENCOUNTER — Encounter (HOSPITAL_COMMUNITY): Payer: Self-pay

## 2023-05-01 ENCOUNTER — Ambulatory Visit (HOSPITAL_COMMUNITY): Admission: RE | Admit: 2023-05-01 | Source: Ambulatory Visit

## 2023-05-01 ENCOUNTER — Ambulatory Visit: Payer: Medicare Other | Admitting: Neurology

## 2023-05-01 VITALS — BP 169/104 | HR 64 | Ht 64.0 in | Wt 184.0 lb

## 2023-05-01 DIAGNOSIS — Q283 Other malformations of cerebral vessels: Secondary | ICD-10-CM

## 2023-05-01 NOTE — Progress Notes (Signed)
 [facility]  PATIENT: Kristin [name] DOB: February 04, 1955  REQUESTING CLINICIAN: Cook, Jayce G, DO HISTORY FROM: Patient and daughter  REASON FOR VISIT: Episode of dizziness, slurred speech    HISTORICAL  CHIEF COMPLAINT:  Chief Complaint  Patient presents with   Follow-up    RM12, ALONE, Cavernoma, TRANSIENT ALTERATION OF AWARENESS: pt stated that she has been doing well and denied any TAA episodes.    INTERVAL HISTORY 05/01/2023 Patient presents today for follow-up, last visit was a year ago, since then she has been doing well, has not had any additional episodes of slurred speech.  Her EEG was completed and was normal, CTA did not show any other vascular abnormality.  Currently she does not have any additional complaints or concerns.   HISTORY OF PRESENT ILLNESS:  This is a 68 year old woman with past medical history of hypertension, hyperlipidemia, left frontal cavernoma, who is presenting after an episode of dizziness, slurred speech, could not keep her balance lasting for about 4 days. Patient reports the episode occurred on a Sunday and the next time she felt like her normal self was Thursday. She did not seek medical attention at that time but presented to her PCP later. Initial diagnosis TIA, she completed MRI Brain which was negative for any acute stroke and US  which did not show any large vessel occlusion.  No other symptoms, she feels like she is her normal self. She reports that her doctor have recommended her to hold her aspirin and Amlodipine until completion of all work up. She checks her BP at home and reports that it is most of the time normal but she does have some elevated readings  Denies any falls. In term of the cavernoma, she has not seen Neurosurgery or Neurology lately, this was diagnosed 10 years ago.    OTHER MEDICAL CONDITIONS: Left frontal cavernoma, Hypertension, Hyperlipidemia    REVIEW OF SYSTEMS: Full 14 system review of systems  performed and negative with exception of: As noted in the HPI   ALLERGIES: Allergies  Allergen Reactions   Keflex [Cephalexin] Hives and Itching    HOME MEDICATIONS: Outpatient Medications Prior to Visit  Medication Sig Dispense Refill   cholecalciferol (VITAMIN D3) 25 MCG (1000 UNIT) tablet Take 1,000 Units by mouth daily.     citalopram (CELEXA) 20 MG tablet TAKE 1 TABLET(20 MG) BY MOUTH DAILY 90 tablet 3   Ketotifen Fumarate (ALLERGY EYE DROPS OP) Place 1 drop into both eyes daily as needed (allergies).     rosuvastatin (CRESTOR) 20 MG tablet TAKE 1 TABLET BY MOUTH EVERY DAY 90 tablet 3   vitamin B-12 (CYANOCOBALAMIN) 1000 MCG tablet Take 1,000 mcg by mouth 2 (two) times daily.     No facility-administered medications prior to visit.    PAST MEDICAL HISTORY: Past Medical History:  Diagnosis Date   Adenocarcinoma of colon (HCC) 2008   Anemia    Cavernoma 04/2012   Complication of anesthesia    "need childsized tube to go down my throat/dr in 2008" (05/14/2012)   Hyperlipidemia    Hyperparathyroidism (HCC) 01/2012   Hypertension 05/14/2012   hx of being on hypertensive meds off meds since 2017    Lyme disease 2010   Peripheral vascular disease (HCC)    varcisoe veins in left leg    Stroke [facility]) 2013   " [place] - had since birth "     PAST SURGICAL HISTORY: Past Surgical History:  Procedure Laterality Date   COLON RESECTION  2008   COLON SURGERY     COLONOSCOPY  10/12/2010   Procedure: COLONOSCOPY;  Surgeon: Beau Bound;  [facility]: [facility];  Service: Gastroenterology;  Laterality: N/A;   COLONOSCOPY N/A 02/23/2016   Procedure: COLONOSCOPY;  Surgeon: Alanda Allegra, MD;  Location: [facility];  Service: Gastroenterology;  Laterality: N/A;   COLONOSCOPY WITH PROPOFOL N/A 03/04/2021   Procedure: COLONOSCOPY WITH PROPOFOL;  Surgeon: Alanda Allegra, MD;  Location: [facility];  Service: Gastroenterology;  Laterality: N/A;  OK per Melanie   FACIAL COSMETIC SURGERY      eyelids   PARATHYROIDECTOMY Right 03/30/2017   Procedure: RIGHT INFERIOR PARATHYROIDECTOMY, BIOPSY OF RIGHT SUPERIOR PARATHYROIDECTIMY;  Surgeon: Oralee Billow, MD;  Location: [facility];  Service: General;  Laterality: Right;   WISDOM TOOTH EXTRACTION  2000's   "2" (05/14/2012)    FAMILY HISTORY: Family History  Problem Relation Age of Onset   Cancer Maternal Grandfather    Hypertension Mother    Heart attack Father     SOCIAL HISTORY: Social History   Socioeconomic History   Marital status: Married    Spouse name: Not on file   Number of children: Not on file   Years of education: Not on file   Highest education level: Not on file  Occupational History   Not on file  Tobacco Use   Smoking status: Never   Smokeless tobacco: Never  Vaping Use   Vaping status: Never Used  Substance and Sexual Activity   Alcohol use: No   Drug use: No   Sexual activity: Yes  Other Topics Concern   Not on file  Social History Narrative   Not on file   Social Drivers of Health   Financial Resource Strain: Low Risk  (12/01/2022)   Overall Financial Resource Strain (CARDIA)    Difficulty of Paying Living Expenses: Not hard at all  Food Insecurity: No Food Insecurity (12/01/2022)   Hunger Vital Sign    Worried About Running Out of Food in the Last Year: Never true    Ran Out of Food in the Last Year: Never true  Transportation Needs: No Transportation Needs (12/01/2022)   PRAPARE - Administrator, Civil Service (Medical): No    Lack of Transportation (Non-Medical): No  Physical Activity: Insufficiently Active (12/01/2022)   Exercise Vital Sign    Days of Exercise per Week: 4 days    Minutes of Exercise per Session: 20 min  Stress: No Stress Concern Present (12/01/2022)   [employer] of Occupational Health - Occupational Stress Questionnaire    Feeling of Stress : Not at all  Social Connections: Socially Integrated (12/01/2022)   Social Connection and Isolation Panel  [NHANES]    Frequency of Communication with Friends and Family: More than three times a week    Frequency of Social Gatherings with Friends and Family: More than three times a week    Attends Religious Services: More than 4 times per year    Active Member of [employer] or Organizations: Yes    Attends Banker Meetings: More than 4 times per year    Marital Status: Married  Catering manager Violence: Not At Risk (12/02/2022)   Humiliation, Afraid, Rape, and Kick questionnaire    Fear of Current or Ex-Partner: No    Emotionally Abused: No    Physically Abused: No    Sexually Abused: No     PHYSICAL EXAM   GENERAL EXAM/CONSTITUTIONAL: Vitals:  Vitals:   05/01/23 1106  05/01/23 1110  BP: (!) 163/95 (!) 169/104  Pulse:  64  Weight:  184 lb (83.5 kg)  Height:  5\' 4"  (1.626 m)   Body mass index is 31.58 kg/m. Wt Readings from Last 3 Encounters:  05/01/23 184 lb (83.5 kg)  03/02/23 185 lb (83.9 kg)  12/02/22 181 lb (82.1 kg)   Patient is in no distress; well developed, nourished and groomed; neck is supple  MUSCULOSKELETAL: Gait, strength, tone, movements noted in Neurologic exam below  NEUROLOGIC: MENTAL STATUS:      No data to display         awake, alert, oriented to person, place and time recent and remote memory intact normal attention and concentration language fluent, comprehension intact, naming intact fund of knowledge appropriate  CRANIAL NERVE:  2nd, 3rd, 4th, 6th - Visual fields full to confrontation, extraocular muscles intact, no nystagmus 5th - facial sensation symmetric 7th - facial strength symmetric 8th - hearing intact 9th - palate elevates symmetrically, uvula midline 11th - shoulder shrug symmetric 12th - tongue protrusion midline  MOTOR:  normal bulk and tone, full strength in the BUE, BLE  SENSORY:  normal and symmetric to light touch  COORDINATION:  finger-nose-finger, fine finger movements normal  GAIT/STATION:   normal   DIAGNOSTIC DATA (LABS, IMAGING, TESTING) - I reviewed patient records, labs, notes, testing and imaging myself where available.  Lab Results  Component Value Date   WBC 5.0 04/21/2023   HGB 13.8 04/21/2023   HCT 42.6 04/21/2023   MCV 90 04/21/2023   PLT 202 04/21/2023      Component Value Date/Time   NA 139 04/21/2023 0956   K 5.3 (H) 04/21/2023 0956   CL 104 04/21/2023 0956   CO2 22 04/21/2023 0956   GLUCOSE 74 04/21/2023 0956   GLUCOSE 70 03/24/2017 0938   BUN 13 04/21/2023 0956   CREATININE 0.77 04/21/2023 0956   CALCIUM 9.7 04/21/2023 0956   PROT 6.2 04/21/2023 0956   ALBUMIN 4.1 04/21/2023 0956   AST 24 04/21/2023 0956   ALT 18 04/21/2023 0956   ALKPHOS 62 04/21/2023 0956   BILITOT 0.5 04/21/2023 0956   GFRNONAA 66 01/23/2020 0927   GFRAA 77 01/23/2020 0927   Lab Results  Component Value Date   CHOL 149 04/21/2023   HDL 61 04/21/2023   LDLCALC 74 04/21/2023   TRIG 69 04/21/2023   CHOLHDL 2.4 04/21/2023   Lab Results  Component Value Date   HGBA1C 5.8 (H) 04/21/2023   Lab Results  Component Value Date   VITAMINB12 651 05/14/2012   Lab Results  Component Value Date   TSH 3.270 01/23/2020   MRI Brain 03/16/2022 1. No acute finding. 2. Mild chronic small vessel ischemia that is progressed from 2014. 3. Uncomplicated Cavernoma in the left frontal white matter measuring 9 mm.  US Carotids 26/28/2024 1. Right carotid artery system: Patent without significant atherosclerotic plaque formation. 2. Left carotid artery system: Patent without significant atherosclerotic plaque formation. 3.  Vertebral artery system: Patent with antegrade flow bilaterally.  CTA Head and Neck 05/20/2022 1. When comparing across modalities to 2024 MRI head, no substantial change in a cavernous malformation in the left frontal white matter visible on the CT head. It is not well visualized on CTA, which is expected given cavernous malformations are "angiographically  occult." 2. No large vessel occlusion or proximal hemodynamically significant stenosis.   EEG 05/12/2022 This is a normal EEG recorded while drowsy and awake. No evidence of interictal epileptiform  discharges. Normal EEGs, however, do not rule out epilepsy.     ASSESSMENT AND PLAN  68 y.o. year old female with history of left frontal cavernoma, hypertension, hyperlipidemia, who is presenting presenting for follow-up, she has not had any additional episode of slurred speech or word-finding difficulty.  Reports of occasional dizziness but no falls.  Her routine EEG was normal.  CTA did not show any other vascular malformation.  Patient will continue to follow-up with PCP, I will repeat MRI brain in a year.  Advised her to go to the hospital if she experiences any severe headaches, seizures, word finding difficulty, slurred speech or any new right-sided focal neurological deficit.  She voiced understanding.   1. Cerebral cavernoma      Patient Instructions  Continue current medications Continue to follow with PCP Follow-up in 1 year, at that time we will repeat MRI brain Call EMS if you experience any severe headaches, word finding difficulty, seizures, new right-sided deficits including weakness numbness or tingling.   No orders of the defined types were placed in this encounter.   No orders of the defined types were placed in this encounter.   Return in about 1 year (around 04/30/2024).    Cassandra Cleveland, MD 05/01/2023, 11:50 AM  [facility] [address] [place], [place] [address] [phone]

## 2023-05-01 NOTE — Patient Instructions (Addendum)
 Continue current medications Continue to follow with PCP Follow-up in 1 year, at that time we will repeat MRI brain Call EMS if you experience any severe headaches, word finding difficulty, seizures, new right-sided deficits including weakness numbness or tingling.

## 2023-05-15 ENCOUNTER — Ambulatory Visit (HOSPITAL_COMMUNITY)

## 2023-05-17 ENCOUNTER — Other Ambulatory Visit (HOSPITAL_COMMUNITY)

## 2023-05-17 ENCOUNTER — Ambulatory Visit (HOSPITAL_COMMUNITY)

## 2023-07-10 ENCOUNTER — Ambulatory Visit (HOSPITAL_COMMUNITY)
Admission: RE | Admit: 2023-07-10 | Discharge: 2023-07-10 | Disposition: A | Discharge status: Home / Self Care | Source: Ambulatory Visit | Attending: Family Medicine | Admitting: Family Medicine

## 2023-07-10 ENCOUNTER — Ambulatory Visit: Payer: Self-pay | Admitting: Family Medicine

## 2023-07-10 DIAGNOSIS — M8588 Other specified disorders of bone density and structure, other site: Secondary | ICD-10-CM | POA: Diagnosis not present

## 2023-07-10 DIAGNOSIS — Z780 Asymptomatic menopausal state: Secondary | ICD-10-CM | POA: Insufficient documentation

## 2023-07-10 DIAGNOSIS — M8580 Other specified disorders of bone density and structure, unspecified site: Secondary | ICD-10-CM | POA: Diagnosis not present

## 2023-07-10 DIAGNOSIS — Z1231 Encounter for screening mammogram for malignant neoplasm of breast: Secondary | ICD-10-CM | POA: Insufficient documentation

## 2023-08-30 ENCOUNTER — Ambulatory Visit: Payer: PPO | Admitting: Family Medicine

## 2023-08-30 ENCOUNTER — Encounter: Payer: Self-pay | Admitting: Family Medicine

## 2023-08-30 VITALS — BP 150/83 | Temp 98.1°F | Ht 64.0 in | Wt 185.0 lb

## 2023-08-30 DIAGNOSIS — I10 Essential (primary) hypertension: Secondary | ICD-10-CM | POA: Diagnosis not present

## 2023-08-30 DIAGNOSIS — E785 Hyperlipidemia, unspecified: Secondary | ICD-10-CM

## 2023-08-30 DIAGNOSIS — F419 Anxiety disorder, unspecified: Secondary | ICD-10-CM

## 2023-08-30 NOTE — Assessment & Plan Note (Signed)
 BP mildly elevated here today.  Patient previously on amlodipine .  No current pharmacotherapy.  Advised to monitor blood pressure closely at home.  If remains elevated we will have to restart pharmacotherapy.

## 2023-08-30 NOTE — Patient Instructions (Signed)
 Follow up in 6 months.  Continue your medications.  Take care  Dr. Bluford

## 2023-08-30 NOTE — Progress Notes (Signed)
 Subjective:  Patient ID: Kristin [name], female    DOB: Jan 12, 1956  Age: 68 y.o. MRN: [id number]  CC:   Chief Complaint  Patient presents with   Follow-up    6 month f/u htn, hyperlipidemia, anxiety     HPI:  68 year old female presents for follow up.  Patient is doing well overall.  She does note that she has had very mild paresthesias of the forearms and hands.  She states that this is not troublesome.  No weakness.  She does not want any intervention at this time.  Anxiety stable on Celexa .  Lipids well-controlled on Crestor .  Patient said no additional episodes of transient alteration of awareness.  She is followed by neurology.  Cerebral cavernoma stable.  BP mildly elevated here today.  Advised to closely at home.  Patient Active Problem List   Diagnosis Date Noted   Transient alteration of awareness 08/30/2022   Cerebral cavernoma 02/09/2021   Osteopenia 04/06/2016   Anxiety 03/09/2015   Hyperlipidemia 06/21/2012   HTN (hypertension) 05/14/2012   History of colon cancer 09/02/2010    Social Hx   Social History   Socioeconomic History   Marital status: Married    Spouse name: Not on file   Number of children: Not on file   Years of education: Not on file   Highest education level: 12th grade  Occupational History   Not on file  Tobacco Use   Smoking status: Never   Smokeless tobacco: Never  Vaping Use   Vaping status: Never Used  Substance and Sexual Activity   Alcohol use: No   Drug use: No   Sexual activity: Yes  Other Topics Concern   Not on file  Social History Narrative   Not on file   Social Drivers of Health   Financial Resource Strain: Low Risk  (08/26/2023)   Overall Financial Resource Strain (CARDIA)    Difficulty of Paying Living Expenses: Not hard at all  Food Insecurity: No Food Insecurity (08/26/2023)   Hunger Vital Sign    Worried About Running Out of Food in the Last Year: Never true    Ran Out of Food in the Last Year: Never  true  Transportation Needs: No Transportation Needs (08/26/2023)   PRAPARE - Administrator, Civil Service (Medical): No    Lack of Transportation (Non-Medical): No  Physical Activity: Insufficiently Active (08/26/2023)   Exercise Vital Sign    Days of Exercise per Week: 2 days    Minutes of Exercise per Session: 20 min  Stress: No Stress Concern Present (08/26/2023)   [employer] of Occupational Health - Occupational Stress Questionnaire    Feeling of Stress: Only a little  Social Connections: Socially Integrated (08/26/2023)   Social Connection and Isolation Panel    Frequency of Communication with Friends and Family: More than three times a week    Frequency of Social Gatherings with Friends and Family: More than three times a week    Attends Religious Services: More than 4 times per year    Active Member of [employer] or Organizations: Yes    Attends Engineer, structural: More than 4 times per year    Marital Status: Married    Review of Systems Per HPI  Objective:  BP (!) 150/83   Temp 98.1 F (36.7 C)   Ht 5' 4 (1.626 m)   Wt 185 lb (83.9 kg)   SpO2 94%   BMI 31.76 kg/m  08/30/2023    9:18 AM 08/30/2023    8:43 AM 05/01/2023   11:10 AM  BP/Weight  Systolic BP 150 142 169  Diastolic BP 83 87 104  Wt. (Lbs)  185 184  BMI  31.76 kg/m2 31.58 kg/m2    Physical Exam Vitals and nursing note reviewed.  Constitutional:      General: She is not in acute distress.    Appearance: Normal appearance.  Cardiovascular:     Rate and Rhythm: Normal rate and regular rhythm.  Pulmonary:     Effort: Pulmonary effort is normal.     Breath sounds: Normal breath sounds. No wheezing or rales.  Neurological:     Mental Status: She is alert.  Psychiatric:        Mood and Affect: Mood normal.        Behavior: Behavior normal.     Lab Results  Component Value Date   WBC 5.0 04/21/2023   HGB 13.8 04/21/2023   HCT 42.6 04/21/2023   PLT 202 04/21/2023    GLUCOSE 74 04/21/2023   CHOL 149 04/21/2023   TRIG 69 04/21/2023   HDL 61 04/21/2023   LDLCALC 74 04/21/2023   ALT 18 04/21/2023   AST 24 04/21/2023   NA 139 04/21/2023   K 5.3 (H) 04/21/2023   CL 104 04/21/2023   CREATININE 0.77 04/21/2023   BUN 13 04/21/2023   CO2 22 04/21/2023   TSH 3.270 01/23/2020   HGBA1C 5.8 (H) 04/21/2023     Assessment & Plan:  Primary hypertension Assessment & Plan: BP mildly elevated here today.  Patient previously on amlodipine .  No current pharmacotherapy.  Advised to monitor blood pressure closely at home.  If remains elevated we will have to restart pharmacotherapy.   Hyperlipidemia, unspecified hyperlipidemia type Assessment & Plan: Well-controlled.  Continue Crestor .   Anxiety Assessment & Plan: Stable on Celexa .  Continue.     Follow-up: 6 months  Doretta Remmert Bluford DO [facility]

## 2023-08-30 NOTE — Assessment & Plan Note (Signed)
 Stable on Celexa .  Continue.

## 2023-08-30 NOTE — Assessment & Plan Note (Signed)
Well controlled. Continue Crestor. 

## 2023-11-14 ENCOUNTER — Other Ambulatory Visit: Payer: Self-pay | Admitting: Family Medicine

## 2023-11-14 DIAGNOSIS — F419 Anxiety disorder, unspecified: Secondary | ICD-10-CM

## 2023-12-08 ENCOUNTER — Ambulatory Visit: Payer: Medicare Other

## 2023-12-08 VITALS — Ht 64.0 in | Wt 185.0 lb

## 2023-12-08 DIAGNOSIS — Z0000 Encounter for general adult medical examination without abnormal findings: Secondary | ICD-10-CM | POA: Diagnosis not present

## 2023-12-08 NOTE — Patient Instructions (Signed)
 Kristin [name],  Thank you for taking the time for your Medicare Wellness Visit. I appreciate your continued commitment to your health goals. Please review the care plan we discussed, and feel free to reach out if I can assist you further.  Please note that Annual Wellness Visits do not include a physical exam. Some assessments may be limited, especially if the visit was conducted virtually. If needed, we may recommend an in-person follow-up with your provider.  Ongoing Care Seeing your primary care provider every 3 to 6 months helps us  monitor your health and provide consistent, personalized care.   Referrals If a referral was made during today's visit and you haven't received any updates within two weeks, please contact the referred provider directly to check on the status.  Recommended Screenings:  Health Maintenance  Topic Date Due   Zoster (Shingles) Vaccine (1 of 2) Never done   Flu Shot  08/18/2023   Breast Cancer Screening  07/09/2024   Medicare Annual Wellness Visit  12/07/2024   Colon Cancer Screening  03/04/2026   DTaP/Tdap/Td vaccine (2 - Td or Tdap) 01/03/2028   Pneumococcal Vaccine for age over 15  Completed   Osteoporosis screening with Bone Density Scan  Completed   Hepatitis C Screening  Completed   Meningitis B Vaccine  Aged Out   COVID-19 Vaccine  Discontinued       12/04/2023    9:12 AM  Advanced Directives  Does Patient Have a Medical Advance Directive? No  Would patient like information on creating a medical advance directive? Yes (MAU/Ambulatory/Procedural Areas - Information given)   Information on Advanced Care Planning can be found at [place]  Secretary of [facility] Advance Health Care Directives Advance Health Care Directives ([email or web address]    Vision: Annual vision screenings are recommended for early detection of glaucoma, cataracts, and diabetic retinopathy. These exams can also reveal signs of chronic conditions such as diabetes and high blood  pressure.  Dental: Annual dental screenings help detect early signs of oral cancer, gum disease, and other conditions linked to overall health, including heart disease and diabetes.  Please see the attached documents for additional preventive care recommendations.

## 2023-12-08 NOTE — Progress Notes (Signed)
 Chief Complaint  Patient presents with   Medicare Wellness     Subjective:   Kristin [name] is a 68 y.o. female who presents for a Medicare Annual Wellness Visit.  Allergies (verified) Keflex  [cephalexin ]   History: Past Medical History:  Diagnosis Date   Adenocarcinoma of colon (HCC) 2008   Allergy Seasonal   Anemia    Anxiety    Cavernoma 04/2012   Complication of anesthesia    need childsized tube to go down my throat/dr in 2008 (05/14/2012)   Hyperlipidemia    Hyperparathyroidism 01/2012   Hypertension 05/14/2012   hx of being on hypertensive meds off meds since 2017    Lyme disease 2010   Peripheral vascular disease    varcisoe veins in left leg    Stroke [facility]) 2013    [place] - had since birth     Past Surgical History:  Procedure Laterality Date   COLON RESECTION  01/17/2006   COLON SURGERY     COLONOSCOPY  10/12/2010   Procedure: COLONOSCOPY;  Surgeon: Oneil DELENA Budge;  Location: [facility];  Service: Gastroenterology;  Laterality: N/A;   COLONOSCOPY N/A 02/23/2016   Procedure: COLONOSCOPY;  Surgeon: Oneil Budge, MD;  Location: [facility];  Service: Gastroenterology;  Laterality: N/A;   COLONOSCOPY WITH PROPOFOL  N/A 03/04/2021   Procedure: COLONOSCOPY WITH PROPOFOL ;  Surgeon: Budge Oneil, MD;  Location: [facility];  Service: Gastroenterology;  Laterality: N/A;  OK per Melanie   EYE SURGERY     FACIAL COSMETIC SURGERY     eyelids   PARATHYROIDECTOMY Right 03/30/2017   Procedure: RIGHT INFERIOR PARATHYROIDECTOMY, BIOPSY OF RIGHT SUPERIOR PARATHYROIDECTIMY;  Surgeon: Eletha Boas, MD;  Location: [facility];  Service: General;  Laterality: Right;   WISDOM TOOTH EXTRACTION  09/18/1998   2 (05/14/2012)   Family History  Problem Relation Age of Onset   Cancer Maternal Grandfather    Hypertension Mother    Obesity Mother    Varicose Veins Mother    Heart attack Father    Vision loss Paternal Grandfather    Cancer Sister    Obesity Sister     Varicose Veins Maternal Aunt    Social History   Occupational History   Not on file  Tobacco Use   Smoking status: Never   Smokeless tobacco: Never  Vaping Use   Vaping status: Never Used  Substance and Sexual Activity   Alcohol use: Never   Drug use: Never   Sexual activity: Not Currently    Birth control/protection: Post-menopausal   Tobacco Counseling Counseling given: Not Answered  SDOH Screenings   Food Insecurity: No Food Insecurity (12/04/2023)  Housing: Low Risk  (12/04/2023)  Transportation Needs: No Transportation Needs (12/04/2023)  Utilities: Not At Risk (12/08/2023)  Alcohol Screen: Low Risk  (12/01/2022)  Depression (PHQ2-9): Low Risk  (12/08/2023)  Financial Resource Strain: Low Risk  (12/04/2023)  Physical Activity: Insufficiently Active (12/04/2023)  Social Connections: Socially Integrated (12/04/2023)  Stress: No Stress Concern Present (12/04/2023)  Tobacco Use: Low Risk  (12/08/2023)  Health Literacy: Adequate Health Literacy (12/08/2023)   See flowsheets for full screening details  Depression Screen PHQ 2 & 9 Depression Scale- Over the past 2 weeks, how often have you been bothered by any of the following problems? Little interest or pleasure in doing things: 0 Feeling down, depressed, or hopeless (PHQ Adolescent also includes...irritable): 0 PHQ-2 Total Score: 0 Trouble falling or staying asleep, or sleeping too much: 0 Feeling tired or having little  energy: 0 Poor appetite or overeating (PHQ Adolescent also includes...weight loss): 0 Feeling bad about yourself - or that you are a failure or have let yourself or your family down: 0 Trouble concentrating on things, such as reading the newspaper or watching television (PHQ Adolescent also includes...like school work): 0 Moving or speaking so slowly that other people could have noticed. Or the opposite - being so fidgety or restless that you have been moving around a lot more than usual: 0 Thoughts  that you would be better off dead, or of hurting yourself in some way: 0 PHQ-9 Total Score: 0 If you checked off any problems, how difficult have these problems made it for you to do your work, take care of things at home, or get along with other people?: Not difficult at all     Goals Addressed             This Visit's Progress    DIET - EAT MORE FRUITS AND VEGETABLES   On track      Visit info / Clinical Intake: Medicare Wellness Visit Type:: Subsequent Annual Wellness Visit Persons participating in visit:: patient Medicare Wellness Visit Mode:: Telephone If telephone:: video declined Because this visit was a virtual/telehealth visit:: vitals recorded from last visit If Telephone or Video please confirm:: The patient expressed understanding and agreed to proceed; I connected with the patient using audio enabled telemedicine application and verified that I am speaking with the correct person using two identifiers; I discussed the limitations of evaluation and management by telemedicine Patient Location:: home Provider Location:: office Information given by:: patient Interpreter Needed?: No Pre-visit prep was completed: yes AWV questionnaire completed by patient prior to visit?: yes Date:: 12/04/23 Living arrangements:: lives with spouse/significant other Patient's Overall Health Status Rating: very good Typical amount of pain: some Does pain affect daily life?: no Are you currently prescribed opioids?: no  Dietary Habits and Nutritional Risks How many meals a day?: 3 Eats fruit and vegetables daily?: yes Most meals are obtained by: preparing own meals Diabetic:: no  Functional Status Activities of Daily Living (to include ambulation/medication): (Patient-Rptd) Independent Ambulation: (Patient-Rptd) Independent Medication Administration: Independent Home Management: (Patient-Rptd) Independent Manage your own finances?: yes Primary transportation is: driving Concerns  about vision?: no *vision screening is required for WTM* Concerns about hearing?: no  Fall Screening Falls in the past year?: (Patient-Rptd) 1 Number of falls in past year: (Patient-Rptd) 0 Was there an injury with Fall?: (Patient-Rptd) 0 Fall Risk Category Calculator: (Patient-Rptd) 1 Patient Fall Risk Level: (Patient-Rptd) Low Fall Risk  Fall Risk Patient at Risk for Falls Due to: History of fall(s) Fall risk Follow up: Falls evaluation completed; Education provided; Falls prevention discussed  Home and Transportation Safety: All rugs have non-skid backing?: yes All stairs or steps have railings?: yes Grab bars in the bathtub or shower?: yes Have non-skid surface in bathtub or shower?: yes Good home lighting?: yes Regular seat belt use?: yes Hospital stays in the last year:: no  Cognitive Assessment Difficulty concentrating, remembering, or making decisions? : no Will 6CIT or Mini Cog be Completed: no 6CIT or Mini Cog Declined: patient alert, oriented, able to answer questions appropriately and recall recent events  Advance Directives (For Healthcare) Does Patient Have a Medical Advance Directive?: No Would patient like information on creating a medical advance directive?: Yes (MAU/Ambulatory/Procedural Areas - Information given)  Reviewed/Updated  Reviewed/Updated: Reviewed All (Medical, Surgical, Family, Medications, Allergies, Care Teams, Patient Goals)  Objective:    Today's Vitals   12/08/23 1151  Weight: 185 lb (83.9 kg)  Height: 5' 4 (1.626 m)   Body mass index is 31.76 kg/m.  Current Medications (verified) Outpatient Encounter Medications as of 12/08/2023  Medication Sig   cholecalciferol (VITAMIN D3) 25 MCG (1000 UNIT) tablet Take 1,000 Units by mouth daily.   citalopram  (CELEXA ) 20 MG tablet TAKE 1 TABLET(20 MG) BY MOUTH DAILY   Ketotifen Fumarate (ALLERGY EYE DROPS OP) Place 1 drop into both eyes daily as needed (allergies).   rosuvastatin   (CRESTOR ) 20 MG tablet TAKE 1 TABLET BY MOUTH EVERY DAY   vitamin B-12 (CYANOCOBALAMIN) 1000 MCG tablet Take 1,000 mcg by mouth 2 (two) times daily.   No facility-administered encounter medications on file as of 12/08/2023.   Hearing/Vision screen Hearing Screening - Comments:: Patient is able to hear conversational tones without difficulty. No issues reported.   Vision Screening - Comments:: No vision problems; will schedule routine eye exam  Immunizations and Health Maintenance Health Maintenance  Topic Date Due   Zoster Vaccines- Shingrix (1 of 2) Never done   Influenza Vaccine  08/18/2023   Mammogram  07/09/2024   Medicare Annual Wellness (AWV)  12/07/2024   Colonoscopy  03/04/2026   DTaP/Tdap/Td (2 - Td or Tdap) 01/03/2028   Pneumococcal Vaccine: 50+ Years  Completed   Bone Density Scan  Completed   Hepatitis C Screening  Completed   Meningococcal B Vaccine  Aged Out   COVID-19 Vaccine  Discontinued        Assessment/Plan:  This is a routine wellness examination for Kristin [name].  Patient Care Team: Cook, Jayce G, DO as PCP - General (Family Medicine) Gregg Lek, MD as Consulting Physician (Neurology)  I have personally reviewed and noted the following in the patient's chart:   Medical and social history Use of alcohol, tobacco or illicit drugs  Current medications and supplements including opioid prescriptions. Functional ability and status Nutritional status Physical activity Advanced directives List of other physicians Hospitalizations, surgeries, and ER visits in previous 12 months Vitals Screenings to include cognitive, depression, and falls Referrals and appointments  No orders of the defined types were placed in this encounter.  In addition, I have reviewed and discussed with patient certain preventive protocols, quality metrics, and best practice recommendations. A written personalized care plan for preventive services as well as general preventive health  recommendations were provided to patient.   Kristin Charmaine Browner, LPN   [id number]   Return in 1 year (on 12/07/2024).  After Visit Summary: (MyChart) Due to this being a telephonic visit, the after visit summary with patients personalized plan was offered to patient via MyChart   Nurse Notes: No concerns at this time

## 2024-03-01 ENCOUNTER — Ambulatory Visit: Admitting: Family Medicine

## 2024-04-30 ENCOUNTER — Ambulatory Visit: Admitting: Neurology

## 2025-01-16 ENCOUNTER — Ambulatory Visit
